# Patient Record
Sex: Female | Born: 1950 | Race: White | Hispanic: No | Marital: Married | Smoking: Never smoker
Health system: Southern US, Community
[De-identification: ages and names within clinical notes are randomized; demographics above are authoritative.]

## PROBLEM LIST (undated history)

## (undated) DIAGNOSIS — N950 Postmenopausal bleeding: Secondary | ICD-10-CM

## (undated) DIAGNOSIS — G40909 Epilepsy, unspecified, not intractable, without status epilepticus: Secondary | ICD-10-CM

## (undated) DIAGNOSIS — S7291XA Unspecified fracture of right femur, initial encounter for closed fracture: Secondary | ICD-10-CM

## (undated) DIAGNOSIS — F4323 Adjustment disorder with mixed anxiety and depressed mood: Secondary | ICD-10-CM

## (undated) DIAGNOSIS — Z9189 Other specified personal risk factors, not elsewhere classified: Secondary | ICD-10-CM

## (undated) DIAGNOSIS — H8101 Meniere's disease, right ear: Secondary | ICD-10-CM

## (undated) DIAGNOSIS — H269 Unspecified cataract: Secondary | ICD-10-CM

## (undated) DIAGNOSIS — R899 Unspecified abnormal finding in specimens from other organs, systems and tissues: Secondary | ICD-10-CM

## (undated) DIAGNOSIS — Z860101 Personal history of adenomatous and serrated colon polyps: Secondary | ICD-10-CM

## (undated) DIAGNOSIS — N9489 Other specified conditions associated with female genital organs and menstrual cycle: Secondary | ICD-10-CM

## (undated) DIAGNOSIS — H8109 Meniere's disease, unspecified ear: Secondary | ICD-10-CM

## (undated) DIAGNOSIS — M1990 Unspecified osteoarthritis, unspecified site: Secondary | ICD-10-CM

## (undated) DIAGNOSIS — Z86010 Personal history of colonic polyps: Secondary | ICD-10-CM

## (undated) DIAGNOSIS — Z9889 Other specified postprocedural states: Secondary | ICD-10-CM

## (undated) DIAGNOSIS — D219 Benign neoplasm of connective and other soft tissue, unspecified: Secondary | ICD-10-CM

## (undated) HISTORY — PX: BREAST CYST EXCISION: SHX579

## (undated) HISTORY — PX: COLONOSCOPY: SHX174

## (undated) HISTORY — PX: POLYPECTOMY: SHX149

## (undated) HISTORY — DX: Other specified personal risk factors, not elsewhere classified: Z91.89

## (undated) HISTORY — DX: Unspecified cataract: H26.9

## (undated) HISTORY — DX: Other hemochromatosis: E83.118

## (undated) HISTORY — DX: Personal history of colonic polyps: Z86.010

## (undated) HISTORY — PX: LIVER BIOPSY: SHX301

## (undated) HISTORY — PX: OTHER SURGICAL HISTORY: SHX169

## (undated) HISTORY — DX: Epilepsy, unspecified, not intractable, without status epilepticus: G40.909

## (undated) HISTORY — DX: Unspecified abnormal finding in specimens from other organs, systems and tissues: R89.9

## (undated) HISTORY — DX: Unspecified fracture of right femur, initial encounter for closed fracture: S72.91XA

## (undated) HISTORY — DX: Unspecified osteoarthritis, unspecified site: M19.90

## (undated) HISTORY — PX: OVARIAN CYST REMOVAL: SHX89

## (undated) HISTORY — DX: Adjustment disorder with mixed anxiety and depressed mood: F43.23

## (undated) HISTORY — DX: Gilbert syndrome: E80.4

## (undated) HISTORY — DX: Other specified postprocedural states: Z98.890

## (undated) HISTORY — DX: Benign neoplasm of connective and other soft tissue, unspecified: D21.9

## (undated) HISTORY — DX: Personal history of adenomatous and serrated colon polyps: Z86.0101

---
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
## 1990-06-09 ENCOUNTER — Encounter: Payer: Self-pay | Admitting: Internal Medicine

## 1990-06-18 ENCOUNTER — Encounter: Payer: Self-pay | Admitting: Internal Medicine

## 2000-07-17 ENCOUNTER — Encounter: Payer: Self-pay | Admitting: Internal Medicine

## 2001-01-21 DIAGNOSIS — S7291XA Unspecified fracture of right femur, initial encounter for closed fracture: Secondary | ICD-10-CM

## 2001-01-21 HISTORY — DX: Unspecified fracture of right femur, initial encounter for closed fracture: S72.91XA

## 2001-06-25 ENCOUNTER — Encounter: Payer: Self-pay | Admitting: Internal Medicine

## 2003-01-11 ENCOUNTER — Encounter: Payer: Self-pay | Admitting: Internal Medicine

## 2003-08-31 ENCOUNTER — Encounter: Payer: Self-pay | Admitting: Internal Medicine

## 2003-12-13 ENCOUNTER — Encounter: Payer: Self-pay | Admitting: Internal Medicine

## 2003-12-28 ENCOUNTER — Encounter: Payer: Self-pay | Admitting: Internal Medicine

## 2003-12-30 ENCOUNTER — Encounter: Payer: Self-pay | Admitting: Internal Medicine

## 2004-07-06 ENCOUNTER — Encounter: Payer: Self-pay | Admitting: Internal Medicine

## 2005-01-17 ENCOUNTER — Encounter: Payer: Self-pay | Admitting: Internal Medicine

## 2005-02-10 ENCOUNTER — Encounter: Payer: Self-pay | Admitting: Internal Medicine

## 2005-04-12 ENCOUNTER — Encounter: Payer: Self-pay | Admitting: Internal Medicine

## 2005-08-06 ENCOUNTER — Encounter: Payer: Self-pay | Admitting: Internal Medicine

## 2005-09-10 ENCOUNTER — Encounter: Payer: Self-pay | Admitting: Internal Medicine

## 2005-09-23 ENCOUNTER — Encounter: Payer: Self-pay | Admitting: Internal Medicine

## 2005-10-31 ENCOUNTER — Encounter: Payer: Self-pay | Admitting: Internal Medicine

## 2007-05-29 ENCOUNTER — Ambulatory Visit: Payer: Self-pay | Admitting: Internal Medicine

## 2007-05-29 DIAGNOSIS — K760 Fatty (change of) liver, not elsewhere classified: Secondary | ICD-10-CM | POA: Insufficient documentation

## 2007-05-29 DIAGNOSIS — N63 Unspecified lump in unspecified breast: Secondary | ICD-10-CM | POA: Insufficient documentation

## 2007-05-29 DIAGNOSIS — Z860100 Personal history of colon polyps, unspecified: Secondary | ICD-10-CM | POA: Insufficient documentation

## 2007-05-29 DIAGNOSIS — D1803 Hemangioma of intra-abdominal structures: Secondary | ICD-10-CM | POA: Insufficient documentation

## 2007-05-29 DIAGNOSIS — R945 Abnormal results of liver function studies: Secondary | ICD-10-CM | POA: Insufficient documentation

## 2007-05-29 DIAGNOSIS — D259 Leiomyoma of uterus, unspecified: Secondary | ICD-10-CM | POA: Insufficient documentation

## 2007-06-05 ENCOUNTER — Ambulatory Visit: Payer: Self-pay | Admitting: Family Medicine

## 2007-06-05 ENCOUNTER — Ambulatory Visit: Payer: Self-pay | Admitting: Internal Medicine

## 2007-06-05 LAB — CONVERTED CEMR LAB
Albumin: 4.3 g/dL (ref 3.5–5.2)
Alkaline Phosphatase: 85 units/L (ref 39–117)
BUN: 19 mg/dL (ref 6–23)
Basophils Relative: 0.6 % (ref 0.0–1.0)
Creatinine, Ser: 0.9 mg/dL (ref 0.4–1.2)
Direct LDL: 115.7 mg/dL
Eosinophils Relative: 2 % (ref 0.0–5.0)
GFR calc Af Amer: 83 mL/min
Glucose, Bld: 86 mg/dL (ref 70–99)
HCT: 37.8 % (ref 36.0–46.0)
Hemoglobin: 12.7 g/dL (ref 12.0–15.0)
INR: 0.9
MCV: 95.7 fL (ref 78.0–100.0)
Monocytes Absolute: 0.2 10*3/uL (ref 0.1–1.0)
Monocytes Relative: 6.2 % (ref 3.0–12.0)
Neutro Abs: 1.6 10*3/uL (ref 1.4–7.7)
Nitrite: NEGATIVE
Platelets: 187 10*3/uL (ref 150–400)
Potassium: 4.2 meq/L (ref 3.5–5.1)
Prothrombin Time: 11.6 s
RBC: 3.95 M/uL (ref 3.87–5.11)
Specific Gravity, Urine: 1.025
TSH: 0.88 microintl units/mL (ref 0.35–5.50)
Total CHOL/HDL Ratio: 2.8
Total Protein: 7.6 g/dL (ref 6.0–8.3)
Urobilinogen, UA: 0.2
WBC: 3.7 10*3/uL — ABNORMAL LOW (ref 4.5–10.5)

## 2007-06-09 ENCOUNTER — Telehealth: Payer: Self-pay | Admitting: *Deleted

## 2007-06-19 ENCOUNTER — Ambulatory Visit: Payer: Self-pay | Admitting: Internal Medicine

## 2007-06-19 ENCOUNTER — Other Ambulatory Visit: Admission: RE | Admit: 2007-06-19 | Discharge: 2007-06-19 | Payer: Self-pay | Admitting: Internal Medicine

## 2007-06-19 ENCOUNTER — Encounter: Admission: RE | Admit: 2007-06-19 | Discharge: 2007-06-19 | Payer: Self-pay | Admitting: Internal Medicine

## 2007-06-19 ENCOUNTER — Encounter: Payer: Self-pay | Admitting: Internal Medicine

## 2007-06-19 DIAGNOSIS — M810 Age-related osteoporosis without current pathological fracture: Secondary | ICD-10-CM | POA: Insufficient documentation

## 2007-06-26 ENCOUNTER — Encounter: Admission: RE | Admit: 2007-06-26 | Discharge: 2007-06-26 | Payer: Self-pay | Admitting: Internal Medicine

## 2007-07-13 ENCOUNTER — Telehealth: Payer: Self-pay | Admitting: Internal Medicine

## 2007-07-16 ENCOUNTER — Telehealth: Payer: Self-pay | Admitting: *Deleted

## 2007-08-03 ENCOUNTER — Ambulatory Visit: Payer: Self-pay | Admitting: Internal Medicine

## 2007-08-11 LAB — CONVERTED CEMR LAB
AFP-Tumor Marker: 15.3 ng/mL — ABNORMAL HIGH (ref 0.0–8.0)
Calcium, Total (PTH): 9.8 mg/dL (ref 8.4–10.5)

## 2007-08-17 ENCOUNTER — Encounter: Payer: Self-pay | Admitting: Internal Medicine

## 2007-10-26 ENCOUNTER — Ambulatory Visit: Payer: Self-pay | Admitting: Internal Medicine

## 2007-10-28 LAB — CONVERTED CEMR LAB
AFP-Tumor Marker: 17.3 ng/mL — ABNORMAL HIGH (ref 0.0–8.0)
Tissue Transglutaminase Ab, IgA: 0.1 U (ref <7)

## 2007-10-29 ENCOUNTER — Telehealth (INDEPENDENT_AMBULATORY_CARE_PROVIDER_SITE_OTHER): Payer: Self-pay

## 2007-11-06 ENCOUNTER — Ambulatory Visit (HOSPITAL_COMMUNITY): Admission: RE | Admit: 2007-11-06 | Discharge: 2007-11-06 | Payer: Self-pay | Admitting: Internal Medicine

## 2007-11-16 ENCOUNTER — Telehealth: Payer: Self-pay | Admitting: Internal Medicine

## 2008-01-22 HISTORY — PX: OTHER SURGICAL HISTORY: SHX169

## 2008-02-26 ENCOUNTER — Ambulatory Visit: Payer: Self-pay | Admitting: Internal Medicine

## 2008-02-26 DIAGNOSIS — R079 Chest pain, unspecified: Secondary | ICD-10-CM | POA: Insufficient documentation

## 2008-02-26 DIAGNOSIS — F4322 Adjustment disorder with anxiety: Secondary | ICD-10-CM | POA: Insufficient documentation

## 2008-02-26 DIAGNOSIS — D485 Neoplasm of uncertain behavior of skin: Secondary | ICD-10-CM | POA: Insufficient documentation

## 2008-04-25 ENCOUNTER — Telehealth: Payer: Self-pay | Admitting: Internal Medicine

## 2008-05-20 ENCOUNTER — Ambulatory Visit: Payer: Self-pay | Admitting: Internal Medicine

## 2008-05-20 LAB — CONVERTED CEMR LAB
Albumin: 4.3 g/dL (ref 3.5–5.2)
Alkaline Phosphatase: 77 units/L (ref 39–117)

## 2008-05-23 LAB — CONVERTED CEMR LAB: AFP-Tumor Marker: 14.6 ng/mL — ABNORMAL HIGH (ref 0.0–8.0)

## 2008-08-08 ENCOUNTER — Ambulatory Visit: Payer: Self-pay | Admitting: Internal Medicine

## 2008-08-19 ENCOUNTER — Encounter: Admission: RE | Admit: 2008-08-19 | Discharge: 2008-08-19 | Payer: Self-pay | Admitting: Internal Medicine

## 2008-08-26 ENCOUNTER — Encounter (INDEPENDENT_AMBULATORY_CARE_PROVIDER_SITE_OTHER): Payer: Self-pay

## 2008-09-02 ENCOUNTER — Telehealth: Payer: Self-pay | Admitting: Internal Medicine

## 2008-09-16 ENCOUNTER — Ambulatory Visit: Payer: Self-pay | Admitting: Internal Medicine

## 2008-09-16 ENCOUNTER — Observation Stay (HOSPITAL_COMMUNITY): Admission: AD | Admit: 2008-09-16 | Discharge: 2008-09-16 | Payer: Self-pay | Admitting: Internal Medicine

## 2008-09-16 ENCOUNTER — Encounter: Payer: Self-pay | Admitting: Emergency Medicine

## 2008-09-21 ENCOUNTER — Ambulatory Visit: Payer: Self-pay | Admitting: Internal Medicine

## 2008-09-21 DIAGNOSIS — M542 Cervicalgia: Secondary | ICD-10-CM | POA: Insufficient documentation

## 2008-09-28 LAB — CONVERTED CEMR LAB
Direct LDL: 137.8 mg/dL
Hgb A1c MFr Bld: 5.1 % (ref 4.6–6.5)
Total CHOL/HDL Ratio: 3

## 2008-10-07 ENCOUNTER — Ambulatory Visit: Payer: Self-pay | Admitting: Internal Medicine

## 2008-10-07 ENCOUNTER — Ambulatory Visit: Payer: Self-pay | Admitting: Cardiology

## 2008-10-07 DIAGNOSIS — E785 Hyperlipidemia, unspecified: Secondary | ICD-10-CM | POA: Insufficient documentation

## 2009-03-10 ENCOUNTER — Ambulatory Visit: Payer: Self-pay | Admitting: Internal Medicine

## 2009-04-14 ENCOUNTER — Telehealth: Payer: Self-pay | Admitting: Internal Medicine

## 2009-04-21 ENCOUNTER — Ambulatory Visit: Payer: Self-pay | Admitting: Internal Medicine

## 2009-04-21 LAB — CONVERTED CEMR LAB
Alkaline Phosphatase: 68 units/L (ref 39–117)
Bilirubin, Direct: 0.2 mg/dL (ref 0.0–0.3)
Total Bilirubin: 1.3 mg/dL — ABNORMAL HIGH (ref 0.3–1.2)
Total Protein: 7 g/dL (ref 6.0–8.3)

## 2009-05-03 ENCOUNTER — Ambulatory Visit: Payer: Self-pay | Admitting: Internal Medicine

## 2009-05-05 LAB — CONVERTED CEMR LAB
Chlamydia, Swab/Urine, PCR: NEGATIVE
GC Probe Amp, Urine: NEGATIVE

## 2009-06-09 ENCOUNTER — Encounter: Payer: Self-pay | Admitting: Internal Medicine

## 2009-07-05 ENCOUNTER — Ambulatory Visit: Payer: Self-pay | Admitting: Internal Medicine

## 2009-07-05 LAB — CONVERTED CEMR LAB
ALT: 27 units/L (ref 0–35)
Albumin: 4.9 g/dL (ref 3.5–5.2)
BUN: 21 mg/dL (ref 6–23)
Basophils Relative: 0.5 % (ref 0.0–3.0)
Bilirubin Urine: NEGATIVE
CO2: 31 meq/L (ref 19–32)
Chloride: 102 meq/L (ref 96–112)
Cholesterol: 232 mg/dL — ABNORMAL HIGH (ref 0–200)
Direct LDL: 121.8 mg/dL
Eosinophils Absolute: 0.1 10*3/uL (ref 0.0–0.7)
Eosinophils Relative: 2.1 % (ref 0.0–5.0)
Glucose, Urine, Semiquant: NEGATIVE
HCT: 37.4 % (ref 36.0–46.0)
Lymphs Abs: 1.6 10*3/uL (ref 0.7–4.0)
MCHC: 35.3 g/dL (ref 30.0–36.0)
MCV: 94.9 fL (ref 78.0–100.0)
Monocytes Absolute: 0.2 10*3/uL (ref 0.1–1.0)
Neutro Abs: 1.9 10*3/uL (ref 1.4–7.7)
Potassium: 4.8 meq/L (ref 3.5–5.1)
RBC: 3.94 M/uL (ref 3.87–5.11)
TSH: 0.97 microintl units/mL (ref 0.35–5.50)
Total CHOL/HDL Ratio: 3
Total Protein: 7.3 g/dL (ref 6.0–8.3)
VLDL: 13 mg/dL (ref 0.0–40.0)
WBC Urine, dipstick: NEGATIVE
WBC: 3.9 10*3/uL — ABNORMAL LOW (ref 4.5–10.5)
pH: 7

## 2009-07-11 ENCOUNTER — Other Ambulatory Visit: Admission: RE | Admit: 2009-07-11 | Discharge: 2009-07-11 | Payer: Self-pay | Admitting: Internal Medicine

## 2009-07-11 ENCOUNTER — Ambulatory Visit: Payer: Self-pay | Admitting: Internal Medicine

## 2009-07-11 DIAGNOSIS — R519 Headache, unspecified: Secondary | ICD-10-CM | POA: Insufficient documentation

## 2009-07-11 DIAGNOSIS — R51 Headache: Secondary | ICD-10-CM | POA: Insufficient documentation

## 2009-08-04 ENCOUNTER — Ambulatory Visit: Payer: Self-pay | Admitting: Internal Medicine

## 2009-08-04 ENCOUNTER — Encounter: Payer: Self-pay | Admitting: Internal Medicine

## 2009-09-08 ENCOUNTER — Encounter: Admission: RE | Admit: 2009-09-08 | Discharge: 2009-09-08 | Payer: Self-pay | Admitting: Internal Medicine

## 2009-11-08 ENCOUNTER — Ambulatory Visit: Payer: Self-pay | Admitting: Internal Medicine

## 2009-11-08 DIAGNOSIS — H9190 Unspecified hearing loss, unspecified ear: Secondary | ICD-10-CM | POA: Insufficient documentation

## 2009-11-09 ENCOUNTER — Encounter: Payer: Self-pay | Admitting: Internal Medicine

## 2009-11-20 ENCOUNTER — Encounter: Payer: Self-pay | Admitting: Internal Medicine

## 2010-02-10 ENCOUNTER — Encounter: Payer: Self-pay | Admitting: Otolaryngology

## 2010-02-20 NOTE — Progress Notes (Signed)
Summary: Does she need another lab?  Phone Note Call from Patient Call back at cell [phone]   Call For: Dr Leone Payor Summary of Call: Wonders if it's time for another lab for tumor marking. Her primary care asked her to call Dr Leone Payor to ask.  Wonders if we can call back today please? 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  Please advise Follow-up by: Darcey Nora RN, CGRN,  April 14, 2009 3:57 PM  Additional Follow-up for Phone Call Additional follow up Details #1::        she needs LFT's and alphafetoprotein dx 790.99 Iva Boop MD, [facility]  April 17, 2009 6:13 AM     Additional Follow-up for Phone Call Additional follow up Details #2::    Pt. will come for labs. Follow-up by: Teryl Lucy RN,  April 17, 2009 8:38 AM

## 2010-02-20 NOTE — Assessment & Plan Note (Signed)
Summary: EAR DISCOMFORT, H/A, TINNITUS // RS   Vital Signs:  Patient profile:   60 year old female Menstrual status:  postmenopausal Weight:      136 pounds Temp:     98.9 degrees F oral Pulse rate:   60 / minute BP sitting:   120 / 80  (left arm) Cuff size:   regular  Vitals Entered By: Romualdo Bolk, CMA (AAMA) (November 08, 2009 3:47 PM) CC: Pt is having rt ear ringing and hearing is distorted. This has been going on 10/12. Pt also have ha since 10/16. Pt has a knot on the bottom of her rt foot.  Hearing Screen  20db HL: Left  500 hz: 10db 1000 hz: 5db 2000 hz: 5db 4000 hz: 5db Right  500 hz: 55db 1000 hz: 45db 2000 hz: 10db 4000 hz: 10db   Hearing Testing Entered By: Romualdo Bolk, CMA Duncan Dull) (November 08, 2009 4:25 PM)   History of Present Illness: Nedra Mcinnis comes in today  for visit today  CO of  onset of significant decrease in hearing on right this week without fever  significant congestion or other obvious cause of popping in ears.   She states  she has a hs of sound sensisitivy and ocassinal pulse pain  in ear that is quite fleeting   x 2  and toff and on.   rx with tylenol and woudl resolve but in the past week has  ear felt funny and now has noise in ear tha is constant .  and distorted hearing .   like a white noise machine not piulatile  mostly constant. and hearing distorted  hard to use cell phone on the right ear . Marland Kitchen    Some HA for 3 days at times   .    better with coffee.  Noise  bothers her with quiet. .  Otherwise gets through her day. No balance problem or numbness   Preventive Screening-Counseling & Management  Alcohol-Tobacco     Alcohol drinks/day: <1     Smoking Status: never  Caffeine-Diet-Exercise     Caffeine use/day: 2     Does Patient Exercise: yes  Current Medications (verified): 1)  Fish Oil   Oil (Fish Oil) 2)  Oscal 500/200 D-3 500-200 Mg-Unit  Tabs (Calcium-Vitamin D) 3)  Alendronate Sodium 70 Mg Tabs (Alendronate  Sodium) .Marland Kitchen.. 1 By Mouth Q  Week   For Osteoporosis  Allergies (verified): 1)  ! Penicillin  Past History:  Past medical, surgical, family and social histories (including risk factors) reviewed, and no changes noted (except as noted below).  Past Medical History: Reviewed history from 07/11/2009 and no changes required. 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 C-Section  1982 Fracture R Femur 2003 running  out of shoes.    Fibroid Removed 1974 Spontaneously Abortion on 3rd Pg.  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 Family History of Colon CA 1st degree relative  father 28  died silent MI   Mom  with depresion anxiety  smoker   now dementia  high calcium   Brother ER  MD and has fatty liver and H63Dhemochromatosis  gene     Daughter also heterozygote for H63D  hemochromatosis, followed no symptom  for rest see data base  Mom deaf in one ear from Polio.  Father  had tinnitus   .      Social History: Reviewed history from 07/11/2009 and no changes required. Never Smoked Married   2 daughters separating   in counseling now.    husband a Rabbi moving  [place].    2 dogs  no tobacco  Alcohol use-yes ocasional 2-3 per month employed  [employer]   Daily Caffeine Use   Review of Systems  The patient denies anorexia, fever, weight loss, weight gain, vision loss, hoarseness, hemoptysis, abdominal pain, muscle weakness, transient blindness, difficulty  walking, abnormal bleeding, enlarged lymph nodes, and angioedema.    Physical Exam  General:  Well-developed,well-nourished,in no acute distress; alert,appropriate and cooperative throughout examination Head:  normocephalic and atraumatic.    no bruits hears Eyes:  vision grossly intact, pupils equal, and pupils round.   Ears:  L ear normal.  some wax in eac    right eac nl and tm appears normal  see hearing test    Nose:  no external deformity, no external erythema, and no nasal discharge.  non tender Mouth:  good dentition and pharynx pink and moist.   Neck:  No deformities, masses, or tenderness noted. no bruits  Lungs:  normal respiratory effort, no intercostal retractions, normal breath sounds, and no dullness.   Heart:  normal rate, regular rhythm, no murmur, no gallop, and no lifts.   Pulses:  pulses intact without delay   Neurologic:  non focal  except for hearing  facial symmetry good  Skin:  turgor normal, color normal, no suspicious lesions, and no ecchymoses.   Cervical Nodes:  No lymphadenopathy noted Psych:  Oriented X3, normally interactive, good eye contact, not anxious appearing, and not depressed appearing.     Impression & Recommendations:  Problem # 1:  DECREASED HEARING, RIGHT EAR (ICD-389.9) Assessment New recent onset  and sig on hearing screen compared to left .    needs ent evaluation  asap.    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  Orders: ENT Referral (ENT)  Complete Medication List: 1)  Fish Oil Oil (Fish oil) 2)  Oscal 500/200 D-3 500-200 Mg-unit Tabs (Calcium-vitamin d) 3)  Alendronate Sodium 70 Mg Tabs (Alendronate sodium) .Marland Kitchen.. 1 by mouth q  week   for osteoporosis  Patient Instructions: 1)  rec we get ent tocheck this as new onset    .  NO evidence of infection on todays exam or a vascular problem.  2)  Appt with Dr Jenne Pane tonmorrow 130   be there at 1:15  3)  copy of   hearing screen  to patient.   Orders Added: 1)  ENT Referral [ENT] 2)  Est. Patient Level IV [27782]

## 2010-02-20 NOTE — Therapy (Signed)
Summary: Audiology/Hearing Test  Audiology/Hearing Test   Imported By: Maryln Gottron 11/14/2009 09:40:19  _____________________________________________________________________  External Attachment:    Type:   Image     Comment:   External Document

## 2010-02-20 NOTE — Assessment & Plan Note (Signed)
Summary: med check//ccm   Vital Signs:  Patient profile:   60 year old female Menstrual status:  postmenopausal Weight:      135 pounds Temp:     99.0 degrees F oral Pulse rate:   72 / minute BP sitting:   120 / 80  (right arm) Cuff size:   regular  Vitals Entered By: Romualdo Bolk, CMA (AAMA) (March 10, 2009 1:47 PM) CC: Pt is here to discuss going on a antidepressant. Pt is going thru a divorce.   History of Present Illness: Donna Warren comes in today for above problem.   Separating  and family issues causing anxiety and depressive symptoms .  no panic attacks but impaired sleep and feeling stressed and depressed. Not hopeless or suicidal       . Tried  1/2 xanaxbut htis   gave her a HA   took x 1 .   in the  remote past  lexapro f had a side effect     an she felt " like cotton head.  "    Currently tends to be anxious and weepy at times  and thins she could benefit form medication    asks about paxil   .   to see a Veterinary surgeon .  Other hx.   Get has   in am     episodically this year.   Liver   ? follow up .  decided not to be [facility] but may still do this.   No Gi signs .     Preventive Screening-Counseling & Management  Alcohol-Tobacco     Alcohol drinks/day: <1     Smoking Status: never  Caffeine-Diet-Exercise     Caffeine use/day: 2     Does Patient Exercise: yes  Current Medications (verified): 1)  Fish Oil   Oil (Fish Oil) 2)  Oscal 500/200 D-3 500-200 Mg-Unit  Tabs (Calcium-Vitamin D) 3)  Alendronate Sodium 70 Mg Tabs (Alendronate Sodium) .Marland Kitchen.. 1 By Mouth Q  Week   For Osteoporosis  Allergies (verified): 1)  ! Penicillin  Past History:  Past medical, surgical, family and social histories (including risk factors) reviewed, and no changes noted (except as noted below).  Past Medical History: Reviewed history from 10/07/2008 and no changes required. Colonic polyps, adenoma per outside records (no path) Fatty liver  Heterozygosity for H63D  hemochromatosis gene Mild reactive depression  Uterine fibroids Osteoporosis dexa 2009 Gilbert's syndrome  Elelvated AFP  Hosp R/O  MI  CV  right chest and jaw pain  2010  Past Surgical History: Reviewed history from 10/07/2008 and no changes required. C-Section  1982 Fracture R Femur 2003 running  out of shoes.    Fibroid Removed 1974 Spontaneously Abortion on 3rd Pg.  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 Family History of Colon CA 1st degree relative  father 25  died silent MI   Mom  with depresion anxiety   smoker   now dementia  high calcium   Brother ER  MD and has fatty liver and H63Dhemochromatosis  gene     Daughter also heterozygote for H63D  hemochromatosis, followed no symptom  for rest see data base     Social History: Reviewed history from 02/26/2008 and no changes required. Never Smoked Married   2 daughters husband a Rabbi 2 dogs  no tobacco  Alcohol use-yes ocasional 2-3 per month employed  [employer]   Daily Caffeine Use   Review of Systems  The patient denies anorexia, fever, weight loss, weight gain, vision loss, abnormal bleeding, and enlarged lymph nodes.    Physical Exam  General:  alert, well-developed, well-nourished, and well-hydrated.   Head:  normocephalic.   Eyes:  vision grossly intact.   Skin:  turgor normal and color normal.   Psych:  Oriented X3, good eye contact, not anxious appearing, and not depressed appearing.  slighlty tearful at times.  Additional Exam:  reviewed record for last lfts afp    .    ? if April 2010   Impression & Recommendations:  Problem # 1:  ADJUSTMENT DISORDER WITH ANXIETY (ICD-309.24) counseled   and discuss   has  hx of se of lexapro in past and ? effexor .   thik paxil may have too many nurisance se.  will   use  sertraline  for now .  low dose increase as discussed.    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   she should contact Dr Teresita Madura office about getting repeat lft s and AFp and any other labs to follow .  Complete Medication List: 1)  Fish Oil Oil (Fish oil) 2)  Oscal 500/200 D-3 500-200 Mg-unit Tabs (Calcium-vitamin d) 3)  Alendronate Sodium 70 Mg Tabs (Alendronate sodium) .Marland Kitchen.. 1 by mouth q  week   for osteoporosis 4)  Sertraline Hcl 50 Mg Tabs (Sertraline hcl) .... 1/2 by mouth once daily for 1-2 weeks then increase to 1 by mouth once daily as tolerated  Patient Instructions: 1)  take med as directed   2)  rov in 3-4 weeks  or as needed.  3)  can arrange for any needed blood tests  per Dr Leone Payor here if calls.  Prescriptions: SERTRALINE HCL 50 MG TABS (SERTRALINE HCL) 1/2 by mouth once daily for 1-2 weeks then increase to 1 by mouth once daily as tolerated  #30 x 1   Entered and Authorized by:   Madelin Headings MD   Signed by:   Madelin Headings MD on 03/10/2009   Method used:   Electronically to        [id number] (retail)       [address]       [place], [place]  [address]       Ph: [phone] or [phone]       Fax: [phone]   RxID:   [id number]  greater than 50% of visit spent in counseling  25 minutes

## 2010-02-20 NOTE — Assessment & Plan Note (Signed)
Summary: CPX/PAP/NJR OK PER DOC/NJR   Vital Signs:  Patient profile:   60 year old female Menstrual status:  postmenopausal Height:      64 inches Weight:      136 pounds BMI:     23.43 Pulse rate:   72 / minute BP sitting:   100 / 60  (left arm) Cuff size:   regular  Vitals Entered By: Romualdo Bolk, CMA (AAMA) (July 11, 2009 10:40 AM) CC: CPX with pap   History of Present Illness: Donna Warren comes in today  for preventive visit . Since last visit  here  there have been no major changes in health status   She has seen specialist at [facility] opinion about her  isolated elevated AFP.     and because  stable  no monitoring needed. Weaned off sertraline.     and now off   for a month.  an dstill doing ok.   living separate in [place] counseling  stable. Bone  :   on fosomax . last dex 5 2009 . NO fractures .  Preventive Care Screening  Prior Values:    Mammogram:  ASSESSMENT: Negative - BI-RADS 1^MM DIGITAL SCREENING (08/19/2008)    Colonoscopy:  NORMAL  Indications: prior polyp (adenomatous? - no pathology), eldeerly father had colon cancer.  Dr. Levell July-  [facility]  [place] (08/[place])    Last Tetanus Booster:  Tdap (06/19/2007)   Preventive Screening-Counseling & Management  Alcohol-Tobacco     Alcohol drinks/day: <1     Smoking Status: never  Caffeine-Diet-Exercise     Caffeine use/day: 2     Does Patient Exercise: yes  Current Medications (verified): 1)  Fish Oil   Oil (Fish Oil) 2)  Oscal 500/200 D-3 500-200 Mg-Unit  Tabs (Calcium-Vitamin D) 3)  Alendronate Sodium 70 Mg Tabs (Alendronate Sodium) .Marland Kitchen.. 1 By Mouth Q  Week   For Osteoporosis  Allergies (verified): 1)  ! Penicillin  Past History:  Past medical, surgical, family and social histories (including risk factors) reviewed, and no changes noted (except as noted below).  Past Medical History: Colonic polyps, adenoma per outside records (no path) Fatty liver  Heterozygosity for H63D  hemochromatosis gene Mild reactive depression  Uterine fibroids Osteoporosis dexa 2009 -2.8  spine  -3.2 hi[ Gilbert's syndrome  Elelvated AFP  Hosp R/O  MI  CV  right chest and jaw pain  2010  Past Surgical History: Reviewed history from 10/07/2008 and no changes required. C-Section  1982 Fracture R Femur 2003 running  out of shoes.    Fibroid Removed 1974 Spontaneously Abortion on 3rd Pg.  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 Family History of Colon CA 1st degree relative  father 63  died silent MI   Mom  with depresion anxiety   smoker   now dementia  high calcium   Brother ER  MD and has fatty liver and H63Dhemochromatosis  gene     Daughter also heterozygote for H63D  hemochromatosis, followed no symptom  for rest see data base     Social History: Reviewed history from 05/03/2009 and no changes required. Never Smoked Married   2 daughters separating   in counseling now.    husband a Rabbi moving  [place].    2 dogs  no tobacco  Alcohol use-yes ocasional 2-3 per month  employed  [employer]   Daily Caffeine Use   Review of Systems  The patient denies anorexia, fever, weight loss, weight gain, vision loss, decreased hearing, hoarseness, chest pain, syncope, dyspnea on exertion, peripheral edema, prolonged cough, headaches, hemoptysis, abdominal pain, melena, hematochezia, severe indigestion/heartburn, hematuria, muscle weakness, suspicious skin lesions, transient blindness, difficulty walking, depression, unusual weight change, abnormal bleeding, enlarged lymph nodes, angioedema, and breast masses.         gets ocassional am had better after tylenol and her coffee ? upright . no assoicated .[place]  oncurring over 2 years .  no vision change or neuro assoicated signs    Physical Exam General Appearance: well developed, well nourished, no acute distress Eyes: conjunctiva and lids normal, PERRLA, EOMI, WNL Ears, Nose, Mouth, Throat: TM clear, nares clear, oral exam WNL Neck: supple, no lymphadenopathy, no thyromegaly, no JVD Respiratory: clear to auscultation and percussion, respiratory effort normal Cardiovascular: regular rate and rhythm, S1-S2, no murmur, rub or gallop, no bruits, peripheral pulses normal and symmetric, no cyanosis, clubbing, edema or varicosities Chest: no scars, masses, tenderness; no asymmetry, skin changes, nipple discharge   Gastrointestinal: soft, non-tender; no hepatosplenomegaly, masses; active bowel sounds all quadrants, guaiac negative stool; no masses, tenderness, hemorrhoids  Genitourinary: no vaginal discharge, lesions; no masses or tenderness Lymphatic: no cervical, axillary or inguinal adenopathy Musculoskeletal: gait normal, muscle tone and strength WNL, no joint swelling, effusions, discoloration, crepitus  Skin: clear, good turgor, color WNL, no rashes, lesions, or ulcerations Neurologic: normal mental status, normal reflexes, normal strength, sensation, and motion Psychiatric: alert; oriented to person, place and time Other Exam:  labs reviewed  wbc low normal rest nl     Impression & Recommendations:  Problem # 1:  PREVENTIVE HEALTH CARE (ICD-V70.0) Discussed nutrition,exercise,diet,healthy weight, vitamin D and calcium.    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 No further eval or monitoring needed unless change in symptom etc.  Problem # 4:  OSTEOPOROSIS (ICD-733.00) see dexa over 2 years ag rec follow up   disc  bisphosphates and uncertaintly of optimal rx length but she  has not been on  this 5 years yet.  and has fam hx and osteoporosis at a younger age.  Her updated medication list for this problem includes:    Oscal 500/200 D-3 500-200 Mg-unit Tabs (Calcium-vitamin d)    Alendronate Sodium 70 Mg Tabs (Alendronate sodium) .Marland Kitchen... 1 by mouth q  week   for osteoporosis  Problem # 5:  ADJUSTMENT DISORDER WITH ANXIETY (ICD-309.24) better  stable off med weaned for a month  ok to continue   Complete Medication List: 1)  Fish Oil Oil (Fish oil) 2)  Oscal 500/200 D-3 500-200 Mg-unit Tabs (Calcium-vitamin d) 3)  Alendronate Sodium 70 Mg Tabs (Alendronate sodium) .Marland Kitchen.. 1 by mouth q  week   for osteoporosis  Patient Instructions: 1)  DEXA scan. 2)  Stay on fosamax for now. 3)  yearly  check up or as needed.

## 2010-02-20 NOTE — Consult Note (Signed)
Summary: [facility]  [facility]   Imported By: Maryln Gottron 11/15/2009 10:09:31  _____________________________________________________________________  External Attachment:    Type:   Image     Comment:   External Document

## 2010-02-20 NOTE — Letter (Signed)
Summary: [facility]  [facility]   Imported By: Maryln Gottron 11/24/2009 12:30:40  _____________________________________________________________________  External Attachment:    Type:   Image     Comment:   External Document

## 2010-02-20 NOTE — Consult Note (Signed)
Summary: [facility]  [facility]   Imported By: Maryln Gottron 06/27/2009 14:00:24  _____________________________________________________________________  External Attachment:    Type:   Image     Comment:   External Document

## 2010-02-20 NOTE — Miscellaneous (Signed)
Summary: BONE DENSITY  Clinical Lists Changes  Orders: Added new Test order of T-Bone Densitometry (77080) - Signed Added new Test order of T-Lumbar Vertebral Assessment (77082) - Signed 

## 2010-02-20 NOTE — Assessment & Plan Note (Signed)
Summary: consults ZO:XWRU and sexual health/cjr   Vital Signs:  Patient profile:   60 year old female Menstrual status:  postmenopausal Weight:      131 pounds Pulse rate:   78 / minute BP sitting:   130 / 70  (right arm) Cuff size:   regular  Vitals Entered By: Romualdo Bolk, CMA (AAMA) (May 03, 2009 11:05 AM) CC: Follow-up visit on meds, discuss afp results and also discuss STD testing.   History of Present Illness: Donna Warren comesin today for   above.  AFP still about the same  and to get a [facility] consult  to get opinion. Mood zoloft dry mouth but seem to help.  seeing a counselor.      Husband  having affair.  She has no symptom but would like a screen to be safe .  Overall doing better  .   Preventive Screening-Counseling & Management  Alcohol-Tobacco     Alcohol drinks/day: <1     Smoking Status: never  Caffeine-Diet-Exercise     Caffeine use/day: 2     Does Patient Exercise: yes  Current Medications (verified): 1)  Fish Oil   Oil (Fish Oil) 2)  Oscal 500/200 D-3 500-200 Mg-Unit  Tabs (Calcium-Vitamin D) 3)  Alendronate Sodium 70 Mg Tabs (Alendronate Sodium) .Marland Kitchen.. 1 By Mouth Q  Week   For Osteoporosis 4)  Sertraline Hcl 50 Mg Tabs (Sertraline Hcl) .Marland Kitchen.. 1 By Mouth Once Daily As Tolerated  Allergies (verified): 1)  ! Penicillin  Past History:  Past medical, surgical, family and social histories (including risk factors) reviewed, and no changes noted (except as noted below).  Past Medical History: Reviewed history from 10/07/2008 and no changes required. 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 C-Section  1982 Fracture R Femur 2003 running  out of  shoes.    Fibroid Removed 1974 Spontaneously Abortion on 3rd Pg.  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 Family History of Colon CA 1st degree relative  father 64  died silent MI   Mom  with depresion anxiety   smoker   now dementia  high calcium   Brother ER  MD and has fatty liver and H63Dhemochromatosis  gene     Daughter also heterozygote for H63D  hemochromatosis, followed no symptom  for rest see data base     Social History: Reviewed history from 02/26/2008 and no changes required. 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  Orders: Prescription Created Electronically [phone])  Problem #  2:  SCREENING EXAMINATION FOR VENEREAL DISEASE (ICD-V74.5) counseled about  testing  Orders: T-HIV Antibody  (Reflex) [phone]) T-RPR (Syphilis) [phone]) T-Chlamydia & GC Probe, Urine ([phone])  Problem # 3:  ELEVATED ALPHA FETO PROTEIN LEVEL (ICD-790.99) Assessment: Unchanged  Complete Medication List: 1)  Fish Oil Oil (Fish oil) 2)  Oscal 500/200 D-3 500-200 Mg-unit Tabs (Calcium-vitamin d) 3)  Alendronate Sodium 70 Mg Tabs (Alendronate sodium) .Marland Kitchen.. 1 by mouth q  week   for osteoporosis 4)   Sertraline Hcl 50 Mg Tabs (Sertraline hcl) .Marland Kitchen.. 1 by mouth once daily as tolerated  Patient Instructions: 1)  schedule   cpx  in 1-2 months  with pap. 2)  ( can make a slot except thursday)  3)  You will be informed of lab results when available.  Prescriptions: SERTRALINE HCL 50 MG TABS (SERTRALINE HCL) 1 by mouth once daily as tolerated  #30 x 6   Entered and Authorized by:   Madelin Headings MD   Signed by:   Madelin Headings MD on 05/03/2009   Method used:   Electronically to        [id number] (retail)       [address]       [place], [place]  [address]       Ph: [phone] or [phone]       Fax: [phone]   RxID:   [id number]

## 2010-04-28 LAB — POCT CARDIAC MARKERS
CKMB, poc: <1 ng/mL — ABNORMAL LOW (ref 1.0–8.0)
Myoglobin, poc: 56.2 ng/mL (ref 12–200)
Troponin i, poc: <0.05 ng/mL (ref 0.00–0.09)
Troponin i, poc: <0.05 ng/mL (ref 0.00–0.09)

## 2010-04-28 LAB — AMYLASE: Amylase: 94 U/L (ref 27–131)

## 2010-04-28 LAB — D-DIMER, QUANTITATIVE: D-Dimer, Quant: <0.22 ug/mL-FEU (ref 0.00–0.48)

## 2010-04-28 LAB — LIPASE, BLOOD: Lipase: 32 U/L (ref 11–59)

## 2010-04-28 LAB — COMPREHENSIVE METABOLIC PANEL
ALT: 41 U/L — ABNORMAL HIGH (ref 0–35)
Alkaline Phosphatase: 73 U/L (ref 39–117)
BUN: 15 mg/dL (ref 6–23)
CO2: 26 mEq/L (ref 19–32)
Calcium: 9.4 mg/dL (ref 8.4–10.5)
GFR calc non Af Amer: >60 mL/min (ref >60)
Glucose, Bld: 124 mg/dL — ABNORMAL HIGH (ref 70–99)
Sodium: 140 mEq/L (ref 135–145)

## 2010-04-28 LAB — POCT I-STAT, CHEM 8
BUN: 24 mg/dL — ABNORMAL HIGH (ref 6–23)
Calcium, Ion: 1.17 mmol/L (ref 1.12–1.32)
Chloride: 104 mEq/L (ref 96–112)
Creatinine, Ser: 1.1 mg/dL (ref 0.4–1.2)
TCO2: 26 mmol/L (ref 0–100)

## 2010-04-28 LAB — DIFFERENTIAL
Basophils Absolute: 0 10*3/uL (ref 0.0–0.1)
Basophils Relative: 0 % (ref 0–1)
Eosinophils Absolute: 0.1 10*3/uL (ref 0.0–0.7)
Eosinophils Relative: 1 % (ref 0–5)
Lymphocytes Relative: 30 % (ref 12–46)
Monocytes Absolute: 0.3 10*3/uL (ref 0.1–1.0)

## 2010-04-28 LAB — CK TOTAL AND CKMB (NOT AT ARMC)
CK, MB: 1.1 ng/mL (ref 0.3–4.0)
Relative Index: INVALID (ref 0.0–2.5)
Total CK: 75 U/L (ref 7–177)

## 2010-04-28 LAB — CARDIAC PANEL(CRET KIN+CKTOT+MB+TROPI)
CK, MB: 0.8 ng/mL (ref 0.3–4.0)
Relative Index: INVALID (ref 0.0–2.5)
Total CK: 58 U/L (ref 7–177)

## 2010-04-28 LAB — CBC
MCHC: 34.6 g/dL (ref 30.0–36.0)
RBC: 3.89 MIL/uL (ref 3.87–5.11)
RDW: 12.4 % (ref 11.5–15.5)

## 2010-04-28 LAB — TSH: TSH: 2.425 u[IU]/mL (ref 0.350–4.500)

## 2010-05-01 ENCOUNTER — Telehealth: Payer: Self-pay | Admitting: *Deleted

## 2010-05-01 NOTE — Telephone Encounter (Signed)
Per Dr. Fabian Sharp- dramamine but can make her drowsy. And Imodium if needed. Pt aware of this and will call us if she doesn't get better before the weekend.

## 2010-05-01 NOTE — Telephone Encounter (Signed)
Pt is in [place] and had GI virus with V/D and fever.  Feeling some better, and has stopped vomiting, but still has nausea and some diarrhea. What is the best otc med she can take?  No fever now.

## 2010-06-05 NOTE — H&P (Signed)
Donna Warren, Donna Warren NO.:  [id number]   MEDICAL RECORD NO.:  [id number]          PATIENT TYPE:  EMS   LOCATION:  ED                           FACILITY:  [facility]   PHYSICIAN:  Ramiro Harvest, MD    DATE OF BIRTH:  Apr 27, 1950   DATE OF ADMISSION:  09/16/2008  DATE OF DISCHARGE:                              HISTORY & PHYSICAL   PRIMARY CARE PHYSICIAN:  Dr. Fabian Sharp of [facility].   HISTORY OF PRESENT ILLNESS:  Donna Warren is a 60 year old white female  with history of osteoporosis, elevated AFP, who presents to the ED with  a midsternal to right-sided chest pressure occurring at rest, lasting  approximately 2 hours and improving.  The patient stated that she ate  dinner and then after dinner she started experiencing reoccurrence of  the chest pain which radiated to her right neck/jaw and felt like a  toothache and also some right shoulder/right shoulder blade pain.  The  patient took two full-dose aspirin, called her brother who is an ED  doctor and also told him of her symptoms.  The patient stated that she  was just not feeling right and as such presented to the ED.  The patient  denies any fevers.  No chills, no diaphoresis, no shortness of breath,  no palpitations, no cough, no emesis, no abdominal pain, no diarrhea, no  orthopnea, no paroxysmal nocturnal dyspnea, no constipation.  No focal  neurological symptoms.  In the ED, the patient's pain has gradually  improved.  Labs done in the ED showed point of care cardiac markers were  negative x2.  I-Stat 8 showed a glucose of 151, BUN of 24, otherwise was  within normal limits.  D-dimer less than 0.22.  EKG with normal sinus  rhythm.  We were called to admit the patient for further evaluation and  management.   ALLERGIES:  PENICILLIN CAUSES A RASH.   PAST MEDICAL HISTORY:  1. Osteoporosis.  2. Elevated AFP being followed per Dr. Leone Payor.  3. Elevated liver function tests being followed per Dr.  Leone Payor.   HOME MEDICATIONS:  1. Fosamax 70 mg p.o. q. weekly.  2. Fish oil, dose unknown.  3. Calcium, dose unknown.   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  She is married.  No  tobacco use.  No alcohol use.  No IV drug use.   FAMILY HISTORY:  Father deceased at age 30 from colon cancer.  Mother  alive at age 55 with multiple medical problems, including coronary  artery disease, hypertension, thyroid disease, CVA, TIA, dementia,  diabetes and osteoarthritis.  The patient has one brother who also has  elevated liver function tests as well, otherwise he is healthy.   REVIEW OF SYSTEMS:  As per HPI, otherwise negative.   PHYSICAL EXAMINATION:  VITAL SIGNS:  Temperature 98.0, blood pressure  133/80, pulse of 85, respirations 22.  Saturating 100%.  GENERAL:  Patient in no apparent distress.  HEENT:  Normocephalic, atraumatic.  Pupils equal, round and reactive to  light and accommodation.  Extraocular movements intact.  Oropharynx  is  clear.  No lesions, no exudates.  NECK:  Supple.  No lymphadenopathy.  RESPIRATORY:  Lungs are clear to auscultation bilaterally.  No wheezes,  no crackles, no rhonchi.  CARDIOVASCULAR:  Regular rate and rhythm.  No  murmurs, rubs or gallops.  Chest wall is nontender to palpation.  ABDOMEN:  Soft, nontender, nondistended.  Positive bowel sounds.  EXTREMITIES:  No clubbing, cyanosis or edema.  NEUROLOGICAL:  The patient is alert and oriented x3.  Cranial nerves II-  XII are grossly intact.  No focal deficits.   ADMISSION LABORATORY DATA:  Point of care cardiac markers; CK-MB less  than 1.0, troponin-I less than 0.05, myoglobin of 47.5.  Next set showed  a CK-MB less than 1.0, troponin-I less than 0.05, myoglobin of 56.2.  I-  Stat 8 with a sodium of 140, potassium 3.8, chloride 104, glucose 151,  BUN 24, creatinine of 1.1.  CBC with a white count of 6.7, hemoglobin  12.8, hematocrit 37.0, platelet count of 208 and ANC of 4.3.  D-dimer  less  than 0.22.  Chest x-ray done showed no acute cardiopulmonary  process seen.  EKG shows a normal sinus rhythm.   ASSESSMENT AND PLAN:  [name] is a 60 year old female with a  history of osteoporosis, who presented to the emergency department with  chest pain.   1. Chest pain.  Differential includes acute coronary syndrome versus      gastrointestinal (cholecystitis).  D-dimer is negative and thus low      pretest probability for pulmonary edema.  The patient is low-risk.      Chest x-ray was negative for infiltrate.  We will admit the patient      to telemetry, cycle cardiac enzymes q.8 h., x3, check a TSH , check      a BNP, check a lipase, check amylase, check a 2-D echo, check a      CMET.  Blood pressure in both arms are within normal limits.  Blood      pressure in the left arm was 124/76, in the right and 120/75.      Dissection is also less likely in a patient with normal blood      pressures, will follow.  May need a cardiology consult for further      evaluation and recommendations and inpatient versus outpatient      stress test.  We will continue home dose oxygen, aspirin, morphine      sulfate and nitroglycerin.  2.  Osteoporosis, stable.  2. Prophylaxis.  Protonix for gastrointestinal prophylaxis.  Lovenox      for deep venous thrombosis prophylaxis.   It has been a pleasure taking care of Ms. Donna Warren.      Ramiro Harvest, MD  Electronically Signed     DT/MEDQ  D:  09/16/2008  T:  09/16/2008  Job:  [id number]   cc:   Neta Mends. Fabian Sharp, MD  [address] [place], [place] [address]

## 2010-06-05 NOTE — Discharge Summary (Signed)
Donna Warren, Donna Warren                ACCOUNT NO.:  [id number]   MEDICAL RECORD NO.:  [id number]          PATIENT TYPE:  OBV   LOCATION:  3709                         FACILITY:  [facility]   PHYSICIAN:  Rosalyn Gess. Norins, MD  DATE OF BIRTH:  [birth year]   DATE OF ADMISSION:  09/16/2008  DATE OF DISCHARGE:  09/16/2008                               DISCHARGE SUMMARY   ADMITTING DIAGNOSIS:  Chest pain, rule out myocardial infarction.   DISCHARGE DIAGNOSIS:  Chest pain, rule out myocardial infarction.   CONSULTANTS:  None.   PROCEDURES:  Two-view chest x-ray performed on day of admission which  showed no acute cardiopulmonary process.   HISTORY OF PRESENT ILLNESS:  The patient is a 60 year old woman with a  history of osteoporosis, elevated alpha-fetoprotein who presented to the  emergency department with midsternal to right-sided chest pressure  occurring at rest and worse with deep inspiration.  It had been going on  for possibly 2 hours.  She reports that this started after dinner.  She  reports there was some radiation to her neck and jaw, felt like a  toothache and also some right shoulder pain.  The patient took 2  aspirin, called her brother who is an emergency department physician.  He recommended she presented to the emergency department which she did.  The patient had no chills or diaphoresis.  No shortness of breath or  palpitations.  No cough.  No emesis.  No abdominal pain.  No diarrhea.  No orthopnea.  No PND.  No constipation.  No focal neurologic symptoms.  The patient does have a history of mild neck injury, so that when she  has hyperextension of her neck, she has had pain that radiates to her  scapula into her neck.   CARDIAC RISK PROFILE:  The patient has negative factors with no  diabetes, no hypertension, no hyperlipidemia.  She is not obese.  She  exercises on a regular basis.  Negative family history for heart  disease.   In the emergency department, the patient had  a glucose of 151, BUN of  24, otherwise her labs were normal.  Cardiac markers in the emergency  department were negative x2.  D-dimer was negative.  EKG was  unremarkable with a sinus rhythm.  The patient was subsequently admitted  for rule out protocol.   Please see the H and P for past medical history, family history, and  social history.  Examination at admission was well documented and is  basically unremarkable with lungs being clear.  Cardiovascular exam  being unremarkable.   HOSPITAL COURSE:  The patient was admitted to a telemetry floor where  her tracings were all normal.  In addition to the point of care, cardiac  enzymes in the emergency department, the patient did have cardiac panel  drawn at 12:45, which came back with a CK of 60, troponin of 0.01.  Second panel came back with a CK of 58, troponin of 0.02.  Beta-  natriuretic peptide was normal at 34.  Lipase was normal at 32.  With  the patient having very  low cardiac risk profile with a normal EKG,  normal telemetry, and negative cardiac enzymes, she is ruled out for any  cardiac event and is stable for discharge home.   DISCHARGE PHYSICAL EXAMINATION:  VITAL SIGNS:  Temperature was 98, blood  pressure 101/58, heart rate 70, respirations 20, O2 sats 99% on room  air.  GENERAL APPEARANCE:  A well-nourished, slender woman looks as her stated  age, in no acute distress.  HEENT:  Unremarkable.  CHEST:  The patient has tenderness to palpation in the intercostal  spaces along the right chest just lateral to the sternum.  LUNGS:  Clear with no rales, wheezes, or rhonchi.  CARDIOVASCULAR:  Radial pulse 2+.  She had no JVD or carotid bruits.  She had a regular rate and rhythm without murmurs, rubs, or gallops.  ABDOMEN:  Soft.  No guarding or rebound.  No tenderness in the right  upper quadrant.   DISPOSITION:  The patient is discharged home.  The patient will follow  up with Dr. Berniece Andreas in regards to appropriate  followup as an  outpatient in regards to cardiac risk stratification.  She also will  work with Dr. Fabian Sharp in regards to evaluation of her cervical and upper  thoracic spine in regards to any potential injury.   DISCHARGE MEDICATIONS:  The patient will continue her home medications  including Fosamax 70 mg weekly, calcium, and fish oil.   CONDITION:  The patient's condition at time of discharge dictation is  stable with no evidence of cardiac disease.      Rosalyn Gess Norins, MD  Electronically Signed     MEN/MEDQ  D:  09/16/2008  T:  09/17/2008  Job:  [id number]   cc:   Neta Mends. Fabian Sharp, MD

## 2010-06-18 ENCOUNTER — Other Ambulatory Visit: Payer: Self-pay | Admitting: Internal Medicine

## 2010-07-06 ENCOUNTER — Encounter: Payer: Self-pay | Admitting: Internal Medicine

## 2010-07-30 ENCOUNTER — Encounter: Payer: Self-pay | Admitting: Internal Medicine

## 2010-07-30 ENCOUNTER — Ambulatory Visit (AMBULATORY_SURGERY_CENTER): Payer: BC Managed Care – PPO | Admitting: *Deleted

## 2010-07-30 DIAGNOSIS — Z860100 Personal history of colon polyps, unspecified: Secondary | ICD-10-CM

## 2010-07-30 DIAGNOSIS — Z800 Family history of malignant neoplasm of digestive organs: Secondary | ICD-10-CM

## 2010-07-30 MED ORDER — PEG-KCL-NACL-NASULF-NA ASC-C 100 G PO SOLR: 1.0000 | Freq: Once | ORAL | Status: DC

## 2010-08-13 ENCOUNTER — Encounter: Payer: Self-pay | Admitting: Internal Medicine

## 2010-08-13 ENCOUNTER — Ambulatory Visit (AMBULATORY_SURGERY_CENTER): Payer: BC Managed Care – PPO | Admitting: Internal Medicine

## 2010-08-13 VITALS — BP 112/70 | HR 77 | Temp 99.1°F | Resp 20 | Ht 64.0 in | Wt 140.0 lb

## 2010-08-13 DIAGNOSIS — Z86010 Personal history of colonic polyps: Secondary | ICD-10-CM

## 2010-08-13 DIAGNOSIS — Z800 Family history of malignant neoplasm of digestive organs: Secondary | ICD-10-CM

## 2010-08-13 DIAGNOSIS — K648 Other hemorrhoids: Secondary | ICD-10-CM

## 2010-08-13 DIAGNOSIS — C228 Malignant neoplasm of liver, primary, unspecified as to type: Secondary | ICD-10-CM

## 2010-08-13 DIAGNOSIS — Z1211 Encounter for screening for malignant neoplasm of colon: Secondary | ICD-10-CM

## 2010-08-13 MED ORDER — SODIUM CHLORIDE 0.9 % IV SOLN: 500.0000 mL | INTRAVENOUS | Status: DC

## 2010-08-13 NOTE — Patient Instructions (Addendum)
No polyps or cancer seen today. You do have internal hemorrhoids. I recommend a repeat colonoscopy on a routine basis in 7 years (no polyps since 2002 and father was elderly when colon cancer diagnosed. Please read the information below and the other written materials provided.  Hemorrhoids Hemorrhoids are dilated (enlarged) veins around the rectum. Sometimes clots will form in the veins. This makes them swollen and painful. These are called thrombosed hemorrhoids. Causes of hemorrhoids include:  Pregnancy: this increases the pressure in the hemorrhoidal veins.   Constipation.   Straining to have a bowel movement.  HOME CARE INSTRUCTIONS  Eat a well balanced diet and drink 6 to 8 glasses of water/fluid every day to avoid constipation. You may also use a bulk laxative.   Avoid straining to have bowel movements.   Keep anal area dry and clean.   Only take over-the-counter or prescription medicines for pain, discomfort, or fever as directed by your caregiver.  If thrombosed:  Take hot sitz baths for 20 to 30 minutes, 3 to 4 times per day.   If the hemorrhoids are very tender and swollen, place ice packs on area as tolerated. Using ice packs between sitz baths may be helpful. Fill a plastic bag with ice and use a towel between the bag of ice and your skin.   Special creams and suppositories (Anusol, Nupercainal, Wyanoids) may be used or applied as directed.   Do not use a donut shaped pillow or sit on the toilet for long periods. This increases blood pooling and pain.   Move your bowels when your body has the urge; this will require less straining and will decrease pain and pressure.   Only take over-the-counter or prescription medicines for pain, discomfort, or fever as directed by your caregiver.  SEEK MEDICAL CARE IF:  You have increasing pain and swelling that is not controlled with your prescription.   You have uncontrolled bleeding.   You have an inability or difficulty  having a bowel movement.   You have pain or inflammation outside the area of the hemorrhoids.   You have chills and/or an oral temperature above 100.59F that lasts for 2 days or longer, or as your caregiver suggests.  MAKE SURE YOU:   Understand these instructions.   Will watch your condition.   Will get help right away if you are not doing well or get worse.  Document Released: 01/05/2000 Document Re-Released: 12/21/2007 [facility] Patient Information 2011 [place], [place].   Please review all discharge papers given to you by the recovery room nurse.  If you experience any problems after discharge please call [phone].  You will receive a phone call in the am from one of our nurses to see how you are doing and to answer any questions you may have.  Thank you

## 2010-08-14 ENCOUNTER — Telehealth: Payer: Self-pay | Admitting: *Deleted

## 2010-08-14 NOTE — Telephone Encounter (Signed)

## 2010-08-16 ENCOUNTER — Encounter: Payer: Self-pay | Admitting: Internal Medicine

## 2010-08-16 ENCOUNTER — Ambulatory Visit (INDEPENDENT_AMBULATORY_CARE_PROVIDER_SITE_OTHER): Payer: BC Managed Care – PPO | Admitting: Internal Medicine

## 2010-08-16 VITALS — BP 120/80 | Temp 98.3°F | Ht 64.0 in | Wt 143.0 lb

## 2010-08-16 DIAGNOSIS — I809 Phlebitis and thrombophlebitis of unspecified site: Secondary | ICD-10-CM | POA: Insufficient documentation

## 2010-08-16 DIAGNOSIS — L089 Local infection of the skin and subcutaneous tissue, unspecified: Secondary | ICD-10-CM | POA: Insufficient documentation

## 2010-08-16 MED ORDER — DOXYCYCLINE HYCLATE 100 MG PO TABS: 100.0000 mg | ORAL_TABLET | Freq: Two times a day (BID) | ORAL | Status: AC

## 2010-08-16 NOTE — Patient Instructions (Signed)
This  Could be  Superficial;l phlebitis that is getting infected. Warm compresses and antibiotic. Expect impovement in the next   3-5 days or so. Call if fever or getting worse.

## 2010-08-16 NOTE — Progress Notes (Signed)
  Subjective:    Patient ID: Donna Warren, female    DOB: 27-Dec-1950, 60 y.o.   MRN: [id number]  HPI  Patient comes in for acute visit. Had colonoscopy 3 days ago and had initial miss on IV site and some expected bruising around the top of hand . However this am had red swollen are on hand that is  Getting bigger over the past hours.    Comes in for check and advice .  No complications of  the colonoscopy . To return in 7 years  Well otherwise  Review of Systems Neg fever other rashes  hand pain    Past history family history social history reviewed in the electronic medical record.  Objective:   Physical Exam WDWN in nad  UE right hand with purplish fading bruising and entry  Iv site nl without streaking. Dark red ovoid swelling  Radial dorsal area   About 3-4 cm area No streaking  ROM of wrist is normal. NV ok      Assessment & Plan:  Prob superficial phlebitis with poss infection  Mild early  Empiric rx  With doxy and   Expectant management.

## 2010-10-02 ENCOUNTER — Other Ambulatory Visit: Payer: Self-pay | Admitting: Internal Medicine

## 2010-10-02 DIAGNOSIS — Z1231 Encounter for screening mammogram for malignant neoplasm of breast: Secondary | ICD-10-CM

## 2010-10-05 ENCOUNTER — Ambulatory Visit
Admission: RE | Admit: 2010-10-05 | Discharge: 2010-10-05 | Disposition: A | Discharge status: Home / Self Care | Payer: BC Managed Care – PPO | Source: Ambulatory Visit | Attending: Internal Medicine | Admitting: Internal Medicine

## 2010-10-05 DIAGNOSIS — Z1231 Encounter for screening mammogram for malignant neoplasm of breast: Secondary | ICD-10-CM

## 2010-10-11 ENCOUNTER — Other Ambulatory Visit: Payer: Self-pay | Admitting: Internal Medicine

## 2010-10-11 DIAGNOSIS — R928 Other abnormal and inconclusive findings on diagnostic imaging of breast: Secondary | ICD-10-CM

## 2010-10-12 ENCOUNTER — Telehealth: Payer: Self-pay | Admitting: Family Medicine

## 2010-10-12 NOTE — Telephone Encounter (Signed)
Called [facility] regarding Mammo and U/S order for this pt. They stated they would contact the pt to schedule.

## 2010-10-24 ENCOUNTER — Ambulatory Visit
Admission: RE | Admit: 2010-10-24 | Discharge: 2010-10-24 | Disposition: A | Discharge status: Home / Self Care | Payer: BC Managed Care – PPO | Source: Ambulatory Visit | Attending: Internal Medicine | Admitting: Internal Medicine

## 2010-10-24 DIAGNOSIS — R928 Other abnormal and inconclusive findings on diagnostic imaging of breast: Secondary | ICD-10-CM

## 2010-11-29 ENCOUNTER — Other Ambulatory Visit: Payer: Self-pay | Admitting: Internal Medicine

## 2011-02-26 ENCOUNTER — Other Ambulatory Visit: Payer: BC Managed Care – PPO

## 2011-02-28 ENCOUNTER — Other Ambulatory Visit (INDEPENDENT_AMBULATORY_CARE_PROVIDER_SITE_OTHER): Payer: BC Managed Care – PPO

## 2011-02-28 DIAGNOSIS — Z0000 Encounter for general adult medical examination without abnormal findings: Secondary | ICD-10-CM

## 2011-02-28 LAB — TSH: TSH: 1 u[IU]/mL (ref 0.35–5.50)

## 2011-02-28 LAB — CBC WITH DIFFERENTIAL/PLATELET
Basophils Absolute: 0 10*3/uL (ref 0.0–0.1)
Eosinophils Absolute: 0.1 10*3/uL (ref 0.0–0.7)
HCT: 37.8 % (ref 36.0–46.0)
Hemoglobin: 12.9 g/dL (ref 12.0–15.0)
Lymphs Abs: 1.8 10*3/uL (ref 0.7–4.0)
MCHC: 34.3 g/dL (ref 30.0–36.0)
MCV: 95.4 fl (ref 78.0–100.0)
Monocytes Absolute: 0.3 10*3/uL (ref 0.1–1.0)
Neutro Abs: 2.8 10*3/uL (ref 1.4–7.7)
RDW: 12.3 % (ref 11.5–14.6)

## 2011-02-28 LAB — LIPID PANEL
Cholesterol: 225 mg/dL — ABNORMAL HIGH (ref 0–200)
HDL: 83.9 mg/dL (ref >39.00)
VLDL: 9.8 mg/dL (ref 0.0–40.0)

## 2011-02-28 LAB — HEPATIC FUNCTION PANEL
AST: 25 U/L (ref 0–37)
Albumin: 4.4 g/dL (ref 3.5–5.2)

## 2011-02-28 LAB — BASIC METABOLIC PANEL
CO2: 30 mEq/L (ref 19–32)
Glucose, Bld: 94 mg/dL (ref 70–99)
Potassium: 4.5 mEq/L (ref 3.5–5.1)
Sodium: 140 mEq/L (ref 135–145)

## 2011-02-28 LAB — POCT URINALYSIS DIPSTICK
Bilirubin, UA: NEGATIVE
Glucose, UA: NEGATIVE
Ketones, UA: NEGATIVE
Nitrite, UA: NEGATIVE
Protein, UA: NEGATIVE
pH, UA: 5.5

## 2011-03-06 ENCOUNTER — Encounter: Payer: Self-pay | Admitting: Internal Medicine

## 2011-03-07 ENCOUNTER — Ambulatory Visit (INDEPENDENT_AMBULATORY_CARE_PROVIDER_SITE_OTHER): Payer: BC Managed Care – PPO | Admitting: Internal Medicine

## 2011-03-07 ENCOUNTER — Other Ambulatory Visit (HOSPITAL_COMMUNITY)
Admission: RE | Admit: 2011-03-07 | Discharge: 2011-03-07 | Disposition: A | Discharge status: Home / Self Care | Payer: BC Managed Care – PPO | Source: Ambulatory Visit | Attending: Internal Medicine | Admitting: Internal Medicine

## 2011-03-07 ENCOUNTER — Encounter: Payer: Self-pay | Admitting: Internal Medicine

## 2011-03-07 VITALS — BP 120/80 | HR 60 | Ht 64.0 in | Wt 143.0 lb

## 2011-03-07 DIAGNOSIS — Z01419 Encounter for gynecological examination (general) (routine) without abnormal findings: Secondary | ICD-10-CM | POA: Insufficient documentation

## 2011-03-07 DIAGNOSIS — R8290 Unspecified abnormal findings in urine: Secondary | ICD-10-CM | POA: Insufficient documentation

## 2011-03-07 DIAGNOSIS — Z9189 Other specified personal risk factors, not elsewhere classified: Secondary | ICD-10-CM | POA: Insufficient documentation

## 2011-03-07 DIAGNOSIS — M810 Age-related osteoporosis without current pathological fracture: Secondary | ICD-10-CM

## 2011-03-07 DIAGNOSIS — R51 Headache: Secondary | ICD-10-CM

## 2011-03-07 DIAGNOSIS — Z0000 Encounter for general adult medical examination without abnormal findings: Secondary | ICD-10-CM | POA: Insufficient documentation

## 2011-03-07 DIAGNOSIS — Z91B Personal risk factor of exposure to diethylstilbestrol: Secondary | ICD-10-CM

## 2011-03-07 DIAGNOSIS — R82998 Other abnormal findings in urine: Secondary | ICD-10-CM

## 2011-03-07 MED ORDER — ALENDRONATE SODIUM 70 MG PO TABS: 70.0000 mg | ORAL_TABLET | ORAL | Status: DC

## 2011-03-07 NOTE — Assessment & Plan Note (Signed)
More frequent and at night  Better with getting up and around and sometimes caffiene med or such . No known bp issue or associated sx  Episodic no other sx  And resolved.  Will get HA calandar  follow up of  persistent or progressive consider other eval.

## 2011-03-07 NOTE — Progress Notes (Signed)
Subjective:    Patient ID: Donna Warren, female    DOB: 1950-02-26, 61 y.o.   MRN: [id number]  HPI  Patient comes in today for Preventive Health Care visit  And fu of   Medical Issues  Since last check is doing ok. No major  Changes. Bpone health on fosamax.  To discuss  Headaches  At night  Off and on  And resolves with upright and excedrin or tylenol. No assoi sx   A couple of times a week?   Has uri no fever  Ear : Dec hearing.   Coping  ? If getting better.   See prev evalu with ent and MRI head end of 2011 Review of Systems ROS:  GEN/ HEENT: No fever, significant weight changes sweats vision  changes, CV/ PULM; No chest pain shortness of breath cough, syncope,edema  change in exercise tolerance. GI /GU: No adominal pain, vomiting, change in bowel habits. No blood in the stool. No significant GU symptoms. SKIN/HEME: ,no acute skin rashes suspicious lesions or bleeding. No lymphadenopathy, nodules, masses.  NEURO/ PSYCH:  No neurologic signs such as weakness numbness. No depression anxiety. IMM/ Allergy: No unusual infections.  Allergy .   REST of 12 system review negative except as per HPI Outpatient Encounter Prescriptions as of 03/07/2011  Medication Sig Dispense Refill  . alendronate (FOSAMAX) 70 MG tablet 1 tab once a week.      . Calcium Carb-Cholecalciferol (CALCIUM PLUS VITAMIN D3 PO) Take 2 tablets by mouth daily.        . fish oil-omega-3 fatty acids 1000 MG capsule Take 2 g by mouth daily.        . MULTIPLE VITAMIN PO Take by mouth.      . DISCONTD: alendronate (FOSAMAX) 70 MG tablet 1 tab once a week. Pt needs to schedule a follow up appt before next refill.  4 tablet  0   Facility-Administered Encounter Medications as of 03/07/2011  Medication Dose Route Frequency Provider Last Rate Last Dose  . DISCONTD: 0.9 %  sodium chloride infusion  500 mL Intravenous Continuous Iva Boop, MD        Past history family history social history reviewed in the electronic medical  record. And  Updated       Objective:   Physical Exam Physical Exam: Vital signs reviewed WUJ:[id number] is a well-developed well-nourished alert cooperative  white female who appears her stated age in no acute distress.  Runny nose  HEENT: normocephalic atraumatic , Eyes: PERRL EOM's full, conjunctiva clear, Nares: paten,t no deformityor tenderness., clear discharge  Ears: no deformity EAC's clear TMs with normal landmarks. Mouth: clear OP, no lesions, edema.  Moist mucous membranes. Dentition in adequate repair. NECK: supple without masses, thyromegaly or bruits. CHEST/PULM:  Clear to auscultation and percussion breath sounds equal no wheeze , rales or rhonchi. No chest wall deformities or tenderness. CV: PMI is nondisplaced, S1 S2 no gallops, murmurs, rubs. Peripheral pulses are full without delay.No JVD .  Breast: normal by inspection . No dimpling, discharge, masses, tenderness or discharge .  ABDOMEN: Bowel sounds normal nontender  No guard or rebound, no hepato splenomegal no CVA tenderness.  No hernia. Extremtities:  No clubbing cyanosis or edema, no acute joint swelling or redness no focal atrophy NEURO:  Oriented x3, cranial nerves 3-12 appear to be intact, no obvious focal weakness,gait within normal limits no abnormal reflexes or asymmetrical SKIN: No acute rashes normal turgor, color, no bruising or petechiae. PSYCH: Oriented, good eye  contact, no obvious depression anxiety, cognition and judgment appear normal. LN: no cervical axillary inguinal adenopathy Pelvic: NL ext GU, labia clear without lesions or rash . Vagina no lesions .Cervix: clear  UTERUS: Neg CMT Adnexa:  clear no masses . PAP done rectal no masses      Lab Results  Component Value Date   WBC 5.0 02/28/2011   HGB 12.9 02/28/2011   HCT 37.8 02/28/2011   PLT 225.0 02/28/2011   GLUCOSE 94 02/28/2011   CHOL 225* 02/28/2011   TRIG 49.0 02/28/2011   HDL 83.90 02/28/2011   LDLDIRECT 124.3 02/28/2011   ALT 35 02/28/2011   AST 25  02/28/2011   NA 140 02/28/2011   K 4.5 02/28/2011   CL 103 02/28/2011   CREATININE 1.0 02/28/2011   BUN 18 02/28/2011   CO2 30 02/28/2011   TSH 1.00 02/28/2011   INR .9 06/05/2007   HGBA1C 5.1 09/21/2008     Last dexa 2011   reviewed       Assessment & Plan:   Preventive Health Care Counseled regarding healthy nutrition, exercise, sleep, injury prevention, calcium vit d and healthy weight . PAP today .   Osteoporosis:  Continue meds and plan fu dexa in the summer ca  Vit d etc.  Headache: non focal exam  To fu if recurring  Counseled.  No other alarm features   URi: viral uncomplicated  Abd UA  Blood on ds   Repeat with micro.  After pt left  Reviewed record  At next blood test plan repeat vit d and pth and iron studies .

## 2011-03-07 NOTE — Patient Instructions (Addendum)
make sure  You are getting  1000 iu vit d  Per day.  And eqiv 1200 mg calcium  Would stay on the fosamax  As your last dexa was in 2011 and showed a t score of - 3 on hip.  Get urine test repeat at the [facility] office when well hydrated perhaps next week.  Unsure why your headaches are triggered  But  Consider  Triggers   And do HA diary for now.  If  persistent or progressive bring in calendar over 2-3 months to review  If this is progressive and severe we can get further evaluation.   Need DEXA bone density in July  Call for order .   If doing ok then  rov in 1 year or so.

## 2011-03-07 NOTE — Assessment & Plan Note (Signed)
Continue meds and parameters ca vit  Weight bearing plan dexa repeat in July or so. Call for order.  May consider vit d level pth if appropriate.

## 2011-03-12 ENCOUNTER — Other Ambulatory Visit (INDEPENDENT_AMBULATORY_CARE_PROVIDER_SITE_OTHER): Payer: BC Managed Care – PPO

## 2011-03-12 DIAGNOSIS — R82998 Other abnormal findings in urine: Secondary | ICD-10-CM

## 2011-03-12 DIAGNOSIS — M810 Age-related osteoporosis without current pathological fracture: Secondary | ICD-10-CM

## 2011-03-12 DIAGNOSIS — R8290 Unspecified abnormal findings in urine: Secondary | ICD-10-CM

## 2011-03-12 LAB — URINALYSIS, ROUTINE W REFLEX MICROSCOPIC
Bilirubin Urine: NEGATIVE
Ketones, ur: NEGATIVE
Leukocytes, UA: NEGATIVE
Specific Gravity, Urine: <=1.005 (ref 1.000–1.030)
Urobilinogen, UA: 0.2 (ref 0.0–1.0)

## 2011-03-13 LAB — IBC PANEL: Saturation Ratios: 20 % (ref 20.0–50.0)

## 2011-03-13 LAB — FERRITIN: Ferritin: 47.4 ng/mL (ref 10.0–291.0)

## 2011-03-20 ENCOUNTER — Encounter: Payer: Self-pay | Admitting: *Deleted

## 2011-03-20 NOTE — Progress Notes (Signed)
Quick Note:    Letter sent to pt.  ______

## 2011-03-20 NOTE — Progress Notes (Signed)
Quick Note:  Tell patient PAP is normal. Unsure if She got this result Or not. ______

## 2011-03-25 ENCOUNTER — Encounter: Payer: Self-pay | Admitting: Internal Medicine

## 2011-03-28 ENCOUNTER — Telehealth: Payer: Self-pay | Admitting: Internal Medicine

## 2011-03-28 NOTE — Telephone Encounter (Signed)
Pt would like shannon to return her call concerning labs results. Pt is aware shannon is out of office this afternoon.

## 2011-03-29 NOTE — Telephone Encounter (Signed)
Pt is wanting to know about the calcium level being too high and her having osteoporosis. She is concerned that the calcium is not getting to her bones. She is wanting to make she is taking the right amount of calcium1200mg  or is she taking too much and is this getting to her bones to help with the osteo?

## 2011-04-02 NOTE — Telephone Encounter (Signed)
Left a message for pt to return call 

## 2011-04-02 NOTE — Telephone Encounter (Signed)
Pt states she does not want to do the ionized calicum level at this time.

## 2011-04-02 NOTE — Telephone Encounter (Signed)
The calcium level elevation is borderline and may be related to how the blood was drawn. Note that it was normal the month before. Sometimes if the tourniquet is too tight and is on too long the calcium level will be factitiously elevated in the specimen.  Thus this may not be significant.  However to be sure we can define the situation and  order an ionized calcium level  which is a more specific test. This will tell us more information.   Schedule Non fasting labs and stay hydrated and will let her know results.

## 2011-04-02 NOTE — Telephone Encounter (Signed)
Pt aware.

## 2011-04-30 ENCOUNTER — Telehealth: Payer: Self-pay | Admitting: *Deleted

## 2011-04-30 MED ORDER — ALPRAZOLAM 0.25 MG PO TABS: 0.2500 mg | ORAL_TABLET | Freq: Three times a day (TID) | ORAL | Status: AC | PRN

## 2011-04-30 NOTE — Telephone Encounter (Signed)
Pls advise.  

## 2011-04-30 NOTE — Telephone Encounter (Signed)
Rx called in to pharmacy. 

## 2011-04-30 NOTE — Telephone Encounter (Signed)
Ok to rx: xanax .25 mg  disp 20 1 po tid prn anxiety  No refills .

## 2011-04-30 NOTE — Telephone Encounter (Signed)
(  triage voicemail)  Pt states Dr. Fabian Sharp gave her a rx for alprazalam .25 mg quite some time ago around Feb 2010.  Pt states she barely used and wants to know if she can get a new prescription called in to [facility] on  [address] since she is have some anxiety currently.

## 2011-06-12 ENCOUNTER — Telehealth: Payer: Self-pay | Admitting: Internal Medicine

## 2011-06-12 NOTE — Telephone Encounter (Signed)
Pt called and has sched and ov on Friday 06/21/11 re: depression. Pt req sooner appt. Probably will need a ov. Pls advise where to schd. Thanks.

## 2011-06-12 NOTE — Telephone Encounter (Signed)
Pt states she was on a medication at one point and is currently having a rough spell. Pt states she has not been on medication for a few years.  Pt states she is capable of waiting until Dr. Fabian Sharp returns to see if she could possibly be worked in sooner.  Pt states she would like to be worked in 06/19/11 if possible at noon.  Pls advise.

## 2011-06-13 NOTE — Telephone Encounter (Signed)
5 29 at noon is ok.  Please change her appt. time

## 2011-06-14 NOTE — Telephone Encounter (Signed)
Called pt to make aware that appt time and date had been changed to 06/19/11 at noon.  Left a detailed message for pt at personalized voicemail stating date and time of new appt.

## 2011-06-19 ENCOUNTER — Ambulatory Visit (INDEPENDENT_AMBULATORY_CARE_PROVIDER_SITE_OTHER): Payer: BC Managed Care – PPO | Admitting: Internal Medicine

## 2011-06-19 ENCOUNTER — Encounter: Payer: Self-pay | Admitting: Internal Medicine

## 2011-06-19 VITALS — BP 100/70 | HR 83 | Temp 98.5°F | Wt 135.0 lb

## 2011-06-19 DIAGNOSIS — F4323 Adjustment disorder with mixed anxiety and depressed mood: Secondary | ICD-10-CM

## 2011-06-19 DIAGNOSIS — H9190 Unspecified hearing loss, unspecified ear: Secondary | ICD-10-CM

## 2011-06-19 MED ORDER — DULOXETINE HCL 60 MG PO CPEP: 60.0000 mg | ORAL_CAPSULE | Freq: Every day | ORAL | Status: DC

## 2011-06-19 NOTE — Progress Notes (Signed)
  Subjective:    Patient ID: Donna Warren, female    DOB: Dec 19, 1950, 61 y.o.   MRN: [id number]  HPI Comes in for new problem evaluation.  6 months of  depressive sx  ,ocass use low dose xanax for anxiety but noting  Decrease enthusiasm and unsatisfied Husband in [place]. They are still separated but commuting on weekends. In past was on zoloft    ? If helps  But no remitted sx  Family hx of depression. Recent therapy plans Wants to avoid meds that could aggravate her tinnitus and ear issue  Such as wellbutrin.  Avoid tinnitus .  Concern about side effects. Thinks single ssri not that affective  Also weight issues. osteoporosis Walks 2 miles  4 x per week.  Review of Systems No cp sob fever syncope . suicidality hopelessness. Past history family history social history reviewed in the electronic medical record.     Objective:   Physical Exam BP 100/70  Pulse 83  Temp(Src) 98.5 F (36.9 C) (Oral)  Wt 135 lb (61.236 kg)  SpO2 99% Wt Readings from Last 3 Encounters:  06/19/11 135 lb (61.236 kg)  03/07/11 143 lb (64.864 kg)  08/16/10 143 lb (64.864 kg)  wdwn in nad Oriented x 3. Normal cognition, attention, speech. Not anxious    Good eye contact .  Mild mute of affect  Looks well.  Record review Last lfts are normal    Assessment & Plan:   Depressive sx  Adjustment vs dysthymia with fam hx Incomplete response to ssri in past. Disc options   cymbalta samples and  rx coupon  Unsure if insurance pays for each   Pos and cons with each in group of snri.  Will flag dr Leone Payor because of liver issues evaluated int he past but dont really see an contraindication for trial.   Plan fu in 3-4 weeks or   Prn Total visit 22 mins > 50% spent counseling and coordinating care

## 2011-06-19 NOTE — Patient Instructions (Signed)
Begin 30 mg per day  cymbalta and increase to 60 mg per day.  ROV in 3-4 weeks or as needed. Will let Dt gessner  Know.  You may need to check insurance  About coverage.

## 2011-06-21 ENCOUNTER — Ambulatory Visit: Payer: BC Managed Care – PPO | Admitting: Internal Medicine

## 2011-06-23 ENCOUNTER — Encounter: Payer: Self-pay | Admitting: Internal Medicine

## 2011-06-23 DIAGNOSIS — F4323 Adjustment disorder with mixed anxiety and depressed mood: Secondary | ICD-10-CM | POA: Insufficient documentation

## 2011-06-23 HISTORY — DX: Adjustment disorder with mixed anxiety and depressed mood: F43.23

## 2011-07-29 ENCOUNTER — Telehealth: Payer: Self-pay | Admitting: Internal Medicine

## 2011-07-29 ENCOUNTER — Other Ambulatory Visit: Payer: Self-pay | Admitting: Family Medicine

## 2011-07-29 DIAGNOSIS — M810 Age-related osteoporosis without current pathological fracture: Secondary | ICD-10-CM

## 2011-07-29 NOTE — Telephone Encounter (Signed)
Pt states she was supposed to call back in July and scheduled a DEXA scan. No order in system please advise

## 2011-07-29 NOTE — Telephone Encounter (Signed)
PT SCHEDULED

## 2011-07-29 NOTE — Telephone Encounter (Signed)
Order for dexa put into system.  Please have pt schedule.  Thanks!!!

## 2011-08-02 ENCOUNTER — Ambulatory Visit
Admission: RE | Admit: 2011-08-02 | Discharge: 2011-08-02 | Disposition: A | Discharge status: Home / Self Care | Payer: BC Managed Care – PPO | Source: Ambulatory Visit

## 2011-08-02 DIAGNOSIS — M810 Age-related osteoporosis without current pathological fracture: Secondary | ICD-10-CM

## 2011-08-09 ENCOUNTER — Ambulatory Visit (INDEPENDENT_AMBULATORY_CARE_PROVIDER_SITE_OTHER)
Admission: RE | Admit: 2011-08-09 | Discharge: 2011-08-09 | Disposition: A | Discharge status: Home / Self Care | Payer: BC Managed Care – PPO | Source: Ambulatory Visit | Attending: Internal Medicine | Admitting: Internal Medicine

## 2011-08-09 DIAGNOSIS — M810 Age-related osteoporosis without current pathological fracture: Secondary | ICD-10-CM

## 2011-08-13 ENCOUNTER — Ambulatory Visit (INDEPENDENT_AMBULATORY_CARE_PROVIDER_SITE_OTHER): Payer: BC Managed Care – PPO | Admitting: Internal Medicine

## 2011-08-13 ENCOUNTER — Encounter: Payer: Self-pay | Admitting: Internal Medicine

## 2011-08-13 VITALS — BP 104/70 | HR 90 | Temp 97.8°F | Wt 126.0 lb

## 2011-08-13 DIAGNOSIS — IMO0002 Reserved for concepts with insufficient information to code with codable children: Secondary | ICD-10-CM

## 2011-08-13 DIAGNOSIS — L989 Disorder of the skin and subcutaneous tissue, unspecified: Secondary | ICD-10-CM

## 2011-08-13 DIAGNOSIS — F4323 Adjustment disorder with mixed anxiety and depressed mood: Secondary | ICD-10-CM

## 2011-08-13 DIAGNOSIS — M810 Age-related osteoporosis without current pathological fracture: Secondary | ICD-10-CM

## 2011-08-13 DIAGNOSIS — T887XXA Unspecified adverse effect of drug or medicament, initial encounter: Secondary | ICD-10-CM

## 2011-08-13 DIAGNOSIS — S46911A Strain of unspecified muscle, fascia and tendon at shoulder and upper arm level, right arm, initial encounter: Secondary | ICD-10-CM | POA: Insufficient documentation

## 2011-08-13 MED ORDER — DULOXETINE HCL 30 MG PO CPEP: 30.0000 mg | ORAL_CAPSULE | Freq: Every day | ORAL | Status: DC

## 2011-08-13 NOTE — Patient Instructions (Addendum)
Shoulder  Exercises.  Ice after activity  advil aleve as needed. Expect improvement from strain after 6- 8 weeks.  If  persistent or progressive consider further evaluation sports medicine  Can try     Decrease dose of the cymbalta 30 mg alternating .  With 60  Can monitor with the PHQ9  You will  be notified of the final dexa report and continuing fosamax.   I suspect the skin are to be a dematofibroma or such but get derm to check when you go.   Fu in 4-6 months depending  on how you are doing or  When due for a check up.   Shoulder Range of Motion Exercises The shoulder is the most flexible joint in the human body. Because of this it is also the most unstable joint in the body. All ages can develop shoulder problems. Early treatment of problems is necessary for a good outcome. People react to shoulder pain by decreasing the movement of the joint. After a brief period of time, the shoulder can become "frozen". This is an almost complete loss of the ability to move the damaged shoulder. Following injuries your caregivers can give you instructions on exercises to keep your range of motion (ability to move your shoulder freely), or regain it if it has been lost.  EXERCISES EXERCISES TO MAINTAIN THE MOBILITY OF YOUR SHOULDER: Codman's Exercise or Pendulum Exercise  This exercise may be performed in a prone (face-down) lying position or standing while leaning on a chair with the opposite arm. Its purpose is to relax the muscles in your shoulder and slowly but surely increase the range of motion and to relieve pain.   Lie on your stomach close to the side edge of the bed. Let your weak arm hang over the edge of the bed. Relax your shoulder, arm and hand. Let your shoulder blade relax and drop down.   Slowly and gently swing your arm forward and back. Do not use your neck muscles; relax them. It might be easier to have someone else gently start swinging your arm.   As pain decreases, increase your  swing. To start, arm swing should begin at 15 degree angles. In time and as pain lessens, move to 30-45 degree angles. Start with swinging for about 15 seconds, and work towards swinging for 3 to 5 minutes.   This exercise may also be performed in a standing/bent over position.   Stand and hold onto a sturdy chair with your good arm. Bend forward at the waist and bend your knees slightly to help protect your back. Relax your weak arm, let it hang limp. Relax your shoulder blade and let it drop.   Keep your shoulder relaxed and use body motion to swing your arm in small circles.   Stand up tall and relax.   Repeat motion and change direction of circles.   Start with swinging for about 30 seconds, and work towards swinging for 3 to 5 minutes.  STRETCHING EXERCISES:  Lift your arm out in front of you with the elbow bent at 90 degrees. Using your other arm gently pull the elbow forward and across your body.   Bend one arm behind you with the palm facing outward. Using the other arm, hold a towel or rope and reach this arm up above your head, then bend it at the elbow to move your wrist to behind your neck. Grab the free end of the towel with the hand behind your back.  Gently pull the towel up with the hand behind your neck, gradually increasing the pull on the hand behind the small of your back. Then, gradually pull down with the hand behind the small of your back. This will pull the hand and arm behind your neck further. Both shoulders will have an increased range of motion with repetition of this exercise.  STRENGTHENING EXERCISES:  Standing with your arm at your side and straight out from your shoulder with the elbow bent at 90 degrees, hold onto a small weight and slowly raise your hand so it points straight up in the air. Repeat this five times to begin with, and gradually increase to ten times. Do this four times per day. As you grow stronger you can gradually increase the weight.   Repeat  the above exercise, only this time using an elastic band. Start with your hand up in the air and pull down until your hand is by your side. As you grow stronger, gradually increase the amount you pull by increasing the number or size of the elastic bands. Use the same amount of repetitions.   Standing with your hand at your side and holding onto a weight, gradually lift the hand in front of you until it is over your head. Do the same also with the hand remaining at your side and lift the hand away from your body until it is again over your head. Repeat this five times to begin with, and gradually increase to ten times. Do this four times per day. As you grow stronger you can gradually increase the weight.  Document Released: 10/06/2002 Document Revised: 12/27/2010 Document Reviewed: 01/07/2005 [facility] Patient Information 2012 [place], [place].  Shoulder Exercises EXERCISES  RANGE OF MOTION (ROM) AND STRETCHING EXERCISES These exercises may help you when beginning to rehabilitate your injury. Your symptoms may resolve with or without further involvement from your physician, physical therapist or athletic trainer. While completing these exercises, remember:   Restoring tissue flexibility helps normal motion to return to the joints. This allows healthier, less painful movement and activity.   An effective stretch should be held for at least 30 seconds.   A stretch should never be painful. You should only feel a gentle lengthening or release in the stretched tissue.  ROM - Pendulum  Bend at the waist so that your right / left arm falls away from your body. Support yourself with your opposite hand on a solid surface, such as a table or a countertop.   Your right / left arm should be perpendicular to the ground. If it is not perpendicular, you need to lean over farther. Relax the muscles in your right / left arm and shoulder as much as possible.   Gently sway your hips and trunk so they move your  right / left arm without any use of your right / left shoulder muscles.   Progress your movements so that your right / left arm moves side to side, then forward and backward, and finally, both clockwise and counterclockwise.   Complete __________ repetitions in each direction. Many people use this exercise to relieve discomfort in their shoulder as well as to gain range of motion.  Repeat __________ times. Complete this exercise __________ times per day. STRETCH - Flexion, Standing  Stand with good posture. With an underhand grip on your right / left hand and an overhand grip on the opposite hand, grasp a broomstick or cane so that your hands are a little more than shoulder-width  apart.   Keeping your right / left elbow straight and shoulder muscles relaxed, push the stick with your opposite hand to raise your right / left arm in front of your body and then overhead. Raise your arm until you feel a stretch in your right / left shoulder, but before you have increased shoulder pain.   Try to avoid shrugging your right / left shoulder as your arm rises by keeping your shoulder blade tucked down and toward your mid-back spine. Hold __________ seconds.   Slowly return to the starting position.  Repeat __________ times. Complete this exercise __________ times per day. STRETCH - Internal Rotation  Place your right / left hand behind your back, palm-up.   Throw a towel or belt over your opposite shoulder. Grasp the towel/belt with your right / left hand.   While keeping an upright posture, gently pull up on the towel/belt until you feel a stretch in the front of your right / left shoulder.   Avoid shrugging your right / left shoulder as your arm rises by keeping your shoulder blade tucked down and toward your mid-back spine.   Hold __________. Release the stretch by lowering your opposite hand.  Repeat __________ times. Complete this exercise __________ times per day. STRETCH - External Rotation  and Abduction  Stagger your stance through a doorframe. It does not matter which foot is forward.   As instructed by your physician, physical therapist or athletic trainer, place your hands:   And forearms above your head and on the door frame.   And forearms at head-height and on the door frame.   At elbow-height and on the door frame.   Keeping your head and chest upright and your stomach muscles tight to prevent over-extending your low-back, slowly shift your weight onto your front foot until you feel a stretch across your chest and/or in the front of your shoulders.   Hold __________ seconds. Shift your weight to your back foot to release the stretch.  Repeat __________ times. Complete this stretch __________ times per day.  STRENGTHENING EXERCISES  These exercises may help you when beginning to rehabilitate your injury. They may resolve your symptoms with or without further involvement from your physician, physical therapist or athletic trainer. While completing these exercises, remember:   Muscles can gain both the endurance and the strength needed for everyday activities through controlled exercises.   Complete these exercises as instructed by your physician, physical therapist or athletic trainer. Progress the resistance and repetitions only as guided.   You may experience muscle soreness or fatigue, but the pain or discomfort you are trying to eliminate should never worsen during these exercises. If this pain does worsen, stop and make certain you are following the directions exactly. If the pain is still present after adjustments, discontinue the exercise until you can discuss the trouble with your clinician.   If advised by your physician, during your recovery, avoid activity or exercises which involve actions that place your right / left hand or elbow above your head or behind your back or head. These positions stress the tissues which are trying to heal.  STRENGTH - Scapular  Depression and Adduction  With good posture, sit on a firm chair. Supported your arms in front of you with pillows, arm rests or a table top. Have your elbows in line with the sides of your body.   Gently draw your shoulder blades down and toward your mid-back spine. Gradually increase the tension without tensing the  muscles along the top of your shoulders and the back of your neck.   Hold for __________ seconds. Slowly release the tension and relax your muscles completely before completing the next repetition.   After you have practiced this exercise, remove the arm support and complete it in standing as well as sitting.  Repeat __________ times. Complete this exercise __________ times per day.  STRENGTH - External Rotators  Secure a rubber exercise band/tubing to a fixed object so that it is at the same height as your right / left elbow when you are standing or sitting on a firm surface.   Stand or sit so that the secured exercise band/tubing is at your side that is not injured.   Bend your elbow 90 degrees. Place a folded towel or small pillow under your right / left arm so that your elbow is a few inches away from your side.   Keeping the tension on the exercise band/tubing, pull it away from your body, as if pivoting on your elbow. Be sure to keep your body steady so that the movement is only coming from your shoulder rotating.   Hold __________ seconds. Release the tension in a controlled manner as you return to the starting position.  Repeat __________ times. Complete this exercise __________ times per day.  STRENGTH - Supraspinatus  Stand or sit with good posture. Grasp a __________ weight or an exercise band/tubing so that your hand is "thumbs-up," like when you shake hands.   Slowly lift your right / left hand from your thigh into the air, traveling about 30 degrees from straight out at your side. Lift your hand to shoulder height or as far as you can without increasing any  shoulder pain. Initially, many people do not lift their hands above shoulder height.   Avoid shrugging your right / left shoulder as your arm rises by keeping your shoulder blade tucked down and toward your mid-back spine.   Hold for __________ seconds. Control the descent of your hand as you slowly return to your starting position.  Repeat __________ times. Complete this exercise __________ times per day.  STRENGTH - Shoulder Extensors  Secure a rubber exercise band/tubing so that it is at the height of your shoulders when you are either standing or sitting on a firm arm-less chair.   With a thumbs-up grip, grasp an end of the band/tubing in each hand. Straighten your elbows and lift your hands straight in front of you at shoulder height. Step back away from the secured end of band/tubing until it becomes tense.   Squeezing your shoulder blades together, pull your hands down to the sides of your thighs. Do not allow your hands to go behind you.   Hold for __________ seconds. Slowly ease the tension on the band/tubing as you reverse the directions and return to the starting position.  Repeat __________ times. Complete this exercise __________ times per day.  STRENGTH - Scapular Retractors  Secure a rubber exercise band/tubing so that it is at the height of your shoulders when you are either standing or sitting on a firm arm-less chair.   With a palm-down grip, grasp an end of the band/tubing in each hand. Straighten your elbows and lift your hands straight in front of you at shoulder height. Step back away from the secured end of band/tubing until it becomes tense.   Squeezing your shoulder blades together, draw your elbows back as you bend them. Keep your upper arm lifted away from your  body throughout the exercise.   Hold __________ seconds. Slowly ease the tension on the band/tubing as you reverse the directions and return to the starting position.  Repeat __________ times. Complete this  exercise __________ times per day. STRENGTH - Scapular Depressors  Find a sturdy chair without wheels, such as a from a dining room table.   Keeping your feet on the floor, lift your bottom from the seat and lock your elbows.   Keeping your elbows straight, allow gravity to pull your body weight down. Your shoulders will rise toward your ears.   Raise your body against gravity by drawing your shoulder blades down your back, shortening the distance between your shoulders and ears. Although your feet should always maintain contact with the floor, your feet should progressively support less body weight as you get stronger.   Hold __________ seconds. In a controlled and slow manner, lower your body weight to begin the next repetition.  Repeat __________ times. Complete this exercise __________ times per day.  Document Released: 11/21/2004 Document Revised: 12/27/2010 Document Reviewed: 04/21/2008 [facility] Patient Information 2012 [place], [place].

## 2011-08-13 NOTE — Progress Notes (Signed)
Subjective:    Patient ID: Donna Warren, female    DOB: 03/31/1950, 61 y.o.   MRN: [id number]  HPI  Patient comes in today for follow up of  multiple medical problems.   Taking the cymbalta at night and seems to be fine.   Poss slight gas and no se .   Except sexual se as with the other serotonin meds.  NO weight gain.   Unsure helping.  Still counseling .  Mostly find not sure if helping.   Seems to be  Functional and "ok"     Left arm jerked from dog walking   Night pain and right shoulder  achiness  ,   Deltoid area   And    Not right and hursts or sore when moving certain ways ( crss chest)   Injury 4-5 weeks ago.      40 pound dog. NO exercises.  Upper body.   No rx  Unsure what to take  No weakness no pop. No previous problem in that area.   Check area on left thigh somewhat pink ? If growing   Had dexa last week  On fosomax for about 6 years  ? If should continue . No se of med noted at this time doses weight bearing exercises  Review of Systems No cp sob exercise tolerance change bleeding  abd sx new rest as per hpi  Past history family history social history reviewed in the electronic medical record.   Outpatient Encounter Prescriptions as of 08/13/2011  Medication Sig Dispense Refill  . alendronate (FOSAMAX) 70 MG tablet Take 1 tablet (70 mg total) by mouth every 7 (seven) days. 1 tab once a week.  4 tablet  11  . ALPRAZolam (XANAX) 0.25 MG tablet Take 0.25 mg by mouth 3 (three) times daily as needed.       . Calcium Carb-Cholecalciferol (CALCIUM PLUS VITAMIN D3 PO) Take 2 tablets by mouth daily.        . DULoxetine (CYMBALTA) 60 MG capsule Take 1 capsule (60 mg total) by mouth daily.  30 capsule  2  . MULTIPLE VITAMIN PO Take by mouth.      . DULoxetine (CYMBALTA) 30 MG capsule Take 1 capsule (30 mg total) by mouth daily. Or alternating as directed  30 capsule  3  . DISCONTD: fish oil-omega-3 fatty acids 1000 MG capsule Take 2 g by mouth daily.             Objective:   Physical Exam BP 104/70  Pulse 90  Temp 97.8 F (36.6 C) (Oral)  Wt 126 lb (57.153 kg)  SpO2 99% WDWN in nad looks well  SKIN left thigh with 2 mm faint brown palpable fibromatous lesion no redness uniform color  Right shoulder  Full rom and strength  Area of pain anterior and posterior  Discomfort with cross chest motion . Oriented x 3. Normal cognition, attention, speech. Not anxious or depressed appearing   Good eye contact . dexa  Prelim review  Still in osteoporosis  range but no comparison data in EHR  In process for review    Assessment & Plan: Shoulder strain injury    Mood poss help   ph9 q given to track and fu  SE med sexual disc dose related can try alt lower dose to 30 mg or so  Skin  prob benign lesion can get derm to see if changing and going at next check.  Shoulder strain injury  Poss rc tendinitis  No fall   Good rom disc rehab   Exercises  Expectant management. And fu if  persistent or progressive  Osteoporosis :   dexa in process   On fos for about 6 years   No active fracturing but did have a od fem fracture with a running fall in 2002 pre  Meds.  ROV 4-6 months  or when due for wellness in Feb 2014  Total visit > 50% spent counseling and coordinating care

## 2011-08-14 DIAGNOSIS — L989 Disorder of the skin and subcutaneous tissue, unspecified: Secondary | ICD-10-CM | POA: Insufficient documentation

## 2011-08-20 ENCOUNTER — Telehealth: Payer: Self-pay | Admitting: Internal Medicine

## 2011-08-20 NOTE — Telephone Encounter (Signed)
Caller: Donna Warren; PCP: Madelin Headings.; CB#: [phone].Call regarding Medication Issue; Caller reports she was seen in the office appx 6 weeks ago and MD started her on Cymbalta 60mg . Seen last week for follow up and was given Rx for 30mg  tabs as well. She was out of town this weekend and left her pills (60mg ) where she was staying. She will be back there on Friday and needs 3 pills to get her through this week. Caller unsure if they will fill new Rx for 30mg  tablets or if she can get samples. RN suggested she check with Pharmacy re getting 2nd RX filled. She is agreeable, this RN will call her back at 9:30 to see if she was able to fill Rx. 09:31 RN callback to [facility] who reports Rx was filled. She will use 2 of 30 mg tabs until she can get her 60mg  tabs.

## 2011-09-17 ENCOUNTER — Telehealth: Payer: Self-pay | Admitting: Internal Medicine

## 2011-09-17 NOTE — Telephone Encounter (Signed)
Left message on voicemail for the pt to return my call. 

## 2011-09-17 NOTE — Telephone Encounter (Signed)
Caller: [name]; Patient Name: Donna Warren; PCP: Madelin Headings.; Best Callback Phone Number: [phone]; Reason for call: Intermittent dizziness "inside my head" with nausea. Gets hot and cold. Lays down and then feels better. Upon callback states that she feels completly fine at present. States that at 12:30pm today she got really dizzy, room was spinning, vomited X 2 and had one loose stool. "I couldn't even stand on my own."  Afebrile. Has happened a couple of times a day sometimes. Has an appointment for Friday, 09/20/11. Would like to be seen tomorrow. Is taking Cymbalta and was without medication from 09/07/11-09/17/11.    Last took 60MG  09/05/11 and 30MG  09/06/11 and 30MG  09/10/11. Onset dizziness 09/02/11 (when she started trying to decrease her dosage of Cympbalta from 60MG  to 30MG . Ringing and muffled sensation to right ear. Has been evaluated for this before. All emergent symptoms per Dizziness or Vertigo protocol ruled out. Home care advice given. Advised to be seen within 24 hours per guidelines. Appointment scheduled for 09/18/11 at 3:00PM with Dr. Fabian Sharp.

## 2011-09-17 NOTE — Telephone Encounter (Signed)
Was scheduled for Friday but called back and rescheduled for Thursday 09/18/11.

## 2011-09-18 ENCOUNTER — Ambulatory Visit (INDEPENDENT_AMBULATORY_CARE_PROVIDER_SITE_OTHER): Payer: BC Managed Care – PPO | Admitting: Internal Medicine

## 2011-09-18 ENCOUNTER — Encounter: Payer: Self-pay | Admitting: Internal Medicine

## 2011-09-18 VITALS — BP 102/66 | HR 82 | Temp 98.0°F | Wt 122.0 lb

## 2011-09-18 DIAGNOSIS — H9190 Unspecified hearing loss, unspecified ear: Secondary | ICD-10-CM

## 2011-09-18 DIAGNOSIS — R1110 Vomiting, unspecified: Secondary | ICD-10-CM | POA: Insufficient documentation

## 2011-09-18 DIAGNOSIS — H9319 Tinnitus, unspecified ear: Secondary | ICD-10-CM

## 2011-09-18 DIAGNOSIS — R42 Dizziness and giddiness: Secondary | ICD-10-CM

## 2011-09-18 DIAGNOSIS — H9311 Tinnitus, right ear: Secondary | ICD-10-CM | POA: Insufficient documentation

## 2011-09-18 MED ORDER — MECLIZINE HCL 25 MG PO TABS: 25.0000 mg | ORAL_TABLET | Freq: Three times a day (TID) | ORAL | Status: AC | PRN

## 2011-09-18 NOTE — Patient Instructions (Signed)
Your exam is good today.  Will help arrange another appointment with ear nose and throat Dr. Jenne Pane because you had your symptoms with your acute vertigo.  You can take as needed meclizine or Antivert for symptoms but we'll not sure the dizziness if it recurs. It is also okay to take an occasional Xanax for this.  We'll have ear nose and throat evaluation to make sure you don't have  Mnire's disease. Some of her symptoms could have been withdrawal from the Cymbalta however I would've expected to be continuous symptoms and not episodic. Although stopping the medication suddenly could have made her symptoms worse.

## 2011-09-18 NOTE — Progress Notes (Signed)
  Subjective:    Patient ID: Donna Warren, female    DOB: 02-Jun-1950, 61 y.o.   MRN: [id number]  HPI Patient comes in for an acute problem today. See: CAN message. Patient has been weaning Cymbalta that she's been on since at least June. She took 60 mg on August 15 and then 30 mg on the 16th went out of town for getting her medication. She did find it or he milligram to take on the 20th and otherwise has been off of the Cymbalta since that time. Over the last couple weeks she's experienced intermittent TZ in her head feeling spells sometimes related to a right ear symptom or roaring. She had a number of these spells and would have whole days in between where she felt fine. Yesterday while at a function she had acute onset of true vertigo with nausea when she began TE her sleep at a luncheon. She had to go lay down and had some vomiting. She was better when she laid still there was no associated visual changes numbness felt extremely bad unsure if she was having increased right ear symptoms at that time. She had no falling but felt like she was going to and had to hold on.  Then after an hour or so she suddenly got better and was fine. Today she has no symptoms although his not sure her right ear is back to baseline. She calls it a no waist that's coming back.  A couple years ago she had some in her ear or noises in her right ear was evaluated by Dr. Jenne Pane with an MRI eventually the symptoms went away and she hasn't had them for about a year.   Review of Systems No vision changes fever or severe headaches weakness falling  Numbness unusual bleeding. She did have some sweating with the above issues. Unsure she should travel with is unsure she should take medication.  Past history family history social history reviewed in the electronic medical record.     Objective:   Physical Exam BP 102/66  Pulse 82  Temp 98 F (36.7 C) (Oral)  Wt 122 lb (55.339 kg)  SpO2 97% Well-developed well-nourished in  no acute distress HEENT is atraumatic eyes PERRLA EOMs are full without pathologic nystagmus   OP tongue is midline  ears small amount of wax no abnormal TM changes. Face appears symmetrical Neck no bruits are heard. Chest clear to auscultation cardiac S1-S2 no gallops murmurs Abdomen soft without organomegaly NEURO: oriented x 3 CN 3-12 appear intact. Hearing not tested  No focal muscle weakness or atrophy. DTRs symmetrical. Gait WNL.  Grossly non focal. No tremor or abnormal movement. Romberg is negative good heel to toe walking     Assessment & Plan:   Acute vertigo episodes yesterday severe episode possibly associated with the right hearing or abnormal sounds. Not pulsatile or  vascular in nature.  Timing is consistent with Cymbalta withdrawal however the pattern and severity heart atypical being episodic and she is very well in between . Marland Kitchen She apparently has had a normal MRI in her past. Unsure if she could have Mnire's disease. In the short run into verge given for symptoms if needed and she could also try one of her Xanax however would have ENT to reevaluate and give another opinion.

## 2011-09-20 ENCOUNTER — Ambulatory Visit: Payer: BC Managed Care – PPO | Admitting: Internal Medicine

## 2011-10-14 ENCOUNTER — Encounter: Payer: Self-pay | Admitting: Internal Medicine

## 2011-10-14 ENCOUNTER — Ambulatory Visit (INDEPENDENT_AMBULATORY_CARE_PROVIDER_SITE_OTHER): Payer: BC Managed Care – PPO | Admitting: Internal Medicine

## 2011-10-14 VITALS — BP 120/76 | HR 100 | Temp 98.4°F | Wt 120.0 lb

## 2011-10-14 DIAGNOSIS — T887XXA Unspecified adverse effect of drug or medicament, initial encounter: Secondary | ICD-10-CM

## 2011-10-14 DIAGNOSIS — M810 Age-related osteoporosis without current pathological fracture: Secondary | ICD-10-CM

## 2011-10-14 DIAGNOSIS — H8109 Meniere's disease, unspecified ear: Secondary | ICD-10-CM

## 2011-10-14 LAB — CBC WITH DIFFERENTIAL/PLATELET
Eosinophils Absolute: 0 10*3/uL (ref 0.0–0.7)
MCHC: 32.9 g/dL (ref 30.0–36.0)
MCV: 96.4 fl (ref 78.0–100.0)
Monocytes Absolute: 0.3 10*3/uL (ref 0.1–1.0)
Neutrophils Relative %: 65.6 % (ref 43.0–77.0)
Platelets: 243 10*3/uL (ref 150.0–400.0)

## 2011-10-14 NOTE — Progress Notes (Signed)
Subjective:    Patient ID: Donna Warren, female    DOB: [birth year], 61 y.o.   MRN: [id number]  HPI Patient comes in today for SDA for problem evaluation. Since her last visit she has seen Dr. Jenne Pane ENT and diagnosed with Mnire's disease she's been placed on Maxide full dose 37.5/25 mg. She began to have increasing dizziness feeling weak. Dizzy off and on and episodic sick for hours .  Nauseous. She states that her vertigo spells are very unpredictable and difficult. Whole pill  maxide and couldn't  Get up stairs  And felt weak and now bp is better .  Worried about BP.  Tried 1/2 pill and off for 2 days  And 1/4 tab   Called ent office and stopped. Medication and offered an appointment this morning but she couldn't get there because of how she felt she comes in this afternoon to our office. She is trying a low-salt diet but is difficult at this time has questions about how much water she can take this weekend she was able to walk a 7 mile walk without difficulty but then at other times she feels badly.     Review of Systems Negative for chest pain shortness of breath new hearing problems or new pain. No bleeding  Past history family history social history reviewed in the electronic medical record. Outpatient Encounter Prescriptions as of 10/14/2011  Medication Sig Dispense Refill  . alendronate (FOSAMAX) 70 MG tablet Take 1 tablet (70 mg total) by mouth every 7 (seven) days. 1 tab once a week.  4 tablet  11  . ALPRAZolam (XANAX) 0.25 MG tablet Take 0.25 mg by mouth 3 (three) times daily as needed.       . Calcium Carb-Cholecalciferol (CALCIUM PLUS VITAMIN D3 PO) Take 2 tablets by mouth daily.        . MULTIPLE VITAMIN PO Take by mouth.      . triamterene-hydrochlorothiazide (MAXZIDE-25) 37.5-25 MG per tablet Take 1 tablet by mouth daily. Stopped taking temporarily            Objective:   Physical Exam  BP 120/76  Pulse 100  Temp 98.4 F (36.9 C) (Oral)  Wt 120 lb (54.432 kg)  SpO2  99% Well-developed well-nourished in no acute distress mildly anxious but appropriate cognitively intact. Reviewed her information. As well as her bottle.    Assessment & Plan:  menieres  disease confirmed diagnosis by Dr. Jenne Pane.  Se of med;  she got significantly weak and dizzy feeling when she started the whole pill of the Maxide. Her blood pressures low to begin with I suspect she got mildly hypotensive with some hydration issues. She's very worried about this and unsure what she should do.  She should get back with Dr. Jenne Pane schedule an appointment to discuss this but I would suggest she go low and slow work her way up many nuisance side effects become tolerated with time. She should continue to sodium restriction and we discussed getting a nutrition referral because is difficult to maintain a 1 g sodium diet. However she shouldn't restrict water fluids. She will contact us if she wants [place] to go ahead with a nutrition referral to any specific provider. Reviewed recommendations and up-to-date. He requests it is reasonable to repeat her BMP blood count and iron because of the way she is feeling. To be good baseline and she should restart the diuretic and follow up with Dr. Jenne Pane to consider other options treatment.    We  discussed her bone density scan although I cannot printed out today it still shows osteoporosis she's been on medication for about 7 years would not go past 10 years. Repeat her vitamin D level today was regular printed out for her she should be able to get this through her patient portal that was just begun.  Prolonged visit today counseling 30 minutes

## 2011-10-14 NOTE — Patient Instructions (Addendum)
Ok to try  1/2 dose diuretic even every other day and work up as tolerated. Sodium restriction.  May be help ful  dont have to restrict water.  Get back with Dr Jenne Pane about meds and side effects.  Will notify you  of labs when available. Will eventually get dxa report to you . As discussed    Meniere's Disease You have Meniere's disease. Meniere's disease is a term for the recurrent symptoms (problems) of vertigo (the room or you seem to spin around), tinnitus (ringing in the ears), and hearing loss. The cause is unknown. These episodes often come on suddenly and without warning. They are sometimes associated with nausea (feeling sick to your stomach) and vomiting. There is no cure. A number of strategies may help the symptoms. Surgical help is available if conservative measures fail. HOME CARE INSTRUCTIONS   If you smoke, STOP.   Restrict your salt intake.   Stop caffeine consumption (caffeinated coffee, sodas, and chocolate).   Do not drive during or near the time of attacks.   Over the counter Antivert (meclizine) at a 25 mg dosage 3 times per day may be helpful.  SEEK IMMEDIATE MEDICAL CARE IF:   Nausea and vomiting are continuous.   You have no relief from vertigo.  MAKE SURE YOU:   Understand these instructions.   Will watch your condition.   Will get help right away if you are not doing well or get worse.  Document Released: 01/05/2000 Document Revised: 12/27/2010 Document Reviewed: 01/07/2005 [facility] Patient Information 2012 [place], [place].

## 2011-10-15 LAB — BASIC METABOLIC PANEL
BUN: 21 mg/dL (ref 6–23)
CO2: 27 mEq/L (ref 19–32)
Chloride: 101 mEq/L (ref 96–112)
Creatinine, Ser: 0.9 mg/dL (ref 0.4–1.2)
Glucose, Bld: 92 mg/dL (ref 70–99)

## 2011-10-15 LAB — TSH: TSH: 0.97 u[IU]/mL (ref 0.35–5.50)

## 2011-10-15 LAB — IBC PANEL: Saturation Ratios: 29.2 % (ref 20.0–50.0)

## 2011-10-16 LAB — PTH, INTACT AND CALCIUM: PTH: 42.2 pg/mL (ref 14.0–72.0)

## 2011-10-27 ENCOUNTER — Encounter: Payer: Self-pay | Admitting: Internal Medicine

## 2011-11-19 ENCOUNTER — Other Ambulatory Visit: Payer: Self-pay | Admitting: Internal Medicine

## 2011-11-19 DIAGNOSIS — Z1231 Encounter for screening mammogram for malignant neoplasm of breast: Secondary | ICD-10-CM

## 2011-12-04 ENCOUNTER — Ambulatory Visit
Admission: RE | Admit: 2011-12-04 | Discharge: 2011-12-04 | Disposition: A | Discharge status: Home / Self Care | Payer: BC Managed Care – PPO | Source: Ambulatory Visit | Attending: Internal Medicine | Admitting: Internal Medicine

## 2011-12-04 DIAGNOSIS — Z1231 Encounter for screening mammogram for malignant neoplasm of breast: Secondary | ICD-10-CM

## 2012-02-06 ENCOUNTER — Other Ambulatory Visit: Payer: Self-pay | Admitting: Internal Medicine

## 2012-03-04 ENCOUNTER — Other Ambulatory Visit: Payer: Self-pay | Admitting: Internal Medicine

## 2012-04-29 ENCOUNTER — Other Ambulatory Visit: Payer: Self-pay | Admitting: Internal Medicine

## 2012-04-30 NOTE — Telephone Encounter (Signed)
This patient was last seen in September 2013.  Do you want to see her back in the office?  How many refills?

## 2012-05-01 NOTE — Telephone Encounter (Signed)
Ok to refill x 6 months   Can come in fall for next visit and labs

## 2012-05-12 ENCOUNTER — Other Ambulatory Visit: Payer: Self-pay | Admitting: Family Medicine

## 2012-10-23 ENCOUNTER — Other Ambulatory Visit: Payer: Self-pay | Admitting: Internal Medicine

## 2012-10-26 NOTE — Telephone Encounter (Signed)
Last seen 10/14/11.  Please advise.  Thanks!

## 2012-10-28 ENCOUNTER — Encounter: Payer: Self-pay | Admitting: Internal Medicine

## 2012-10-28 NOTE — Telephone Encounter (Signed)
Can refill x 1 months  Schedule either ROV  Yearly med evaluation  or cpx visit

## 2012-11-13 ENCOUNTER — Other Ambulatory Visit: Payer: Self-pay | Admitting: Family Medicine

## 2012-11-16 ENCOUNTER — Other Ambulatory Visit: Payer: Self-pay

## 2012-11-16 DIAGNOSIS — Z1231 Encounter for screening mammogram for malignant neoplasm of breast: Secondary | ICD-10-CM

## 2012-11-23 ENCOUNTER — Other Ambulatory Visit: Payer: Self-pay | Admitting: Family Medicine

## 2012-12-04 ENCOUNTER — Encounter: Payer: Self-pay | Admitting: Internal Medicine

## 2012-12-04 ENCOUNTER — Ambulatory Visit (INDEPENDENT_AMBULATORY_CARE_PROVIDER_SITE_OTHER): Payer: BC Managed Care – PPO | Admitting: Internal Medicine

## 2012-12-04 DIAGNOSIS — M79609 Pain in unspecified limb: Secondary | ICD-10-CM

## 2012-12-04 NOTE — Progress Notes (Signed)
Chief Complaint  Patient presents with  . Foot Pain    Pt has a "bump" on the bottom of her left foot.  Discovered this morning.  Painful to walk on.    HPI: Patient comes in today for acute visit. She is in her usual state of good health until this morning when she put her foot down to walk and had a tender nodule area on the left sole of her foot close to her heel but not on her heel. Denies any specific injury does regular walking but didn't do it in the last few days has never had this before doesn't remember a foreign body. No plantar fasciitis no new shoes.  She does have Mnire's disease and has done well on the chlorthalidone had dizziness with HCTZ. She sees Dr. Dorma Russell. Is on a very low salt diet avoiding processed foods and her attacks have resolved or are controlled. She has lost weight and feels good eating healthier usually exercises walking. Energy level is better. ROS: See pertinent positives and negatives per HPI. No unusual bruising or bleeding no trauma.  Past Medical History  Diagnosis Date  . Osteoporosis   . 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 Fibroids   . Fatty liver   . Gilbert's syndrome   . Femur fracture, right 2003  . Spontaneous abortion   . Osteoporosis   . Hx of adenomatous colonic polyps   . Other hemochromatosis     heterozygosity for h63d gene    Family History  Problem Relation Age of Onset  . Colon cancer Father   . Stroke      silent  . Depression Mother   . Anxiety disorder Mother   . Dementia Mother   . Other Mother     high calcium  . Other Brother     ER MD- fatty liver  . Other Brother     H63D hemochromatosis gene  . Other Daughter     H63d hemochromatosis   . Hearing loss Mother     death  . Other Father     tinnitus    History   Social History  . Marital Status: Married    Spouse Name: N/A    Number of Children: N/A  . Years of Education: N/A   Social History Main Topics  . Smoking  status: Never Smoker   . Smokeless tobacco: Never Used  . Alcohol Use: 0.6 oz/week    1 Glasses of wine per week  . Drug Use: No  . Sexual Activity: None   Other Topics Concern  . 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    Daily caffeine use   Sleep  Often disrupted.       Hof 1 pet dogs    Outpatient Encounter Prescriptions as of 12/04/2012  Medication Sig  . alendronate (FOSAMAX) 70 MG tablet TAKE ONE TABLET BY MOUTH EVERY WEEK  . Calcium Carb-Cholecalciferol (CALCIUM PLUS VITAMIN D3 PO) Take 2 tablets by mouth daily.    . chlorthalidone (HYGROTON) 25 MG tablet Take 25 mg by mouth daily.  . MULTIPLE VITAMIN PO Take by mouth.  . [DISCONTINUED] alendronate (FOSAMAX) 70 MG tablet TAKE ONE TABLET BY MOUTH EVERY WEEK  . [DISCONTINUED] ALPRAZolam (XANAX) 0.25 MG tablet Take 0.25 mg by mouth 3 (three) times  daily as needed.   . [DISCONTINUED] triamterene-hydrochlorothiazide (MAXZIDE-25) 37.5-25 MG per tablet Take 1 tablet by mouth daily.    EXAM:  BP 106/66  Pulse 94  Temp(Src) 97.8 F (36.6 C) (Oral)  Wt 117 lb (53.071 kg)  SpO2 98%  Body mass index is 20.07 kg/(m^2).  GENERAL: vitals reviewed and listed above, alert, oriented, appears well hydrated and in no acute distress MS: moves all extremities without noticeable focal  abnormality left foot normal except on the sole there is a slightly irregular nodule seemingly attached to the sole of the foot that looks purplish but is not a vein nonfluctuant firm minimally tender and not mobile. No foreign body felt rest of exam normal neurovascular appears normal PSYCH: pleasant and cooperative, no obvious depression or anxiety  ASSESSMENT AND PLAN:  Discussed the following assessment and plan:  Pain, foot, left - Plan: DG Foot Complete Left Uncertain cause possibly trim traumatized cyst  doesn't seem like a vein no alarm findings order for x-ray to rule out opaque foreign body but she can wait on this until next week avoid trauma to the weekend followup persistent or progressive Reviewed record she is due for wellness visit and hasn't had her chemistries checked in a year or even on the diuretic. Have her schedule for wellness visit in January with labs. -Patient advised to return or notify health care team  if symptoms worsen or persist or new concerns arise.  Patient Instructions  Can get x ray when convenient to r/o .  Foreign body.  When convenient.  Could  by a traumatized cyst .        Neta Mends. Panosh M.D.

## 2012-12-04 NOTE — Patient Instructions (Addendum)
Can get x ray when convenient to r/o .  Foreign body.  When convenient.  Could  by a traumatized cyst .  Wellness visit in January with laboratory studies we can add on ferritin and IBC panel in addition to CPX labs because of heterozygosity of hemachromatosis gene.

## 2012-12-07 ENCOUNTER — Ambulatory Visit (INDEPENDENT_AMBULATORY_CARE_PROVIDER_SITE_OTHER)
Admission: RE | Admit: 2012-12-07 | Discharge: 2012-12-07 | Disposition: A | Discharge status: Home / Self Care | Payer: BC Managed Care – PPO | Source: Ambulatory Visit | Attending: Internal Medicine | Admitting: Internal Medicine

## 2012-12-07 DIAGNOSIS — M79609 Pain in unspecified limb: Secondary | ICD-10-CM

## 2012-12-09 NOTE — Progress Notes (Signed)
Quick Note:  Tell patient that x ray shows no acute abnormality. Only some arthritis ______

## 2012-12-10 ENCOUNTER — Other Ambulatory Visit: Payer: Self-pay | Admitting: Family Medicine

## 2012-12-10 ENCOUNTER — Telehealth: Payer: Self-pay | Admitting: Family Medicine

## 2012-12-10 DIAGNOSIS — M25572 Pain in left ankle and joints of left foot: Secondary | ICD-10-CM

## 2012-12-10 NOTE — Telephone Encounter (Signed)
Order placed in the system. 

## 2012-12-10 NOTE — Telephone Encounter (Signed)
Please do referral to podiatry or sports medicine .

## 2012-12-10 NOTE — Telephone Encounter (Signed)
I spoke to the pt and gave the results of foot x-ray.  She continue to have pain in her foot.  Still has the raised area on the bottom of her foot.  She has tried heat and cold on her foot.  The patient would like to know what she should do next.  Asked about a referral to podiatry.  Please advise.  Thanks!

## 2012-12-29 ENCOUNTER — Ambulatory Visit
Admission: RE | Admit: 2012-12-29 | Discharge: 2012-12-29 | Disposition: A | Discharge status: Home / Self Care | Payer: BC Managed Care – PPO | Source: Ambulatory Visit

## 2012-12-29 DIAGNOSIS — Z1231 Encounter for screening mammogram for malignant neoplasm of breast: Secondary | ICD-10-CM

## 2013-01-25 ENCOUNTER — Ambulatory Visit (INDEPENDENT_AMBULATORY_CARE_PROVIDER_SITE_OTHER): Payer: BC Managed Care – PPO | Admitting: Podiatry

## 2013-01-25 ENCOUNTER — Ambulatory Visit: Payer: Self-pay | Admitting: Podiatry

## 2013-01-25 ENCOUNTER — Encounter: Payer: Self-pay | Admitting: Podiatry

## 2013-01-25 VITALS — BP 103/59 | HR 64 | Resp 16 | Ht 64.0 in | Wt 117.0 lb

## 2013-01-25 DIAGNOSIS — M722 Plantar fascial fibromatosis: Secondary | ICD-10-CM

## 2013-01-25 DIAGNOSIS — D492 Neoplasm of unspecified behavior of bone, soft tissue, and skin: Secondary | ICD-10-CM

## 2013-01-25 MED ORDER — TRIAMCINOLONE ACETONIDE 10 MG/ML IJ SUSP: 10.0000 mg | Freq: Once | INTRAMUSCULAR | Status: DC

## 2013-01-25 NOTE — Progress Notes (Signed)
Subjective:     Patient ID: Donna Warren, female   DOB: March 31, 1950, 63 y.o.   MRN: [id number]  Foot Pain   patient presents with a small nodule plantar aspect of the left arch of approximate 7 weeks duration. States it is mildly tender and she also noted one in the arch itself that she is not sure whether or not it may or may not have been there   Review of Systems  All other systems reviewed and are negative.       Objective:   Physical Exam  Nursing note and vitals reviewed. Constitutional: She is oriented to person, place, and time.  Cardiovascular: Intact distal pulses.   Musculoskeletal: Normal range of motion.  Neurological: She is oriented to person, place, and time.  Skin: Skin is warm.   neurovascular status intact with muscle strength adequate and no equinus condition noted of both feet. I noted plantar aspect left there is a small nodule within the proximal arch measuring approximately 3 x 3 mm and more distal I noted a small nodule that appears fibrous within the fascia itself measuring 5 x 5 mm. 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    Plan:     H&P reviewed with patient and condition discussed. I offered an MRI but she wants to hold off and see if it will get smaller over the next one month. I did a careful injection not directly into the nodule but plantar to it to see if I could shrink the proximal nodule and instructed on heat therapy and that if it does not shrink if it were to grow or become painful we will get an MRI of this to rule out any types of issues patient to call us if there's any issues in the next 4 weeks

## 2013-01-25 NOTE — Progress Notes (Signed)
   Subjective:    Patient ID: Donna Warren, female    DOB: 12-29-1950, 63 y.o.   MRN: [id number]  HPI Comments: "I have this little knot on the bottom of my foot"  Pt states that she noticed for the past 6 weeks of a soft tissue knot in the arch left that aches some when walking. Dr Regis Bill eval and xrayed for foreign body, which was negative. Pt uses padding around the area to offload.     Review of Systems  All other systems reviewed and are negative.       Objective:   Physical Exam        Assessment & Plan:

## 2013-02-16 ENCOUNTER — Other Ambulatory Visit (INDEPENDENT_AMBULATORY_CARE_PROVIDER_SITE_OTHER): Payer: BC Managed Care – PPO

## 2013-02-16 DIAGNOSIS — Z0000 Encounter for general adult medical examination without abnormal findings: Secondary | ICD-10-CM

## 2013-02-16 LAB — HEPATIC FUNCTION PANEL
ALK PHOS: 172 U/L — AB (ref 39–117)
ALT: 115 U/L — ABNORMAL HIGH (ref 0–35)
AST: 48 U/L — ABNORMAL HIGH (ref 0–37)
Albumin: 4.3 g/dL (ref 3.5–5.2)
BILIRUBIN DIRECT: 0.1 mg/dL (ref 0.0–0.3)
Total Bilirubin: 1.5 mg/dL — ABNORMAL HIGH (ref 0.3–1.2)
Total Protein: 7.4 g/dL (ref 6.0–8.3)

## 2013-02-16 LAB — CBC WITH DIFFERENTIAL/PLATELET
BASOS ABS: 0 10*3/uL (ref 0.0–0.1)
BASOS PCT: 0.6 % (ref 0.0–3.0)
EOS ABS: 0.2 10*3/uL (ref 0.0–0.7)
Eosinophils Relative: 4.1 % (ref 0.0–5.0)
HCT: 39.7 % (ref 36.0–46.0)
Hemoglobin: 13.8 g/dL (ref 12.0–15.0)
LYMPHS PCT: 39.8 % (ref 12.0–46.0)
Lymphs Abs: 1.9 10*3/uL (ref 0.7–4.0)
MCHC: 34.8 g/dL (ref 30.0–36.0)
MCV: 93.4 fl (ref 78.0–100.0)
MONOS PCT: 5 % (ref 3.0–12.0)
Monocytes Absolute: 0.2 10*3/uL (ref 0.1–1.0)
NEUTROS PCT: 50.5 % (ref 43.0–77.0)
Neutro Abs: 2.4 10*3/uL (ref 1.4–7.7)
Platelets: 232 10*3/uL (ref 150.0–400.0)
RBC: 4.24 Mil/uL (ref 3.87–5.11)
RDW: 12.4 % (ref 11.5–14.6)
WBC: 4.7 10*3/uL (ref 4.5–10.5)

## 2013-02-16 LAB — BASIC METABOLIC PANEL
BUN: 17 mg/dL (ref 6–23)
CALCIUM: 9.6 mg/dL (ref 8.4–10.5)
CO2: 33 meq/L — AB (ref 19–32)
CREATININE: 1 mg/dL (ref 0.4–1.2)
Chloride: 98 mEq/L (ref 96–112)
GFR: 58.99 mL/min — AB (ref >60.00)
Glucose, Bld: 85 mg/dL (ref 70–99)
Potassium: 3.7 mEq/L (ref 3.5–5.1)
Sodium: 138 mEq/L (ref 135–145)

## 2013-02-16 LAB — LDL CHOLESTEROL, DIRECT: LDL DIRECT: 129.1 mg/dL

## 2013-02-16 LAB — LIPID PANEL
CHOL/HDL RATIO: 3
Cholesterol: 237 mg/dL — ABNORMAL HIGH (ref 0–200)
HDL: 86.1 mg/dL (ref >39.00)
TRIGLYCERIDES: 56 mg/dL (ref 0.0–149.0)
VLDL: 11.2 mg/dL (ref 0.0–40.0)

## 2013-02-16 LAB — TSH: TSH: 1.3 u[IU]/mL (ref 0.35–5.50)

## 2013-02-23 ENCOUNTER — Other Ambulatory Visit: Payer: Self-pay | Admitting: Internal Medicine

## 2013-02-23 ENCOUNTER — Ambulatory Visit (INDEPENDENT_AMBULATORY_CARE_PROVIDER_SITE_OTHER): Payer: BC Managed Care – PPO | Admitting: Internal Medicine

## 2013-02-23 ENCOUNTER — Other Ambulatory Visit (HOSPITAL_COMMUNITY)
Admission: RE | Admit: 2013-02-23 | Discharge: 2013-02-23 | Disposition: A | Discharge status: Home / Self Care | Payer: BC Managed Care – PPO | Source: Ambulatory Visit | Attending: Internal Medicine | Admitting: Internal Medicine

## 2013-02-23 ENCOUNTER — Encounter: Payer: Self-pay | Admitting: Internal Medicine

## 2013-02-23 VITALS — BP 96/60 | HR 83 | Temp 97.9°F | Ht 64.0 in | Wt 119.0 lb

## 2013-02-23 DIAGNOSIS — Z01419 Encounter for gynecological examination (general) (routine) without abnormal findings: Secondary | ICD-10-CM | POA: Insufficient documentation

## 2013-02-23 DIAGNOSIS — Z0000 Encounter for general adult medical examination without abnormal findings: Secondary | ICD-10-CM

## 2013-02-23 DIAGNOSIS — Z9189 Other specified personal risk factors, not elsewhere classified: Secondary | ICD-10-CM

## 2013-02-23 DIAGNOSIS — Z91B Personal risk factor of exposure to diethylstilbestrol: Secondary | ICD-10-CM

## 2013-02-23 DIAGNOSIS — R945 Abnormal results of liver function studies: Secondary | ICD-10-CM

## 2013-02-23 DIAGNOSIS — M810 Age-related osteoporosis without current pathological fracture: Secondary | ICD-10-CM

## 2013-02-23 DIAGNOSIS — Z148 Genetic carrier of other disease: Secondary | ICD-10-CM

## 2013-02-23 DIAGNOSIS — R7989 Other specified abnormal findings of blood chemistry: Secondary | ICD-10-CM

## 2013-02-23 DIAGNOSIS — H8109 Meniere's disease, unspecified ear: Secondary | ICD-10-CM

## 2013-02-23 LAB — HEPATIC FUNCTION PANEL
ALBUMIN: 4.8 g/dL (ref 3.5–5.2)
ALT: 60 U/L — ABNORMAL HIGH (ref 0–35)
AST: 33 U/L (ref 0–37)
Alkaline Phosphatase: 124 U/L — ABNORMAL HIGH (ref 39–117)
Bilirubin, Direct: 0.1 mg/dL (ref 0.0–0.3)
Total Bilirubin: 1.9 mg/dL — ABNORMAL HIGH (ref 0.3–1.2)
Total Protein: 8.2 g/dL (ref 6.0–8.3)

## 2013-02-23 LAB — FERRITIN: FERRITIN: 87.6 ng/mL (ref 10.0–291.0)

## 2013-02-23 LAB — IBC PANEL
IRON: 116 ug/dL (ref 42–145)
SATURATION RATIOS: 26.6 % (ref 20.0–50.0)
TRANSFERRIN: 311.2 mg/dL (ref 212.0–360.0)

## 2013-02-23 LAB — GAMMA GT: GGT: 103 U/L — ABNORMAL HIGH (ref 7–51)

## 2013-02-23 NOTE — Assessment & Plan Note (Signed)
Pap today no abn

## 2013-02-23 NOTE — Patient Instructions (Addendum)
Consider shingles vaccine.  Check with insurance .  Will need to  Fu with abnormal liver tests.  And  Iron studies  And ;poss ultrasound. Or other imaging.  And perhaps Dr  Carlean Purl involved .  Will inform you of labs pap when back

## 2013-02-23 NOTE — Progress Notes (Signed)
Chief Complaint  Patient presents with  . Annual Exam    HPI: Patient comes in today for Preventive Health Care visit   Saw  Dr Paulla Dolly   Did derotate injection and follow  Heat.  No difference  No pain at this point.,   Mnire's is stable no episodes taking medication.  Generally well no new complaints  Did have an episode of sharp lower abdominal pain near her C-section scar that lasted a few minutes. She gets this about once a year with no associated symptoms.  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  No recent illness otherwise anti-inflammatories Tylenol Advil Aleve. No recent acute illnesses.  Health Maintenance  Topic Date Due  . Zostavax  11/24/2010  . Influenza Vaccine  08/21/2013  . Pap Smear  03/06/2014  . Mammogram  12/30/2014  . Tetanus/tdap  06/18/2017  . Colonoscopy  08/12/2017   Health Maintenance Review  ROS:  GEN/ HEENT: No fever, significant weight changes sweats headaches vision problems hearing changes, CV/ PULM; No chest pain shortness of breath cough, syncope,edema  change in exercise tolerance. GI /GU: No adominal pain, vomiting, change in bowel habits. No blood in the stool. No significant GU symptoms. SKIN/HEME: ,no acute skin rashes suspicious lesions or bleeding. No lymphadenopathy, nodules, masses.  NEURO/ PSYCH:  No neurologic signs such as weakness numbness. No depression anxiety. IMM/ Allergy: No unusual infections.  Allergy .   REST of 12 system review negative except as per HPI   Past Medical History  Diagnosis Date  . Osteoporosis   . 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 Fibroids   . Fatty liver   . Gilbert's syndrome   . Femur fracture, right 2003  . Spontaneous abortion   . Osteoporosis   . Hx of adenomatous colonic polyps   . Other hemochromatosis     heterozygosity for h63d gene    Family History  Problem Relation Age  of Onset  . Colon cancer Father   . Stroke      silent  . Depression Mother   . Anxiety disorder Mother   . Dementia Mother   . Other Mother     high calcium  . Other Brother     ER MD- fatty liver  . Other Brother     H63D hemochromatosis gene  . Other Daughter     H63d hemochromatosis   . Hearing loss Mother     death  . Other Father     tinnitus    History   Social History  . Marital Status: Married    Spouse Name: N/A    Number of Children: N/A  . Years of Education: N/A   Social History Main Topics  . Smoking status: Never Smoker   . Smokeless tobacco: Never Used  . Alcohol Use: 0.6 oz/week    1 Glasses of wine per week  . Drug Use: No  . Sexual Activity: None   Other Topics Concern  . None   Social History Narrative    separated location     2 daughters   Husband a [employer] [place] director  Working 55 - 75.    Daily caffeine use   Sleep  Often disrupted.    Alcohol about once a week.   Hof 1 pet dogs    Outpatient Encounter Prescriptions as of 02/23/2013  Medication Sig  . alendronate (FOSAMAX) 70 MG tablet TAKE ONE TABLET BY MOUTH EVERY WEEK  . Calcium Carb-Cholecalciferol (CALCIUM PLUS VITAMIN D3 PO) Take 2 tablets by mouth daily.    . chlorthalidone (HYGROTON) 25 MG tablet Take 25 mg by mouth daily.  . MULTIPLE VITAMIN PO Take by mouth.  . [DISCONTINUED] triamcinolone acetonide (KENALOG) 10 MG/ML injection 10 mg     EXAM:  BP 96/60  Pulse 83  Temp(Src) 97.9 F (36.6 C) (Oral)  Ht $R'5\' 4"'rh$  (1.626 m)  Wt 119 lb (53.978 kg)  BMI 20.42 kg/m2  SpO2 98%  Body mass index is 20.42 kg/(m^2).  Physical Exam: Vital signs reviewed DXI:PJAS is a well-developed well-nourished alert cooperative   female who appears her stated age in no acute distress.  HEENT: normocephalic atraumatic , Eyes: PERRL EOM's full, conjunctiva clear, Nares: paten,t no deformity discharge or tenderness., Ears: no deformity EAC's clear  TMs with normal landmarks. Mouth: clear OP, no lesions, edema.  Moist mucous membranes. Dentition in adequate repair. NECK: supple without masses, thyromegaly or bruits. CHEST/PULM:  Clear to auscultation and percussion breath sounds equal no wheeze , rales or rhonchi. No chest wall deformities or tenderness. Breast: normal by inspection . No dimpling, discharge, masses, tenderness or discharge . CV: PMI is nondisplaced, S1 S2 no gallops, murmurs, rubs. Peripheral pulses are full without delay.No JVD .  ABDOMEN: Bowel sounds normal nontender  No guard or rebound, no hepato splenomegal no CVA tenderness.  Extremtities:  No clubbing cyanosis or edema, no acute joint swelling or redness no focal atrophy NEURO:  Oriented x3, cranial nerves 3-12 appear to be intact, no obvious focal weakness,gait within normal limits no abnormal reflexes or asymmetrical SKIN: No acute rashes normal turgor, color, no bruising or petechiae. PSYCH: Oriented, good eye contact, no obvious depression anxiety, cognition and judgment appear normal. LN: no cervical axillary inguinal adenopathy Pelvic: NL ext GU, labia clear without lesions or rash . Vagina no lesions .Cervix: clear  UTERUS: Neg CMT Adnexa:  clear no masses obvious  . PAP done rectal no mass stool heme negative    Lab Results  Component Value Date   WBC 4.7 02/16/2013   HGB 13.8 02/16/2013   HCT 39.7 02/16/2013   PLT 232.0 02/16/2013   GLUCOSE 85 02/16/2013   CHOL 237* 02/16/2013   TRIG 56.0 02/16/2013   HDL 86.10 02/16/2013   LDLDIRECT 129.1 02/16/2013   ALT 60* 02/23/2013   AST 33 02/23/2013   NA 138 02/16/2013   K 3.7 02/16/2013   CL 98 02/16/2013   CREATININE 1.0 02/16/2013   BUN 17 02/16/2013   CO2 33* 02/16/2013   TSH 1.30 02/16/2013   INR .9 06/05/2007   HGBA1C 5.1 09/21/2008    ASSESSMENT AND PLAN:  Discussed the following assessment and plan:  Visit for preventive health examination - Plan: Hepatic function panel, Ferritin, IBC panel, Gamma GT, Vit D   25 hydroxy (rtn osteoporosis monitoring), Mitochondrial antibodies, ANA, Hepatitis C antibody, Hep A Antibody, IgM, Hep B Surface Antibody, Hep B Surface Antigen, PAP [[place]]  Meniere's disease - Plan: Hepatic function panel, Ferritin, IBC panel, Gamma GT, Vit D  25 hydroxy (rtn osteoporosis monitoring), Mitochondrial antibodies, ANA, Hepatitis C antibody, Hep A Antibody, IgM, Hep B Surface Antibody, Hep B Surface Antigen  OSTEOPOROSIS - o fos check vit d  - Plan: Hepatic function panel, Ferritin, IBC panel, Gamma GT, Vit D  25 hydroxy (rtn osteoporosis monitoring), Mitochondrial antibodies, ANA, Hepatitis C antibody,  Hep A Antibody, IgM, Hep B Surface Antibody, Hep B Surface Antigen  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 no bx - Plan: Hepatic function panel, Ferritin, IBC panel, Gamma GT, Vit D  25 hydroxy (rtn osteoporosis monitoring), Mitochondrial antibodies, ANA, Hepatitis C antibody, Hep A Antibody, IgM, Hep B Surface Antibody, Hep B Surface Antigen  Hemochromatosis carrier Expectant management continue healthy lifestyle check labs and plan followup. She's actually doing quite well. Patient Care Team: Burnis Medin, MD as PCP - General Gatha Mayer, MD (Gastroenterology) Melida Quitter, MD (Otolaryngology) Patient Instructions  Consider shingles vaccine.  Check with insurance .  Will need to  Fu with abnormal liver tests.  And  Iron studies  And ;poss ultrasound. Or other imaging.  And perhaps Dr  Carlean Purl involved .  Will inform you of labs pap when back     [place] K. Panosh M.D.   Pre visit review using our clinic review  tool, if applicable. No additional management support is needed unless otherwise documented below in the visit note.

## 2013-02-24 LAB — ANA: ANA: POSITIVE — AB

## 2013-02-24 LAB — MITOCHONDRIAL ANTIBODIES: Mitochondrial M2 Ab, IgG: 0.24 (ref <0.91)

## 2013-02-24 LAB — HEPATITIS A ANTIBODY, IGM: Hep A IgM: NONREACTIVE

## 2013-02-24 LAB — ANTI-NUCLEAR AB-TITER (ANA TITER): ANA Titer 1: 1:40 {titer} — ABNORMAL HIGH

## 2013-02-24 LAB — HEPATITIS C ANTIBODY: HCV Ab: NEGATIVE

## 2013-02-24 LAB — HEPATITIS B SURFACE ANTIBODY,QUALITATIVE: HEP B S AB: POSITIVE — AB

## 2013-02-24 LAB — VITAMIN D 25 HYDROXY (VIT D DEFICIENCY, FRACTURES): Vit D, 25-Hydroxy: 52 ng/mL (ref 30–89)

## 2013-02-24 LAB — HEPATITIS B SURFACE ANTIGEN: HEP B S AG: NEGATIVE

## 2013-02-25 ENCOUNTER — Telehealth: Payer: Self-pay | Admitting: Internal Medicine

## 2013-02-25 NOTE — Telephone Encounter (Signed)
Pt has some additional information she would like to forward to dr.panosh by email if possible, regarding her diagnosis. States if she can not forward the email to her today she will drop off a hard copy in the morning.

## 2013-02-26 NOTE — Telephone Encounter (Signed)
Pt dropped off the information at the front desk on 02/26/13.

## 2013-03-01 NOTE — Telephone Encounter (Signed)
Received this to review

## 2013-03-11 ENCOUNTER — Telehealth: Payer: Self-pay | Admitting: Internal Medicine

## 2013-03-11 ENCOUNTER — Other Ambulatory Visit: Payer: Self-pay | Admitting: Internal Medicine

## 2013-03-11 DIAGNOSIS — R7989 Other specified abnormal findings of blood chemistry: Secondary | ICD-10-CM

## 2013-03-11 DIAGNOSIS — Z148 Genetic carrier of other disease: Secondary | ICD-10-CM

## 2013-03-11 DIAGNOSIS — R945 Abnormal results of liver function studies: Secondary | ICD-10-CM

## 2013-03-11 NOTE — Telephone Encounter (Signed)
Pt returning your call. If you do not get her on work number, pls call pt's cell : [phone]

## 2013-03-11 NOTE — Telephone Encounter (Signed)
Patient notified of test results and she also spoke to [facility] by telephone.

## 2013-03-29 ENCOUNTER — Other Ambulatory Visit (INDEPENDENT_AMBULATORY_CARE_PROVIDER_SITE_OTHER): Payer: BC Managed Care – PPO

## 2013-03-29 DIAGNOSIS — Z148 Genetic carrier of other disease: Secondary | ICD-10-CM

## 2013-03-29 DIAGNOSIS — R7989 Other specified abnormal findings of blood chemistry: Secondary | ICD-10-CM

## 2013-03-29 DIAGNOSIS — R945 Abnormal results of liver function studies: Secondary | ICD-10-CM

## 2013-03-29 LAB — HEPATIC FUNCTION PANEL
ALT: 46 U/L — AB (ref 0–35)
AST: 37 U/L (ref 0–37)
Albumin: 4.3 g/dL (ref 3.5–5.2)
Alkaline Phosphatase: 58 U/L (ref 39–117)
BILIRUBIN TOTAL: 1.5 mg/dL — AB (ref 0.3–1.2)
Bilirubin, Direct: 0.2 mg/dL (ref 0.0–0.3)
Total Protein: 7.1 g/dL (ref 6.0–8.3)

## 2013-03-29 LAB — GAMMA GT: GGT: 63 U/L — AB (ref 7–51)

## 2013-03-31 LAB — CERULOPLASMIN: Ceruloplasmin: 25 mg/dL (ref 18–53)

## 2013-04-08 ENCOUNTER — Other Ambulatory Visit: Payer: Self-pay | Admitting: Family Medicine

## 2013-04-08 DIAGNOSIS — R7989 Other specified abnormal findings of blood chemistry: Secondary | ICD-10-CM

## 2013-05-03 ENCOUNTER — Telehealth: Payer: Self-pay | Admitting: Internal Medicine

## 2013-05-03 NOTE — Telephone Encounter (Signed)
Pt thought she had referral to dr Celesta Aver office. Can you put in referral and then pt can sch labs 2 wks prior? Pt needs referral in oreder to get in quickly

## 2013-05-05 NOTE — Telephone Encounter (Signed)
There are future orders already in the computer for this patient.  Why does she need a referral to Dr. Carlean Purl.  WP wrote in her note that it is a possibility to get GI involved.

## 2013-05-05 NOTE — Telephone Encounter (Signed)
lmom for pt to cb

## 2013-05-18 ENCOUNTER — Other Ambulatory Visit (INDEPENDENT_AMBULATORY_CARE_PROVIDER_SITE_OTHER): Payer: BC Managed Care – PPO

## 2013-05-18 ENCOUNTER — Telehealth: Payer: Self-pay | Admitting: Internal Medicine

## 2013-05-18 ENCOUNTER — Other Ambulatory Visit: Payer: Self-pay | Admitting: Family Medicine

## 2013-05-18 DIAGNOSIS — R945 Abnormal results of liver function studies: Secondary | ICD-10-CM

## 2013-05-18 DIAGNOSIS — R7989 Other specified abnormal findings of blood chemistry: Secondary | ICD-10-CM

## 2013-05-18 LAB — HEPATIC FUNCTION PANEL
ALK PHOS: 90 U/L (ref 39–117)
ALT: 117 U/L — ABNORMAL HIGH (ref 0–35)
AST: 56 U/L — ABNORMAL HIGH (ref 0–37)
Albumin: 4.6 g/dL (ref 3.5–5.2)
BILIRUBIN DIRECT: 0.2 mg/dL (ref 0.0–0.3)
BILIRUBIN TOTAL: 1.8 mg/dL — AB (ref 0.3–1.2)
Total Protein: 7.5 g/dL (ref 6.0–8.3)

## 2013-05-18 LAB — GAMMA GT: GGT: 90 U/L — ABNORMAL HIGH (ref 7–51)

## 2013-05-18 NOTE — Telephone Encounter (Addendum)
Pt need to speak with Dr Regis Bill regarding her lab results , she came in today to have them done today If they are normal does she need to continue with the referral to [facility] which is not in the system yet . Pt wants to know what is the next step in care please advise

## 2013-05-19 ENCOUNTER — Telehealth: Payer: Self-pay | Admitting: Internal Medicine

## 2013-05-19 NOTE — Telephone Encounter (Signed)
appt set up with Janett Billow on Friday, ok'd with pt

## 2013-05-19 NOTE — Telephone Encounter (Signed)
Patient given results by telephone  Informed her the referral to GI has been placed.  Dr. Celesta Aver office has already tried to reach her.  Unfortunately, she missed their call but will call them back today.

## 2013-05-19 NOTE — Telephone Encounter (Signed)
They are abnormal again    please give her results  Since they are abnormal again  I would proceed follow through with Gi opinion.

## 2013-05-21 ENCOUNTER — Ambulatory Visit (INDEPENDENT_AMBULATORY_CARE_PROVIDER_SITE_OTHER): Payer: BC Managed Care – PPO | Admitting: Gastroenterology

## 2013-05-21 ENCOUNTER — Encounter: Payer: Self-pay | Admitting: Gastroenterology

## 2013-05-21 ENCOUNTER — Ambulatory Visit: Payer: BC Managed Care – PPO

## 2013-05-21 VITALS — BP 92/58 | HR 72 | Ht 64.0 in | Wt 120.2 lb

## 2013-05-21 DIAGNOSIS — R945 Abnormal results of liver function studies: Principal | ICD-10-CM

## 2013-05-21 DIAGNOSIS — R7989 Other specified abnormal findings of blood chemistry: Secondary | ICD-10-CM | POA: Insufficient documentation

## 2013-05-21 DIAGNOSIS — K760 Fatty (change of) liver, not elsewhere classified: Secondary | ICD-10-CM

## 2013-05-21 NOTE — Progress Notes (Signed)
05/21/2013 Donna Warren [id number] January 01, 1951   HISTORY OF PRESENT ILLNESS:  This is a pleasant 63 year old female who is known to Dr. Carlean Purl.  She has history of elevated LFT's and underwent extensive evaluation in 2005/2006 by Dr. Debara Pickett in [place] and more recently at [facility].  She was diagnosed with fatty liver and Gilbert's disease.  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  Her LFT's had been normal (except for a slight elevation in her total bili) until three months ago when they were noted to be elevated again.  On two subsequent checks they had decreased some, but most recently on 4/29 they were up somewhat again with an ALT 117, AST 56 and normal ALP.  Total bili is 1.8, which is likely reflective of her Gilbert's.  Albumin is normal.  Her PCP, Dr. Regis Bill, referred her back to our office for further evaluation, but she performed several other liver serologies recently as well.  Her iron studies are normal, viral hepatitis is negative, AMA is negative, and ceruloplasmin is negative.  The only difference when compared with previous evaluation is a now positive ANA with ratio of 1:40; it was negative in 2005/2006.  Her last ultrasound was in 2009 and showed fatty liver.  She had an MRI of the abdomen in 2009 as well, which revealed only a benign simple cyst in the liver.  She has never undergone liver biopsy.  She does not have any complaints.   ASMA and Immunoglobulins were negative in 2005/2006 during her evaluation in [place].   Past Medical History  Diagnosis Date  . Osteoporosis   . 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 Fibroids   . Fatty liver   . Gilbert's syndrome   . Femur fracture, right 2003  . Spontaneous abortion   . Osteoporosis   . Hx of adenomatous colonic polyps   . Other hemochromatosis     heterozygosity for h63d gene   Past Surgical History  Procedure Laterality Date  . Colonoscopy  220, 2004, 2007,  08/13/2010    2002: 8 mm polyp 2004: no polyps 2007: no polyps 2012: hemorrhoids  . Breast cyst excision Right   . Cesarean section  1982  . Spontaneously abortion on 3rd preg      triploid dna on path  . Hosp r/o mi cv right chest and jaw pain  2010  . Ovarian cyst removal      reports that she has never smoked. She has never used smokeless tobacco. She reports that she drinks about .6 - 1.2 ounces of alcohol per week. She reports that she does not use illicit drugs. family history includes Anxiety disorder in her mother; Colon cancer in her father; Dementia in her mother; Depression in her mother; Hearing loss in her mother; Liver disease in her brother; Other in her brother, daughter, father, and mother; Stroke in an other family member. Allergies  Allergen Reactions  . Penicillins       Outpatient Encounter Prescriptions as of 05/21/2013  Medication Sig  . alendronate (FOSAMAX) 70 MG tablet TAKE ONE TABLET BY MOUTH EVERY WEEK  . Calcium Carb-Cholecalciferol (CALCIUM PLUS VITAMIN D3 PO) Take 2 tablets by mouth daily.    . chlorthalidone (HYGROTON) 25 MG tablet Take 25 mg by mouth daily.  . [DISCONTINUED] MULTIPLE VITAMIN PO Take by mouth.     REVIEW OF SYSTEMS  : All other systems reviewed and negative except where noted in the History  of Present Illness.   PHYSICAL EXAM: BP 92/58  Pulse 72  Ht 5\' 4"  (1.626 m)  Wt 120 lb 3.2 oz (54.522 kg)  BMI 20.62 kg/m2 General: Well developed white female in no acute distress Head: Normocephalic and atraumatic Eyes:  Sclerae anicteric, conjunctiva pink. Ears: Normal auditory acuity Lungs: Clear throughout to auscultation Heart: Regular rate and rhythm Abdomen: Soft, non-distended.  Normal bowel sounds.  Non-tender. Musculoskeletal: Symmetrical with no gross deformities  Skin: No lesions on visible extremities Extremities: No edema  Neurological: Alert oriented x 4, grossly non-focal Psychological:  Alert and cooperative. Normal mood  and affect  ASSESSMENT AND PLAN: -Elevated LFT's previously thought to be secondary to fatty liver and Gilbert's disease after extensive evaluation by hepatology in [place] in 2005/2006 and more recently at [facility].  Now with increase in LFT's again with new positive ANA study, which may be non-specific.  I discussed with Dr. Carlean Purl and we will proceed as follows:  We will recheck ANA to see if the titer has increased at all since the last was checked in 02/2013.  We will also re-check ASMA and IgG (were both negative on evaluation in 2005/2006).  Will recheck ultrasound as well.  We discussed liver biopsy briefly, but Dr. Carlean Purl will contact her with the above results to discuss further plan and evaluation pending those results.

## 2013-05-21 NOTE — Patient Instructions (Signed)
Your physician has requested that you go to the basement for the following lab work before leaving today: AMA, ANA, IgG  You have been scheduled for an abdominal ultrasound at [facility] (1st floor of hospital) on 05/28/13 at 8:00 am. Please arrive 15 minutes prior to your appointment for registration. Make certain not to have anything to eat or drink 6 hours prior to your appointment. Should you need to reschedule your appointment, please contact radiology at [phone]. This test typically takes about 30 minutes to perform.

## 2013-05-21 NOTE — Progress Notes (Signed)
Agree as noted - have discussed this w/ Ms. Donna Warren.

## 2013-05-22 LAB — IGG: IgG (Immunoglobin G), Serum: 1180 mg/dL (ref 690–1700)

## 2013-05-24 LAB — ANA: Anti Nuclear Antibody(ANA): NEGATIVE

## 2013-05-25 LAB — ANTI-SMOOTH MUSCLE ANTIBODY, IGG: Smooth Muscle Ab: 34 U — ABNORMAL HIGH (ref <20)

## 2013-05-25 LAB — MITOCHONDRIAL ANTIBODIES: Mitochondrial M2 Ab, IgG: 0.31 (ref <0.91)

## 2013-05-28 ENCOUNTER — Ambulatory Visit (HOSPITAL_COMMUNITY)
Admission: RE | Admit: 2013-05-28 | Discharge: 2013-05-28 | Disposition: A | Discharge status: Home / Self Care | Payer: BC Managed Care – PPO | Source: Ambulatory Visit | Attending: Gastroenterology | Admitting: Gastroenterology

## 2013-05-28 ENCOUNTER — Other Ambulatory Visit: Payer: Self-pay

## 2013-05-28 DIAGNOSIS — R7402 Elevation of levels of lactic acid dehydrogenase (LDH): Secondary | ICD-10-CM

## 2013-05-28 DIAGNOSIS — N2889 Other specified disorders of kidney and ureter: Secondary | ICD-10-CM | POA: Insufficient documentation

## 2013-05-28 DIAGNOSIS — R945 Abnormal results of liver function studies: Secondary | ICD-10-CM

## 2013-05-28 DIAGNOSIS — R7989 Other specified abnormal findings of blood chemistry: Secondary | ICD-10-CM

## 2013-05-28 DIAGNOSIS — R740 Nonspecific elevation of levels of transaminase and lactic acid dehydrogenase [LDH]: Principal | ICD-10-CM

## 2013-05-28 DIAGNOSIS — R7401 Elevation of levels of liver transaminase levels: Secondary | ICD-10-CM

## 2013-05-28 NOTE — Progress Notes (Signed)
Quick Note:  I called results of this and her serologies ANA now neg but anti-smooth mm Ab +? autoimmune hepatitis  i have explained and recommended a liver biopsy and she agrees   Told her we would call her next week with info/appt, etc  She will need REV me about 1 week after the bx ______

## 2013-06-18 ENCOUNTER — Other Ambulatory Visit: Payer: Self-pay | Admitting: Radiology

## 2013-06-18 ENCOUNTER — Encounter (HOSPITAL_COMMUNITY): Payer: Self-pay | Admitting: Pharmacy Technician

## 2013-06-21 ENCOUNTER — Other Ambulatory Visit: Payer: Self-pay | Admitting: Radiology

## 2013-06-22 ENCOUNTER — Other Ambulatory Visit: Payer: Self-pay | Admitting: Radiology

## 2013-06-22 ENCOUNTER — Ambulatory Visit (HOSPITAL_COMMUNITY)
Admission: RE | Admit: 2013-06-22 | Discharge: 2013-06-22 | Disposition: A | Discharge status: Home / Self Care | Payer: BC Managed Care – PPO | Source: Ambulatory Visit | Attending: Internal Medicine | Admitting: Internal Medicine

## 2013-06-22 DIAGNOSIS — R740 Nonspecific elevation of levels of transaminase and lactic acid dehydrogenase [LDH]: Principal | ICD-10-CM

## 2013-06-22 DIAGNOSIS — R7402 Elevation of levels of lactic acid dehydrogenase (LDH): Secondary | ICD-10-CM

## 2013-06-22 DIAGNOSIS — R7401 Elevation of levels of liver transaminase levels: Secondary | ICD-10-CM

## 2013-06-23 ENCOUNTER — Encounter (HOSPITAL_COMMUNITY): Payer: Self-pay

## 2013-06-23 ENCOUNTER — Ambulatory Visit (HOSPITAL_COMMUNITY)
Admission: RE | Admit: 2013-06-23 | Discharge: 2013-06-23 | Disposition: A | Discharge status: Home / Self Care | Payer: BC Managed Care – PPO | Source: Ambulatory Visit | Attending: Internal Medicine | Admitting: Internal Medicine

## 2013-06-23 VITALS — BP 89/55 | HR 59 | Temp 98.2°F | Resp 18 | Ht 64.0 in | Wt 119.0 lb

## 2013-06-23 DIAGNOSIS — R7402 Elevation of levels of lactic acid dehydrogenase (LDH): Secondary | ICD-10-CM

## 2013-06-23 DIAGNOSIS — Z860100 Personal history of colon polyps, unspecified: Secondary | ICD-10-CM | POA: Insufficient documentation

## 2013-06-23 DIAGNOSIS — R945 Abnormal results of liver function studies: Secondary | ICD-10-CM | POA: Insufficient documentation

## 2013-06-23 DIAGNOSIS — R740 Nonspecific elevation of levels of transaminase and lactic acid dehydrogenase [LDH]: Secondary | ICD-10-CM

## 2013-06-23 DIAGNOSIS — Z79899 Other long term (current) drug therapy: Secondary | ICD-10-CM | POA: Insufficient documentation

## 2013-06-23 DIAGNOSIS — R7401 Elevation of levels of liver transaminase levels: Secondary | ICD-10-CM

## 2013-06-23 DIAGNOSIS — M810 Age-related osteoporosis without current pathological fracture: Secondary | ICD-10-CM | POA: Insufficient documentation

## 2013-06-23 DIAGNOSIS — K7689 Other specified diseases of liver: Secondary | ICD-10-CM | POA: Insufficient documentation

## 2013-06-23 HISTORY — DX: Meniere's disease, unspecified ear: H81.09

## 2013-06-23 LAB — APTT: aPTT: 32 seconds (ref 24–37)

## 2013-06-23 LAB — CBC
HCT: 36.5 % (ref 36.0–46.0)
HEMOGLOBIN: 13 g/dL (ref 12.0–15.0)
MCH: 32.1 pg (ref 26.0–34.0)
MCHC: 35.6 g/dL (ref 30.0–36.0)
MCV: 90.1 fL (ref 78.0–100.0)
Platelets: 170 10*3/uL (ref 150–400)
RBC: 4.05 MIL/uL (ref 3.87–5.11)
RDW: 11.8 % (ref 11.5–15.5)
WBC: 3.6 10*3/uL — AB (ref 4.0–10.5)

## 2013-06-23 LAB — PROTIME-INR
INR: 0.92 (ref 0.00–1.49)
Prothrombin Time: 12.2 seconds (ref 11.6–15.2)

## 2013-06-23 MED ORDER — OXYCODONE HCL 5 MG PO TABS
5.0000 mg | ORAL_TABLET | ORAL | Status: DC | PRN
  Filled 2013-06-23: qty 1

## 2013-06-23 MED ORDER — FENTANYL CITRATE 0.05 MG/ML IJ SOLN
INTRAMUSCULAR | Status: AC
  Filled 2013-06-23: qty 4

## 2013-06-23 MED ORDER — FENTANYL CITRATE 0.05 MG/ML IJ SOLN
INTRAMUSCULAR | Status: AC | PRN
  Administered 2013-06-23 (×2): 50 ug via INTRAVENOUS

## 2013-06-23 MED ORDER — MIDAZOLAM HCL 2 MG/2ML IJ SOLN
INTRAMUSCULAR | Status: AC
  Filled 2013-06-23: qty 4

## 2013-06-23 MED ORDER — MIDAZOLAM HCL 2 MG/2ML IJ SOLN
INTRAMUSCULAR | Status: AC | PRN
  Administered 2013-06-23: 2 mg via INTRAVENOUS

## 2013-06-23 MED ORDER — SODIUM CHLORIDE 0.9 % IV SOLN
INTRAVENOUS | Status: DC
  Administered 2013-06-23 (×2): via INTRAVENOUS

## 2013-06-23 NOTE — Sedation Documentation (Signed)
MD made aware of low BP. Pt. A+Ox3. Administering remaining NS in IV bag, = 500cc.

## 2013-06-23 NOTE — Discharge Instructions (Signed)
Liver Biopsy  A liver biopsy is done to confirm or prove a suspected problem. The liver is a large organ in the upper right hand of your abdomen. To do the test, the doctor puts a small needle into the right side of your abdomen. A tiny piece of liver tissue is taken and sent for testing. This should not be painful as the skin is injected with a local anesthetic that numbs the area.   HOW A BIOPSY IS PERFORMED  This is often performed as a same day surgery. This can be done in a hospital or clinic. Biopsies are often done under local anesthesia which makes the area of biopsy numb. Sometimes sedation is given to help patients relax.  If you are taking blood thinning medications or medications containing aspirin, this must be discussed with your caregiver before the test. This medication may need to be stopped for up to 7 days before the procedure, or the dose may need to be changed. You should review all of your other medications with your caregiver before the test.  You must remain in bed for 1 to 2 hours after the test. Having something to read may help pass the time.   LET YOUR CAREGIVERS KNOW ABOUT THE FOLLOWING:  · Allergies.  · Medications taken including herbs, eye drops, over -the- counter medications, and creams.  · Use of steroids (by mouth or creams).  · Previous problems with anesthetics or novocaine.  · Possibility of pregnancy, if this applies.  · History of blood clots (thrombophlebitis).  · History of bleeding or blood problems.  · Previous surgery.  · Other health problems.  BEFORE THE PROCEDURE  You should be present 60 minutes prior to your procedure or as directed. Check in at the admissions desk for filling out necessary forms if not pre-registered. There will be consent forms to sign prior to the procedure. There is a waiting area for your family while you are having your biopsy.  AFTER THE PROCEDURE  · After your biopsy, you will be taken to the recovery area where a nurse will watch and check  your progress.  · You may have to lie on your right side for 1 to 2 hours.  · Your blood pressure and pulse will be checked often.  · If you are having pain or feel sick, tell your nurse.  · After 1 to 2 hours, if you are going home, you may sit in a chair and get dressed. The nurse will let you know when you can get up.  · Once you are doing well, barring other problems, you will be allowed to go home. Once at home, putting an ice pack on your operative site may help with discomfort and keep swelling down.  · You may resume a normal diet and activities as directed.  · Change dressings as directed.  · Only take over-the-counter or prescription medicines for pain, discomfort, or fever as directed by your caregiver.  · Call for your results as instructed by your caregiver. Remember it is your job to be sure you get the results of your biopsy and any additional tests performed on the sample taken. Do not assume everything is fine if you do not hear from your caregiver.  HOME CARE INSTRUCTIONS   · You should rest for one to two days or as instructed.  · You will need to have a responsible adult take you home and stay with you overnight.  · Do not lift over   5 lbs. or play contact sports for two weeks.  · Do not drive for 24 hours.  · Do not take medication containing aspirin or drink alcohol for one week after this test.  SEEK MEDICAL CARE IF:   · There is increased bleeding (more than a small spot) from the biopsy site.  · You have redness, swelling, or increasing pain in the biopsy site.  · You develop swelling or pain in the abdomen.  · You have an unexplained oral temperature over 102° F (38.9° C).  · You notice a foul smell coming from the wound or dressing.  SEEK IMMEDIATE MEDICAL CARE IF:   · You develop a rash.  · You have difficulty breathing.  · You have allergic problems such as itching or swelling or shortness of breath.  Document Released: 03/30/2003 Document Revised: 04/01/2011 Document Reviewed:  08/18/2007  Exit[employer]® Patient Information ©2014 [employer].

## 2013-06-23 NOTE — Progress Notes (Signed)
BP still low, last reading 78 systolic.  Pt totally alert and oriented, denies any dizziness/lightheadedness, confusion, etc.  States "I feel normal".  All other vitals WNL.  Pt requested to alert staff if any other symptoms arise.  Pt verbalized understanding.  Garry Heater, RN 06/23/2013

## 2013-06-23 NOTE — H&P (Signed)
Chief Complaint: "I'm here for a liver biopsy" Referring Physician:Gessner HPI: Donna Warren is an 63 y.o. female with abnormal liver enzymes. She is referred for random liver core biopsy to assess for possible auto-immune hepatitis. PMHx, meds, prior imaging reviewed. Pt has been NPO this am and is otherwise feeling well.  Past Medical History:  Past Medical History  Diagnosis Date  . Osteoporosis   . 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 Fibroids   . Fatty liver   . Gilbert's syndrome   . Femur fracture, right 2003  . Spontaneous abortion   . Osteoporosis   . Hx of adenomatous colonic polyps   . Other hemochromatosis     heterozygosity for h63d gene  . Meniere disease     Right ear    Past Surgical History:  Past Surgical History  Procedure Laterality Date  . Colonoscopy  220, 2004, 2007, 08/13/2010    2002: 8 mm polyp 2004: no polyps 2007: no polyps 2012: hemorrhoids  . Breast cyst excision Right   . Cesarean section  1982  . Spontaneously abortion on 3rd preg      triploid dna on path  . Hosp r/o mi cv right chest and jaw pain  2010  . Ovarian cyst removal      Family History:  Family History  Problem Relation Age of Onset  . Colon cancer Father   . Stroke      silent  . Depression Mother   . Anxiety disorder Mother   . Dementia Mother   . Other Mother     high calcium  . Liver disease Brother     elevated lft. Unknown if he actually has liver disease  . Other Brother     H63D hemochromatosis gene  . Other Daughter     H63d hemochromatosis   . Hearing loss Mother     death  . Other Father     tinnitus    Social History:  reports that she has never smoked. She has never used smokeless tobacco. She reports that she drinks about .6 - 1.2 ounces of alcohol per week. She reports that she does not use illicit drugs.  Allergies:  Allergies  Allergen Reactions  . Penicillins     rash    Medications:   Medication List    ASK  your doctor about these medications       chlorthalidone 25 MG tablet  Commonly known as:  HYGROTON  Take 25 mg by mouth daily.     doxycycline 100 MG capsule  Commonly known as:  VIBRAMYCIN  Take 100 mg by mouth 2 (two) times daily. For 2 weeks starting 06/15/13        Please HPI for pertinent positives, otherwise complete 10 system ROS negative.  Physical Exam: Ht 5\' 4"  (1.626 m)  Wt 119 lb (53.978 kg)  BMI 20.42 kg/m2 Body mass index is 20.42 kg/(m^2).   General Appearance:  Alert, cooperative, no distress, appears stated age  Head:  Normocephalic, without obvious abnormality, atraumatic  ENT: Unremarkable  Neck: Supple, symmetrical, trachea midline  Lungs:   Clear to auscultation bilaterally, no w/r/r, respirations unlabored without use of accessory muscles.  Heart:  Regular rate and rhythm, S1, S2 normal, no murmur, rub or gallop.  Abdomen:   Soft, non-tender, non distended.  Neurologic: Normal affect, no gross deficits.   Results for orders placed during the hospital encounter of 06/23/13 (from the past 48 hour(s))  APTT  Status: None   Collection Time    06/23/13  8:10 AM      Result Value Ref Range   aPTT 32  24 - 37 seconds  CBC     Status: Abnormal   Collection Time    06/23/13  8:10 AM      Result Value Ref Range   WBC 3.6 (*) 4.0 - 10.5 K/uL   RBC 4.05  3.87 - 5.11 MIL/uL   Hemoglobin 13.0  12.0 - 15.0 g/dL   HCT 36.5  36.0 - 46.0 %   MCV 90.1  78.0 - 100.0 fL   MCH 32.1  26.0 - 34.0 pg   MCHC 35.6  30.0 - 36.0 g/dL   RDW 11.8  11.5 - 15.5 %   Platelets 170  150 - 400 K/uL  PROTIME-INR     Status: None   Collection Time    06/23/13  8:10 AM      Result Value Ref Range   Prothrombin Time 12.2  11.6 - 15.2 seconds   INR 0.92  0.00 - 1.49   No results found.  Assessment/Plan Abnormal liver enzymes For US guided liver core biopsy Discussed procedure, risks, complications, use of sedation Labs reviewed, ok Consent signed in chart  Ascencion Dike PA-C 06/23/2013, 9:02 AM

## 2013-06-23 NOTE — Procedures (Signed)
US guided liver biopsy.  2 cores.  No immediate complication.

## 2013-06-25 ENCOUNTER — Other Ambulatory Visit: Payer: Self-pay

## 2013-06-25 DIAGNOSIS — R945 Abnormal results of liver function studies: Principal | ICD-10-CM

## 2013-06-25 DIAGNOSIS — R7989 Other specified abnormal findings of blood chemistry: Secondary | ICD-10-CM

## 2013-06-25 NOTE — Progress Notes (Signed)
Quick Note:  Liver bx NORMAL Ask her to do IgA and TTG Ab and also Total CK level - look for celiac and release of trasaminases from muscle ______

## 2013-07-02 ENCOUNTER — Other Ambulatory Visit (INDEPENDENT_AMBULATORY_CARE_PROVIDER_SITE_OTHER): Payer: BC Managed Care – PPO

## 2013-07-02 DIAGNOSIS — R7989 Other specified abnormal findings of blood chemistry: Secondary | ICD-10-CM

## 2013-07-02 DIAGNOSIS — R945 Abnormal results of liver function studies: Principal | ICD-10-CM

## 2013-07-02 LAB — IGA: IGA: 142 mg/dL (ref 68–378)

## 2013-07-03 LAB — CK TOTAL AND CKMB (NOT AT ARMC): CK TOTAL: 75 U/L (ref 7–177)

## 2013-07-05 ENCOUNTER — Encounter: Payer: Self-pay | Admitting: Internal Medicine

## 2013-07-05 LAB — TISSUE TRANSGLUTAMINASE, IGA: TISSUE TRANSGLUTAMINASE AB, IGA: 5.2 U/mL (ref <20)

## 2013-07-06 ENCOUNTER — Other Ambulatory Visit: Payer: Self-pay

## 2013-07-06 DIAGNOSIS — R7989 Other specified abnormal findings of blood chemistry: Secondary | ICD-10-CM

## 2013-07-06 DIAGNOSIS — R945 Abnormal results of liver function studies: Principal | ICD-10-CM

## 2013-07-06 NOTE — Progress Notes (Signed)
Quick Note:  Celiac tests Ck ok Cause of abnl LFT's not clear (T bili increase is from Gilbert's) Will notify by My Chart and have her repeat LFt's - needs LFT order please - will tell her to come in tomorrow or later ______

## 2013-07-09 ENCOUNTER — Other Ambulatory Visit (INDEPENDENT_AMBULATORY_CARE_PROVIDER_SITE_OTHER): Payer: BC Managed Care – PPO

## 2013-07-09 DIAGNOSIS — R7989 Other specified abnormal findings of blood chemistry: Secondary | ICD-10-CM

## 2013-07-09 DIAGNOSIS — R945 Abnormal results of liver function studies: Principal | ICD-10-CM

## 2013-07-09 LAB — HEPATIC FUNCTION PANEL
ALBUMIN: 4.4 g/dL (ref 3.5–5.2)
ALT: 67 U/L — ABNORMAL HIGH (ref 0–35)
AST: 49 U/L — AB (ref 0–37)
Alkaline Phosphatase: 102 U/L (ref 39–117)
BILIRUBIN TOTAL: 1.8 mg/dL — AB (ref 0.2–1.2)
Bilirubin, Direct: 0.2 mg/dL (ref 0.0–0.3)
Total Protein: 7.1 g/dL (ref 6.0–8.3)

## 2013-07-13 NOTE — Progress Notes (Signed)
Quick Note:  Liver tests mildly abnormal Better than they were Cause remains somewhat mysterious outside of Gilbert's However, nothing bad identifies - some people tend to run higher AST and ALT than others  Recheck them in 3 months please If she has any ?'s I can call her at some point ______

## 2013-07-14 ENCOUNTER — Other Ambulatory Visit: Payer: Self-pay

## 2013-07-14 DIAGNOSIS — R7989 Other specified abnormal findings of blood chemistry: Secondary | ICD-10-CM

## 2013-07-14 DIAGNOSIS — R945 Abnormal results of liver function studies: Principal | ICD-10-CM

## 2013-11-05 ENCOUNTER — Other Ambulatory Visit (INDEPENDENT_AMBULATORY_CARE_PROVIDER_SITE_OTHER): Payer: 59

## 2013-11-05 DIAGNOSIS — R7989 Other specified abnormal findings of blood chemistry: Secondary | ICD-10-CM

## 2013-11-05 DIAGNOSIS — R945 Abnormal results of liver function studies: Principal | ICD-10-CM

## 2013-11-05 LAB — HEPATIC FUNCTION PANEL
ALK PHOS: 73 U/L (ref 39–117)
ALT: 58 U/L — ABNORMAL HIGH (ref 0–35)
AST: 40 U/L — ABNORMAL HIGH (ref 0–37)
Albumin: 3.5 g/dL (ref 3.5–5.2)
BILIRUBIN DIRECT: 0.2 mg/dL (ref 0.0–0.3)
BILIRUBIN TOTAL: 1.5 mg/dL — AB (ref 0.2–1.2)
Total Protein: 7.1 g/dL (ref 6.0–8.3)

## 2013-11-10 NOTE — Progress Notes (Signed)
Quick Note:  Call patient LFT's somewhat better W/u so far has not shown problems T bili up from Gilbert's Cannot explain abnormal transaminases - extensive eval no problems. Her "normal: may include higher transaminases As long as she feels ok nothing different Can do LFT's 2-4 x a year through PCP  Am ccing Dr. Regis Bill ______

## 2013-11-22 ENCOUNTER — Encounter (HOSPITAL_COMMUNITY): Payer: Self-pay

## 2014-02-14 ENCOUNTER — Other Ambulatory Visit: Payer: Self-pay

## 2014-02-14 DIAGNOSIS — Z1231 Encounter for screening mammogram for malignant neoplasm of breast: Secondary | ICD-10-CM

## 2014-02-18 ENCOUNTER — Ambulatory Visit
Admission: RE | Admit: 2014-02-18 | Discharge: 2014-02-18 | Disposition: A | Discharge status: Home / Self Care | Payer: 59 | Source: Ambulatory Visit

## 2014-02-18 DIAGNOSIS — Z1231 Encounter for screening mammogram for malignant neoplasm of breast: Secondary | ICD-10-CM

## 2014-07-08 ENCOUNTER — Telehealth: Payer: Self-pay | Admitting: Internal Medicine

## 2014-07-08 NOTE — Telephone Encounter (Signed)
Patient notified that Dr. Carlean Purl wanted her to have these monitored through her PCP according to lab results from 10/2013.  I did advise her that it looks like Dr. Regis Bill is ordering them and her office will be in contact in the next few days.  She will call back for any additional GI concerns

## 2014-07-08 NOTE — Telephone Encounter (Signed)
Please get pt scheduled for CPX or a follow up.  Let me know what she is scheduled for (this will determine the dx code).  I will then place lab orders.

## 2014-07-08 NOTE — Telephone Encounter (Signed)
Pt just want to have labs drawn and then follow up. Please put order in and I will call pt back schedule

## 2014-07-08 NOTE — Telephone Encounter (Signed)
Left message for patient to call back  

## 2014-07-08 NOTE — Telephone Encounter (Signed)
All the codes cpx elevated lfts  And elevaetd lipids  ok

## 2014-07-08 NOTE — Telephone Encounter (Signed)
Pt call to say that she would like to have labs done. She said she had a liver bx last year and she should be getting labs drawn. Can a lab order be put in.

## 2014-07-08 NOTE — Telephone Encounter (Signed)
Pt said Dr Regis Bill knows what lab she needs because of the bx

## 2014-07-08 NOTE — Telephone Encounter (Signed)
Can order lfts dx "abnormal lfts "but due for  Yearly wellness  We can do a cpx panel and then get her in for wellness or ROV with results .

## 2014-07-08 NOTE — Telephone Encounter (Signed)
Pt wishes to get lab work and then a follow up visit.  Celine Ahr use cpe code.  What codes to use? Elevated lft?  Dyspipidemia?  Please advise.  Thanks!

## 2014-07-08 NOTE — Telephone Encounter (Signed)
What labs and what dx code(s)?

## 2014-07-11 ENCOUNTER — Other Ambulatory Visit: Payer: Self-pay | Admitting: Family Medicine

## 2014-07-11 DIAGNOSIS — R7989 Other specified abnormal findings of blood chemistry: Secondary | ICD-10-CM

## 2014-07-11 DIAGNOSIS — R945 Abnormal results of liver function studies: Secondary | ICD-10-CM

## 2014-07-11 DIAGNOSIS — E785 Hyperlipidemia, unspecified: Secondary | ICD-10-CM

## 2014-07-11 DIAGNOSIS — Z0000 Encounter for general adult medical examination without abnormal findings: Secondary | ICD-10-CM

## 2014-07-11 NOTE — Telephone Encounter (Signed)
Pt has been sch

## 2014-07-11 NOTE — Telephone Encounter (Signed)
I have placed the orders for lab work.  Please schedule for lab and follow up.

## 2014-07-12 ENCOUNTER — Other Ambulatory Visit (INDEPENDENT_AMBULATORY_CARE_PROVIDER_SITE_OTHER): Payer: 59

## 2014-07-12 DIAGNOSIS — R7989 Other specified abnormal findings of blood chemistry: Secondary | ICD-10-CM | POA: Diagnosis not present

## 2014-07-12 DIAGNOSIS — Z0000 Encounter for general adult medical examination without abnormal findings: Secondary | ICD-10-CM

## 2014-07-12 DIAGNOSIS — R945 Abnormal results of liver function studies: Secondary | ICD-10-CM

## 2014-07-12 DIAGNOSIS — E785 Hyperlipidemia, unspecified: Secondary | ICD-10-CM | POA: Diagnosis not present

## 2014-07-12 LAB — CBC WITH DIFFERENTIAL/PLATELET
BASOS ABS: 0 10*3/uL (ref 0.0–0.1)
Basophils Relative: 0.5 % (ref 0.0–3.0)
EOS PCT: 1.3 % (ref 0.0–5.0)
Eosinophils Absolute: 0.1 10*3/uL (ref 0.0–0.7)
HEMATOCRIT: 40.3 % (ref 36.0–46.0)
Hemoglobin: 13.8 g/dL (ref 12.0–15.0)
LYMPHS ABS: 1.7 10*3/uL (ref 0.7–4.0)
Lymphocytes Relative: 39.3 % (ref 12.0–46.0)
MCHC: 34.3 g/dL (ref 30.0–36.0)
MCV: 94.6 fl (ref 78.0–100.0)
MONO ABS: 0.3 10*3/uL (ref 0.1–1.0)
Monocytes Relative: 6.4 % (ref 3.0–12.0)
NEUTROS PCT: 52.5 % (ref 43.0–77.0)
Neutro Abs: 2.3 10*3/uL (ref 1.4–7.7)
Platelets: 225 10*3/uL (ref 150.0–400.0)
RBC: 4.26 Mil/uL (ref 3.87–5.11)
RDW: 12.6 % (ref 11.5–15.5)
WBC: 4.4 10*3/uL (ref 4.0–10.5)

## 2014-07-12 LAB — HEPATIC FUNCTION PANEL
ALT: 43 U/L — ABNORMAL HIGH (ref 0–35)
AST: 29 U/L (ref 0–37)
Albumin: 4.5 g/dL (ref 3.5–5.2)
Alkaline Phosphatase: 74 U/L (ref 39–117)
BILIRUBIN TOTAL: 1.6 mg/dL — AB (ref 0.2–1.2)
Bilirubin, Direct: 0.3 mg/dL (ref 0.0–0.3)
Total Protein: 7.7 g/dL (ref 6.0–8.3)

## 2014-07-12 LAB — LIPID PANEL
CHOLESTEROL: 253 mg/dL — AB (ref 0–200)
HDL: 76.5 mg/dL (ref >39.00)
LDL CALC: 161 mg/dL — AB (ref 0–99)
NonHDL: 176.5
TRIGLYCERIDES: 76 mg/dL (ref 0.0–149.0)
Total CHOL/HDL Ratio: 3
VLDL: 15.2 mg/dL (ref 0.0–40.0)

## 2014-07-12 LAB — TSH: TSH: 1.48 u[IU]/mL (ref 0.35–4.50)

## 2014-07-12 LAB — BASIC METABOLIC PANEL
BUN: 24 mg/dL — AB (ref 6–23)
CO2: 34 meq/L — AB (ref 19–32)
CREATININE: 0.98 mg/dL (ref 0.40–1.20)
Calcium: 10.1 mg/dL (ref 8.4–10.5)
Chloride: 96 mEq/L (ref 96–112)
GFR: 60.8 mL/min (ref >60.00)
Glucose, Bld: 95 mg/dL (ref 70–99)
Potassium: 4 mEq/L (ref 3.5–5.1)
Sodium: 136 mEq/L (ref 135–145)

## 2014-07-20 ENCOUNTER — Encounter: Payer: Self-pay | Admitting: Internal Medicine

## 2014-07-20 ENCOUNTER — Ambulatory Visit (INDEPENDENT_AMBULATORY_CARE_PROVIDER_SITE_OTHER): Payer: 59 | Admitting: Internal Medicine

## 2014-07-20 VITALS — BP 120/78 | Temp 97.5°F | Wt 121.9 lb

## 2014-07-20 DIAGNOSIS — R945 Abnormal results of liver function studies: Secondary | ICD-10-CM

## 2014-07-20 DIAGNOSIS — E785 Hyperlipidemia, unspecified: Secondary | ICD-10-CM

## 2014-07-20 DIAGNOSIS — M810 Age-related osteoporosis without current pathological fracture: Secondary | ICD-10-CM | POA: Diagnosis not present

## 2014-07-20 DIAGNOSIS — Z148 Genetic carrier of other disease: Secondary | ICD-10-CM

## 2014-07-20 DIAGNOSIS — R7989 Other specified abnormal findings of blood chemistry: Secondary | ICD-10-CM | POA: Diagnosis not present

## 2014-07-20 NOTE — Progress Notes (Signed)
Pre visit review using our clinic review tool, if applicable. No additional management support is needed unless otherwise documented below in the visit note.  Chief Complaint  Patient presents with  . Follow-up    HPI:  Donna Warren 64 y.o.  comes in for chronic disease/ medication management she has had abnormal liver function tests and alpha-fetoprotein with major workup including evaluation at [facility] and a liver biopsy with no diagnosis. Felt to just follow and not be significant. Dr. Carlean Purl advised to do liver tests 2-4 times a year to follow. She is doing well eating very heart healthy and exercising  Menieres she is taking chlorthalidone per Dr. Thornell Mule and sees him once a year. Eats very healthy to avoid episodes  Skin has a few bumps on the left side of her nose question should she see a dermatologist and has been most recent no scaling or bleeding  Bpone health question due for another bone density scan. She tries to exercise take calcium vitamin D. She has a remote history of Fosamax off medicine a few years last bone density 2013 in the osteopenic range. ROS: See pertinent positives and negatives per HPI. No current chest pain shortness of breath syncope.  Past Medical History  Diagnosis Date  . Osteoporosis   . 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 Fibroids   . Fatty liver   . Gilbert's syndrome   . Femur fracture, right 2003  . Spontaneous abortion   . Osteoporosis   . Hx of adenomatous colonic polyps   . Other hemochromatosis     heterozygosity for h63d gene  . Meniere disease     Right ear    Family History  Problem Relation Age of Onset  . Colon cancer Father   . Stroke      silent  . Depression Mother   . Anxiety disorder Mother   . Dementia Mother   . Other Mother     high calcium  . Liver disease Brother     elevated lft. Unknown if he actually has liver disease  . Other Brother     H63D hemochromatosis gene  . Other Daughter      H63d hemochromatosis   . Hearing loss Mother     death  . Other Father     tinnitus    History   Social History  . Marital Status: Married    Spouse Name: N/A  . Number of Children: N/A  . Years of Education: N/A   Social History Main Topics  . Smoking status: Never Smoker   . Smokeless tobacco: Never Used  . Alcohol Use: 0.6 - 1.2 oz/week    1-2 Glasses of wine per week  . Drug Use: No  . Sexual Activity: Not on file   Other Topics Concern  . None   Social History Narrative    separated location     2 daughters   Husband a [employer] [place] director  Working 53 - 39.    Daily caffeine use   Sleep  Often disrupted.    Alcohol about once a week.   [place]of 1 pet dogs    Outpatient Prescriptions Prior to Visit  Medication Sig Dispense Refill  . chlorthalidone (HYGROTON) 25 MG tablet Take 25 mg by mouth daily.    Marland Kitchen doxycycline (VIBRAMYCIN) 100 MG capsule Take 100 mg by mouth 2 (two) times daily. For 2 weeks starting 06/15/13  No facility-administered medications prior to visit.     EXAM:  BP 120/78 mmHg  Temp(Src) 97.5 F (36.4 C) (Oral)  Wt 121 lb 14.4 oz (55.293 kg)  Body mass index is 20.91 kg/(m^2).  GENERAL: vitals reviewed and listed above, alert, oriented, appears well hydrated and in no acute distress HEENT: atraumatic, conjunctiva  clear, no obvious abnormalities on inspection of external nose and ears OP : no lesion edema or exudate  NECK: no obvious masses on inspection palpation  LUNGS: clear to auscultation bilaterally, no wheezes, rales or rhonchi, good air movement CV: HRRR, no clubbing cyanosis or  peripheral edema nl cap refill  Abdomen:  Sof,t normal bowel sounds without hepatosplenomegaly, no guarding rebound or masses no CVA tenderness Skin few bumps left nasal bridge area  One flesh colored on nose 2-3 mm  MS: moves all extremities without noticeable focal  abnormality PSYCH: pleasant and  cooperative, no obvious depression or anxiety Lab Results  Component Value Date   WBC 4.4 07/12/2014   HGB 13.8 07/12/2014   HCT 40.3 07/12/2014   PLT 225.0 07/12/2014   GLUCOSE 95 07/12/2014   CHOL 253* 07/12/2014   TRIG 76.0 07/12/2014   HDL 76.50 07/12/2014   LDLDIRECT 129.1 02/16/2013   LDLCALC 161* 07/12/2014   ALT 43* 07/12/2014   AST 29 07/12/2014   NA 136 07/12/2014   K 4.0 07/12/2014   CL 96 07/12/2014   CREATININE 0.98 07/12/2014   BUN 24* 07/12/2014   CO2 34* 07/12/2014   TSH 1.48 07/12/2014   INR 0.92 06/23/2013   HGBA1C 5.1 09/21/2008   Health Maintenance Due  Topic Date Due  . ZOSTAVAX  11/24/2010   Lab reviewed with patient today ASSESSMENT AND PLAN:  Discussed the following assessment and plan:  Abnormal LFTs - unknown cause fam hx same neg liver bx monitoring lfts improved recheck in about 4-6 months. With ferritin - Plan: Hepatic function panel, Ferritin  Hemochromatosis carrier - check ferritin next check  - Plan: Hepatic function panel, Ferritin  Osteoporosis - hx of fosomax use ? 10 years repeat dexa last 2013 vit d 2015 50 range  - Plan: DG Bone Density  Hyperlipidemia - favorable ratio less than 5% 10 year risk based on 2013 guidelines follow ls very healthy Recheck lsfts 4-5 months with ferritin  Disc zostavax Check into shingles vaccine ( Zostavax) reimbursement or cost to you  and can return at any time if call ahead for injection. Can get wellness cvisit next year as she is utd and reviewed parameters  already -Patient advised to return or notify health care team  if symptoms worsen ,persist or new concerns arise.  Patient Instructions  Ok to get updated dexa scan .   Recheck lfts  In 4 months with ferritin  No OV needed.  Then plan as needed.    Standley Brooking. Panosh M.D.

## 2014-07-20 NOTE — Patient Instructions (Signed)
Ok to get updated dexa scan .   Recheck lfts  In 4 months with ferritin  No OV needed.  Then plan as needed.

## 2014-07-29 ENCOUNTER — Ambulatory Visit (INDEPENDENT_AMBULATORY_CARE_PROVIDER_SITE_OTHER)
Admission: RE | Admit: 2014-07-29 | Discharge: 2014-07-29 | Disposition: A | Discharge status: Home / Self Care | Payer: 59 | Source: Ambulatory Visit | Attending: Internal Medicine | Admitting: Internal Medicine

## 2014-07-29 DIAGNOSIS — M810 Age-related osteoporosis without current pathological fracture: Secondary | ICD-10-CM

## 2014-08-12 ENCOUNTER — Ambulatory Visit (INDEPENDENT_AMBULATORY_CARE_PROVIDER_SITE_OTHER): Payer: 59 | Admitting: Internal Medicine

## 2014-08-12 ENCOUNTER — Encounter: Payer: Self-pay | Admitting: Internal Medicine

## 2014-08-12 VITALS — BP 100/62 | Temp 98.2°F | Wt 123.8 lb

## 2014-08-12 DIAGNOSIS — M810 Age-related osteoporosis without current pathological fracture: Secondary | ICD-10-CM

## 2014-08-12 DIAGNOSIS — M542 Cervicalgia: Secondary | ICD-10-CM

## 2014-08-12 DIAGNOSIS — N949 Unspecified condition associated with female genital organs and menstrual cycle: Secondary | ICD-10-CM | POA: Diagnosis not present

## 2014-08-12 NOTE — Patient Instructions (Signed)
Consider prolia for the osteoporosis  let us know what you decide.  Neck pain is probably postural and some arthritis ligature position neck hygiene if progressing let us know. Low-dose Tylenol safe minimize Advil Aleve products although they can be very helpful for the pain. Currently your kidney function is okay.  The bump in the vaginal area looks benign like a plugged gland but is progressing increasing in size redness difficulties we can get a gynecologist to see you.

## 2014-08-12 NOTE — Progress Notes (Signed)
Pre visit review using our clinic review tool, if applicable. No additional management support is needed unless otherwise documented below in the visit note.   Chief Complaint  Patient presents with  . Follow-up    dexa scan neck pain vaginal bump please check    HPI: Patient Donna Warren  comes in today for SDA for problem evaluation.  Wants to discuss bone density results was on Fosamax for long time but has been off for a long time. Asks about this finding of scoliosis and is a causing her problem.  Has problems with neck pain usually gets worse near the end of the day no weakness numbness neurologic findings thinks it could be related to sitting at the computer a lot.  Asks about her BUN kidney function slightly up last time.  Has a bump on the left perineum vaginal area that she might of had per month or 2 feels it more than sees it but just wanted to be checked to make sure it is not worrisome. Doesn't hurt no discharge no adenopathy.  ROS: See pertinent positives and negatives per HPI.no cp sob recenet fracture  Past Medical History  Diagnosis Date  . Osteoporosis   . 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 Fibroids   . Fatty liver   . Gilbert's syndrome   . Femur fracture, right 2003  . Spontaneous abortion   . Osteoporosis   . Hx of adenomatous colonic polyps   . Other hemochromatosis     heterozygosity for h63d gene  . Meniere disease     Right ear    Family History  Problem Relation Age of Onset  . Colon cancer Father   . Stroke      silent  . Depression Mother   . Anxiety disorder Mother   . Dementia Mother   . Other Mother     high calcium  . Liver disease Brother     elevated lft. Unknown if he actually has liver disease  . Other Brother     H63D hemochromatosis gene  . Other Daughter     H63d hemochromatosis   . Hearing loss Mother     death  . Other Father     tinnitus    History   Social History  . Marital Status:  Married    Spouse Name: N/A  . Number of Children: N/A  . Years of Education: N/A   Social History Main Topics  . Smoking status: Never Smoker   . Smokeless tobacco: Never Used  . Alcohol Use: 0.6 - 1.2 oz/week    1-2 Glasses of wine per week  . Drug Use: No  . Sexual Activity: Not on file   Other Topics Concern  . None   Social History Narrative    separated location     2 daughters   Husband a [employer] director  Working 53 - 59.    Daily caffeine use   Sleep  Often disrupted.    Alcohol about once a week.   Hof 1 pet dogs    Outpatient Prescriptions Prior to Visit  Medication Sig Dispense Refill  . chlorthalidone (HYGROTON) 25 MG tablet Take 25 mg by mouth daily.     No facility-administered medications prior to visit.     EXAM:  BP 100/62 mmHg  Temp(Src) 98.2 F (36.8 C) (Oral)  Wt 123 lb 12.8 oz (56.155 kg)  Body mass index is  21.24 kg/(m^2).  GENERAL: vitals reviewed and listed above, alert, oriented, appears well hydrated and in no acute distress NECK: no obvious masses on inspection palpation   Supple  MS: moves all extremities without noticeable focal  Abnormality Ext GU basically normal there is a 1-2 mm smooth round bump in the left para-clitoral area with a central pore. There is no pearliness scaliness or wart appearance. There are no ulcers there are no adenopathy no other lesions noted. PSYCH: pleasant and cooperative, no obvious depression or anxiety Lab Results  Component Value Date   WBC 4.4 07/12/2014   HGB 13.8 07/12/2014   HCT 40.3 07/12/2014   PLT 225.0 07/12/2014   GLUCOSE 95 07/12/2014   CHOL 253* 07/12/2014   TRIG 76.0 07/12/2014   HDL 76.50 07/12/2014   LDLDIRECT 129.1 02/16/2013   LDLCALC 161* 07/12/2014   ALT 43* 07/12/2014   AST 29 07/12/2014   NA 136 07/12/2014   K 4.0 07/12/2014   CL 96 07/12/2014   CREATININE 0.98 07/12/2014   BUN 24* 07/12/2014   CO2 34* 07/12/2014    TSH 1.48 07/12/2014   INR 0.92 06/23/2013   HGBA1C 5.1 09/21/2008    ASSESSMENT AND PLAN:  Discussed the following assessment and plan:  Osteoporosis - Discussed some options will think about it consider prolia or consult from endo pt will call about decision  Neck pain - Seems mechanical postural probably arthritic local measures exercise postural changes low-dose Tylenol caution with NSAIDs  Female genital lesion - Looks like a benign plugged skin related gland but if increases in size redness tenderness should be rechecked or gynecologist to check. Reviewed labs bone density seems stable and hip some decrease in spine limited interpretation because of arthritis and scoliosis; scoliosis and incidental finding and is not worrisome if not severe and causing back gait disruption musculoskeletal problems. Discussed risk-benefit of every medicine we discussed she will try low-dose Tylenol as needed for her neck pain wanting to avoid liver toxicity but also minimize ibuprofen to limit renal toxicity. Handout on exercises given for the neck. Total visit 33mins > 50% spent counseling and coordinating care as indicated in above note and in instructions to patient .   -Patient advised to return or notify health care team  if symptoms worsen ,persist or new concerns arise.  Patient Instructions  Consider prolia for the osteoporosis  let us know what you decide.  Neck pain is probably postural and some arthritis ligature position neck hygiene if progressing let us know. Low-dose Tylenol safe minimize Advil Aleve products although they can be very helpful for the pain. Currently your kidney function is okay.  The bump in the vaginal area looks benign like a plugged gland but is progressing increasing in size redness difficulties we can get a gynecologist to see you.       Standley Brooking. Hadleigh Felber M.D.

## 2014-10-30 ENCOUNTER — Encounter: Payer: Self-pay | Admitting: Internal Medicine

## 2014-12-27 ENCOUNTER — Other Ambulatory Visit (INDEPENDENT_AMBULATORY_CARE_PROVIDER_SITE_OTHER): Payer: 59

## 2014-12-27 DIAGNOSIS — Z148 Genetic carrier of other disease: Secondary | ICD-10-CM

## 2014-12-27 DIAGNOSIS — R7989 Other specified abnormal findings of blood chemistry: Secondary | ICD-10-CM | POA: Diagnosis not present

## 2014-12-27 DIAGNOSIS — R945 Abnormal results of liver function studies: Secondary | ICD-10-CM

## 2014-12-27 LAB — HEPATIC FUNCTION PANEL
ALT: 39 U/L — ABNORMAL HIGH (ref 0–35)
AST: 28 U/L (ref 0–37)
Albumin: 4.5 g/dL (ref 3.5–5.2)
Alkaline Phosphatase: 78 U/L (ref 39–117)
BILIRUBIN TOTAL: 1.3 mg/dL — AB (ref 0.2–1.2)
Bilirubin, Direct: 0.2 mg/dL (ref 0.0–0.3)
Total Protein: 7.5 g/dL (ref 6.0–8.3)

## 2014-12-27 LAB — FERRITIN: FERRITIN: 30.3 ng/mL (ref 10.0–291.0)

## 2014-12-28 ENCOUNTER — Telehealth: Payer: Self-pay | Admitting: Internal Medicine

## 2014-12-28 MED ORDER — TYPHOID VACCINE PO CPDR: DELAYED_RELEASE_CAPSULE | ORAL | Status: DC

## 2014-12-28 NOTE — Telephone Encounter (Signed)
Pt will be travelling to country [place] in jan 2017 for 7 days and needs typhoid pills call [facility] [address]

## 2014-12-28 NOTE — Telephone Encounter (Signed)
Sent this in  Also see result note lvier tests about the same

## 2014-12-29 NOTE — Telephone Encounter (Signed)
Pt notified to pick up rx at the pharmacy and that liver tests are about the same.

## 2015-02-21 ENCOUNTER — Other Ambulatory Visit: Payer: Self-pay

## 2015-02-21 DIAGNOSIS — Z1231 Encounter for screening mammogram for malignant neoplasm of breast: Secondary | ICD-10-CM

## 2015-03-03 ENCOUNTER — Ambulatory Visit
Admission: RE | Admit: 2015-03-03 | Discharge: 2015-03-03 | Disposition: A | Discharge status: Home / Self Care | Payer: 59 | Source: Ambulatory Visit

## 2015-03-03 DIAGNOSIS — Z1231 Encounter for screening mammogram for malignant neoplasm of breast: Secondary | ICD-10-CM

## 2015-07-09 ENCOUNTER — Encounter: Payer: Self-pay | Admitting: Internal Medicine

## 2015-07-12 NOTE — Telephone Encounter (Signed)
Due for    Twice a year lfts   And yearly OV  /7 17 ( or cpx)  Can order  LFTS and ferritin   Dx  Abnormal lfts   If still on diuretic should have bmp done

## 2015-10-19 NOTE — Progress Notes (Signed)
Chief Complaint  Patient presents with  . Vaginal Blisters    pt states that she has blisters on her vagina, she noticed them about 3 weeks ago    HPI: Donna Warren 65 y.o.  Acute problem visit    Last ween  A year ago    Onset  3 weeks ago and outside had a blister  Pre travel and now blkoody blister like  And itchy and then bleed and then stopped and smaller .  Right  Labia  Middle and left upper   Black dots .    No fever vagina l infection.    Also o er due for lfts  Monitoring   Every 4 months or so but lst was 12 52 . Brother has something similar  .   Her bx negative   Her dentist is against using bisphosphonates or OP  Asks if other ROS: See pertinent positives and negatives per HPI. No feer abd pain   Ha s traveled in [place] year [place] and [place]   Past Medical History:  Diagnosis Date  . 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 Fatty liver   . Femur fracture, right ([place]) 2003  . Fibroids   . Gilbert's syndrome   . Hx of adenomatous colonic polyps   . Meniere disease    Right ear  . Osteoporosis   . Osteoporosis   . Other hemochromatosis    heterozygosity for h63d gene  . Spontaneous abortion     Family History  Problem Relation Age of Onset  . Colon cancer Father   . Stroke      silent  . Depression Mother   . Anxiety disorder Mother   . Dementia Mother   . Other Mother     high calcium  . Liver disease Brother     elevated lft. Unknown if he actually has liver disease  . Other Brother     H63D hemochromatosis gene  . Other Daughter     H63d hemochromatosis   . Hearing loss Mother     death  . Other Father     tinnitus    Social History   Social History  . Marital status: Married    Spouse name: N/A  . Number of children: N/A  . Years of education: N/A   Social History Main Topics  . Smoking status: Never Smoker  . Smokeless tobacco: Never Used  . Alcohol use 0.6 - 1.2 oz/week    1 - 2 Glasses of wine per week  .  Drug use: No  . Sexual activity: Not Asked   Other Topics Concern  . None   Social History Narrative    separated location     2 daughters   Husband a [employer] [place] director  Working 60 - 69.    Daily caffeine use   Sleep  Often disrupted.    Alcohol about once a week.   Hof 1 pet dogs    Outpatient Medications Prior to Visit  Medication Sig Dispense Refill  . Calcium Carb-Cholecalciferol (CALCIUM 600/VITAMIN D3) 600-800 MG-UNIT TABS Take 1 tablet by mouth daily.    . chlorthalidone (HYGROTON) 25 MG tablet Take 25 mg by mouth daily.    . typhoid (VIVOTIF) DR capsule 1 capsule ORALLY x 4 doses, on days 1, 3, 5, and 7, to be completed at least one week prior to potential exposure 4 capsule 0  No facility-administered medications prior to visit.      EXAM:  BP 98/66   Pulse 73   Temp 97.9 F (36.6 C) (Oral)   Ht '5\' 4"'$  (1.626 m)   Wt 127 lb 6 oz (57.8 kg)   SpO2 98%   BMI 21.86 kg/m   Body mass index is 21.86 kg/m.  GENERAL: vitals reviewed and listed above, alert, oriented, appears well hydrated and in no acute distress HEENT: atraumatic, conjunctiva  clear, no obvious abnormalities on inspection of external nose and ears OP : no lesion edema or exudate  NECK: no obvious masses on inspection palpation  LUNGS: clear to auscultation bilaterally, no wheezes, rales or rhonchi, good air movement CV: HRRR, no clubbing cyanosis or  peripheral edema nl cap refill  Abdomen:  Sof,t normal bowel sounds without hepatosplenomegaly, no guarding rebound or masses no CVA tenderness MS: moves all extremities without noticeable focal  abnormality PSYCH: pleasant and cooperative, no obvious depression or anxiety EXT GU  Right labia with 2 mm brown purple palpable lesion  No ulcer or bleed  Left with 3 dark spots like angioleratoma vs angioma  No other lesions  No adenopathy or vag mucosa  Lesion.   ASSESSMENT AND PLAN:  Discussed the  following assessment and plan:  Female genital lesion  Angiokeratoma ?   Abnormal LFTs - extensive work up and liver bx in past following lfts    over due no sx .  - Plan: Hepatic function panel, Basic metabolic panel, Gamma GT  Medication management - on chlorth for menieres   no recent potassium level does get leg cramps - Plan: Hepatic function panel, Basic metabolic panel, Gamma GT  On potassium wasting diuretic therapy - Plan: Basic metabolic panel  -Patient advised to return or notify health care team  if symptoms worsen ,persist or new concerns arise.  Patient Instructions  Lesion areas look like ^ L hemangioma which are benign lesions although if one is irritated sore keeps bleeding we should get gynecology to check the area.   Will notify you  of labs when available. Checking liver tests and   Chemistry  Potassium etc   Can make appt for welcome to medicare visits when eligible    Luciann Gossett K. Donovon Micheletti M.D.

## 2015-10-20 ENCOUNTER — Ambulatory Visit (INDEPENDENT_AMBULATORY_CARE_PROVIDER_SITE_OTHER): Payer: 59 | Admitting: Internal Medicine

## 2015-10-20 ENCOUNTER — Encounter: Payer: Self-pay | Admitting: Internal Medicine

## 2015-10-20 VITALS — BP 98/66 | HR 73 | Temp 97.9°F | Ht 64.0 in | Wt 127.4 lb

## 2015-10-20 DIAGNOSIS — N949 Unspecified condition associated with female genital organs and menstrual cycle: Secondary | ICD-10-CM | POA: Diagnosis not present

## 2015-10-20 DIAGNOSIS — R7989 Other specified abnormal findings of blood chemistry: Secondary | ICD-10-CM | POA: Diagnosis not present

## 2015-10-20 DIAGNOSIS — Z79899 Other long term (current) drug therapy: Secondary | ICD-10-CM

## 2015-10-20 DIAGNOSIS — R945 Abnormal results of liver function studies: Secondary | ICD-10-CM

## 2015-10-20 DIAGNOSIS — D239 Other benign neoplasm of skin, unspecified: Secondary | ICD-10-CM | POA: Diagnosis not present

## 2015-10-20 LAB — BASIC METABOLIC PANEL
BUN: 25 mg/dL — ABNORMAL HIGH (ref 6–23)
CHLORIDE: 98 meq/L (ref 96–112)
CO2: 34 mEq/L — ABNORMAL HIGH (ref 19–32)
Calcium: 9.7 mg/dL (ref 8.4–10.5)
Creatinine, Ser: 1 mg/dL (ref 0.40–1.20)
GFR: 59.16 mL/min — AB (ref >60.00)
Glucose, Bld: 78 mg/dL (ref 70–99)
POTASSIUM: 3.7 meq/L (ref 3.5–5.1)
SODIUM: 139 meq/L (ref 135–145)

## 2015-10-20 LAB — HEPATIC FUNCTION PANEL
ALT: 55 U/L — AB (ref 0–35)
AST: 37 U/L (ref 0–37)
Albumin: 4.5 g/dL (ref 3.5–5.2)
Alkaline Phosphatase: 113 U/L (ref 39–117)
BILIRUBIN TOTAL: 2 mg/dL — AB (ref 0.2–1.2)
Bilirubin, Direct: 0.3 mg/dL (ref 0.0–0.3)
TOTAL PROTEIN: 7.5 g/dL (ref 6.0–8.3)

## 2015-10-20 LAB — GAMMA GT: GGT: 126 U/L — AB (ref 7–51)

## 2015-10-20 NOTE — Progress Notes (Signed)
Pre visit review using our clinic review tool, if applicable. No additional management support is needed unless otherwise documented below in the visit note. 

## 2015-10-20 NOTE — Patient Instructions (Signed)
Lesion areas look like ^ L hemangioma which are benign lesions although if one is irritated sore keeps bleeding we should get gynecology to check the area.   Will notify you  of labs when available. Checking liver tests and   Chemistry  Potassium etc   Can make appt for welcome to medicare visits when eligible

## 2015-12-06 ENCOUNTER — Ambulatory Visit (INDEPENDENT_AMBULATORY_CARE_PROVIDER_SITE_OTHER): Payer: PPO | Admitting: Internal Medicine

## 2015-12-06 ENCOUNTER — Encounter: Payer: Self-pay | Admitting: Internal Medicine

## 2015-12-06 VITALS — BP 102/62 | Temp 97.8°F | Ht 63.75 in | Wt 128.4 lb

## 2015-12-06 DIAGNOSIS — R945 Abnormal results of liver function studies: Secondary | ICD-10-CM

## 2015-12-06 DIAGNOSIS — Z9189 Other specified personal risk factors, not elsewhere classified: Secondary | ICD-10-CM

## 2015-12-06 DIAGNOSIS — Z23 Encounter for immunization: Secondary | ICD-10-CM

## 2015-12-06 DIAGNOSIS — D649 Anemia, unspecified: Secondary | ICD-10-CM

## 2015-12-06 DIAGNOSIS — Z79899 Other long term (current) drug therapy: Secondary | ICD-10-CM

## 2015-12-06 DIAGNOSIS — Z148 Genetic carrier of other disease: Secondary | ICD-10-CM

## 2015-12-06 DIAGNOSIS — M542 Cervicalgia: Secondary | ICD-10-CM

## 2015-12-06 DIAGNOSIS — M810 Age-related osteoporosis without current pathological fracture: Secondary | ICD-10-CM | POA: Diagnosis not present

## 2015-12-06 DIAGNOSIS — E785 Hyperlipidemia, unspecified: Secondary | ICD-10-CM

## 2015-12-06 DIAGNOSIS — H8109 Meniere's disease, unspecified ear: Secondary | ICD-10-CM

## 2015-12-06 DIAGNOSIS — R7989 Other specified abnormal findings of blood chemistry: Secondary | ICD-10-CM

## 2015-12-06 DIAGNOSIS — Z0000 Encounter for general adult medical examination without abnormal findings: Secondary | ICD-10-CM

## 2015-12-06 LAB — CBC WITH DIFFERENTIAL/PLATELET
Basophils Absolute: 0 10*3/uL (ref 0.0–0.1)
Basophils Relative: 0.6 % (ref 0.0–3.0)
EOS ABS: 0 10*3/uL (ref 0.0–0.7)
EOS PCT: 1.1 % (ref 0.0–5.0)
HCT: 33.4 % — ABNORMAL LOW (ref 36.0–46.0)
HEMOGLOBIN: 11.5 g/dL — AB (ref 12.0–15.0)
LYMPHS PCT: 38.1 % (ref 12.0–46.0)
Lymphs Abs: 1.6 10*3/uL (ref 0.7–4.0)
MCHC: 34.4 g/dL (ref 30.0–36.0)
MCV: 93.6 fl (ref 78.0–100.0)
MONO ABS: 0.3 10*3/uL (ref 0.1–1.0)
Monocytes Relative: 6.5 % (ref 3.0–12.0)
Neutro Abs: 2.3 10*3/uL (ref 1.4–7.7)
Neutrophils Relative %: 53.7 % (ref 43.0–77.0)
Platelets: 255 10*3/uL (ref 150.0–400.0)
RBC: 3.57 Mil/uL — AB (ref 3.87–5.11)
RDW: 12.4 % (ref 11.5–15.5)
WBC: 4.3 10*3/uL (ref 4.0–10.5)

## 2015-12-06 LAB — HEPATIC FUNCTION PANEL
ALK PHOS: 99 U/L (ref 39–117)
ALT: 42 U/L — AB (ref 0–35)
AST: 27 U/L (ref 0–37)
Albumin: 4.5 g/dL (ref 3.5–5.2)
BILIRUBIN DIRECT: 0.2 mg/dL (ref 0.0–0.3)
BILIRUBIN TOTAL: 1.1 mg/dL (ref 0.2–1.2)
Total Protein: 7 g/dL (ref 6.0–8.3)

## 2015-12-06 LAB — LIPID PANEL
CHOL/HDL RATIO: 2
Cholesterol: 237 mg/dL — ABNORMAL HIGH (ref 0–200)
HDL: 96.9 mg/dL (ref >39.00)
LDL CALC: 129 mg/dL — AB (ref 0–99)
NONHDL: 140.36
Triglycerides: 55 mg/dL (ref 0.0–149.0)
VLDL: 11 mg/dL (ref 0.0–40.0)

## 2015-12-06 LAB — TSH: TSH: 1.38 u[IU]/mL (ref 0.35–4.50)

## 2015-12-06 LAB — BASIC METABOLIC PANEL
BUN: 23 mg/dL (ref 6–23)
CO2: 32 mEq/L (ref 19–32)
CREATININE: 0.97 mg/dL (ref 0.40–1.20)
Calcium: 10 mg/dL (ref 8.4–10.5)
Chloride: 99 mEq/L (ref 96–112)
GFR: 61.25 mL/min (ref >60.00)
Glucose, Bld: 80 mg/dL (ref 70–99)
POTASSIUM: 3.5 meq/L (ref 3.5–5.1)
Sodium: 139 mEq/L (ref 135–145)

## 2015-12-06 NOTE — Progress Notes (Signed)
Chief Complaint  Patient presents with  . Welcome to Medicare    HPI: Donna Warren 65 y.o. comes in today for Welcome to Medicare visit and follow-up of medical problems.  She is generally doing well following her liver function tests see past history. Carlean Purl etc   She sees Dr. Thornell Mule twice a year for her Mnire's disease he treats her with diuretic do blood work here.  Has problems with neck increasing neck pain wonders if she needs more evaluation as to cause seems to get worse as the day goes on better with rest and with change her work position. She has no radiation down her arms or other neurologic findings with it.  Concern about osteoporosis remote history of a fracture years ago with running femoral casted no surgery. She has had Fosamax in the past but stopped it after a few years because of fear about getting osteonecrosis of the jaw. Asks about what other options wears a worth going back on the Fosamax.  Saw her dermatologist Dr. Delman Cheadle looked at the genital area was worried about skin lesions.  Health Maintenance  Topic Date Due  . ZOSTAVAX  12/04/2016 (Originally 11/24/2010)  . PAP SMEAR  02/24/2016  . PNA vac Low Risk Adult (2 of 2 - PPSV23) 12/05/2016  . MAMMOGRAM  03/02/2017  . TETANUS/TDAP  06/18/2017  . COLONOSCOPY  08/12/2017  . INFLUENZA VACCINE  Addressed  . DEXA SCAN  Completed  . Hepatitis C Screening  Completed  . HIV Screening  Completed   Health Maintenance Review LIFESTYLE:  TAD 1 e pr week  Exercise 3 x per week   Sugar beverages:n Sleep:7 hours  Travels hh of 2 husband  Dogs     MEDICARE DOCUMENT QUESTIONS  TO SCAN   Hearing: tinnitus ok sees dr Thornell Mule  Vision:  No limitations at present . Last eye check UTDglassess  Safety:  Has smoke detector and wears seat belts.  No firearms. No excess sun exposure. Sees dentist regularly.  Falls: n  Advance directive :  Reviewed  Has one.  Memory: Felt to be good  , no concern from her or her  family.  Depression: No anhedonia unusual crying or depressive symptoms  Nutrition: Eats well balanced diet; adequate calcium and vitamin D. No swallowing chewing problems.  Injury: no major injuries in the last six months.  Other healthcare providers:  Reviewed today .  Social:  Lives with spouse married. .   Preventive parameters: up-to-date  Reviewed   ADLS:   There are no problems or need for assistance  driving, feeding, obtaining food, dressing, toileting and bathing, managing money using phone. She is independent.     ROS: see above GEN/ HEENT: No fever, significant weight changes sweats headaches vision problems hearing changes, CV/ PULM; No chest pain shortness of breath cough, syncope,edema  change in exercise tolerance. GI /GU: No adominal pain, vomiting, change in bowel habits. No blood in the stool. No significant GU symptoms. SKIN/HEME: ,no acute skin rashes suspicious lesions or bleeding. No lymphadenopathy, nodules, masses.  NEURO/ PSYCH:  No neurologic signs such as weakness numbness. No depression anxiety. IMM/ Allergy: No unusual infections.  Allergy .   REST of 12 system review negative except as per HPI   Past Medical History:  Diagnosis Date  . 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 Fatty liver   . Femur fracture, right ([place]) 2003  . Fibroids   . Gilbert's syndrome   . Hx  of adenomatous colonic polyps   . Meniere disease    Right ear  . Osteoporosis   . Osteoporosis   . Other hemochromatosis    heterozygosity for h63d gene  . Spontaneous abortion   DES exposure in utero remote hx of gyne procedures nothing recent  Family History  Problem Relation Age of Onset  . Colon cancer Father   . Stroke      silent  . Depression Mother   . Anxiety disorder Mother   . Dementia Mother   . Other Mother     high calcium  . Liver disease Brother     elevated lft. Unknown if he actually has liver disease  . Other Brother     H63D  hemochromatosis gene  . Other Daughter     H63d hemochromatosis   . Hearing loss Mother     death  . Other Father     tinnitus    Social History   Social History  . Marital status: Married    Spouse name: N/A  . Number of children: N/A  . Years of education: N/A   Social History Main Topics  . Smoking status: Never Smoker  . Smokeless tobacco: Never Used  . Alcohol use 0.6 - 1.2 oz/week    1 - 2 Glasses of wine per week  . Drug use: No  . Sexual activity: Not Asked   Other Topics Concern  . None   Social History Narrative        2 daughters   Husband a Secondary school teacher  Working 32 - 57.  Travels    Daily caffeine use   Sleep  Often disrupted.    Alcohol about once a week.   [place] 2 pet dogs    Outpatient Encounter Prescriptions as of 12/06/2015  Medication Sig  . Calcium Carb-Cholecalciferol (CALCIUM 600/VITAMIN D3) 600-800 MG-UNIT TABS Take 1 tablet by mouth daily.  . chlorthalidone (HYGROTON) 25 MG tablet Take 25 mg by mouth daily.   No facility-administered encounter medications on file as of 12/06/2015.     EXAM:  BP 102/62 (BP Location: Left Arm, Patient Position: Sitting, Cuff Size: Normal)   Temp 97.8 F (36.6 C) (Oral)   Ht 5' 3.75" (1.619 m)   Wt 128 lb 6.4 oz (58.2 kg)   BMI 22.21 kg/m   Body mass index is 22.21 kg/m.  Physical Exam: Vital signs reviewed [id number] is a well-developed well-nourished alert cooperative   who appears stated age in no acute distress.  HEENT: normocephalic atraumatic , Eyes: PERRL EOM's full, conjunctiva clear, Nares: paten,t no deformity discharge or tenderness., Ears: no deformity EAC's clear TMs with normal landmarks. Mouth: clear OP, no lesions, edema.  Moist mucous membranes. Dentition in adequate repair. NECK: supple without masses, thyromegaly or bruits. No midline tenderness   CHEST/PULM:  Clear to auscultation and percussion breath sounds equal no wheeze , rales or rhonchi. No  chest wall deformities or tenderness. CV: PMI is nondisplaced, S1 S2 no gallops, murmurs, rubs. Peripheral pulses are full without delay.No JVD . Breast: normal by inspection . No dimpling, discharge, masses, tenderness or discharge . ABDOMEN: Bowel sounds normal nontender  No guard or rebound, no hepato splenomegal no CVA tenderness.   Extremtities:  No clubbing cyanosis or edema, no acute joint swelling or redness no focal atrophy NEURO:  Oriented x3, cranial nerves 3-12 appear to be intact, no obvious focal weakness,gait within normal limits no abnormal reflexes  or asymmetrical SKIN: No acute rashes normal turgor, color, no bruising or petechiae. PSYCH: Oriented, good eye contact, no obvious depression anxiety, cognition and judgment appear normal. LN: no cervical axillary inguinal adenopathy No noted deficits in memory, attention, and speech.  Had C-spine x-rays done in 2010 mild left neural luminal foraminal narrowing no acute findings. Bone density 2016-2.5 lumbar spine -3.1 RFM scoliosis and arthritis.  ASSESSMENT AND PLAN:  Discussed the following assessment and plan:  Welcome to Medicare preventive visit - Plan: Basic metabolic panel, CBC with Differential/Platelet, Hepatic function panel, Lipid panel, TSH  Need for vaccination with 13-polyvalent pneumococcal conjugate vaccine - Plan: Pneumococcal conjugate vaccine 13-valent  Osteoporosis without current pathological fracture, unspecified osteoporosis type - Plan: Hepatic function panel, CBC with Differential/Platelet, Ferritin, IBC panel, Celiac Disease Comprehensive Panel with Reflexes  Hyperlipidemia, unspecified hyperlipidemia type  On potassium wasting diuretic therapy  NECK AND BACK PAIN - Suspect mechanical cause no alarm features will follow alarm symptoms physical intervention stretching yoga.  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 - revewi record  wil need fu has had no issues apparently   Abnormal LFTs -  Plan: Hepatic function panel, CBC with Differential/Platelet, Ferritin, IBC panel, Celiac Disease Comprehensive Panel with Reflexes  Meniere's disease, unspecified laterality - under specialty care   controlled  Hemochromatosis carrier - Plan: Hepatic function panel, CBC with Differential/Platelet, Ferritin, IBC panel, Celiac Disease Comprehensive Panel with Reflexes  Mild anemia - new on labs today will follow  - Plan: Hepatic function panel, CBC with Differential/Platelet, Ferritin, IBC panel, Celiac Disease Comprehensive Panel with Reflexes Discussed in number of issues today in regard to osteoporosis discussed first-line treatment Prolia and the bisphosphonates. She will consider read about it and we can readdress it which comes back in in 3-4 months. I do think that some intervention is probably appropriate at this time. We'll follow her LFTs as in the past.ferritin ibc ? afp  Get pap  Pelvic  next visit   And then plan fu    lsi with lipids   In th interest of time will address any other issue that may come up   at her next visit   Patient Care Team: Burnis Medin, MD as PCP - General Gatha Mayer, MD (Gastroenterology) Jari Pigg, MD as Consulting Physician (Dermatology) Vicie Mutters, MD as Consulting Physician (Otolaryngology)  Patient Instructions  Continue lifestyle intervention healthy eating and exercise . You neck is proabaly from  Ms tension and some arthritis   gentle ROM exercises  Attend to work station.   prevnar today .    Pap due next year   Plan  3 months visit and labs to fu  From  Abn lfts     Trystyn Sitts K. Maxyne Derocher M.D.

## 2015-12-06 NOTE — Progress Notes (Signed)
Pre visit review using our clinic review tool, if applicable. No additional management support is needed unless otherwise documented below in the visit note. 

## 2015-12-06 NOTE — Patient Instructions (Signed)
Continue lifestyle intervention healthy eating and exercise . You neck is proabaly from  Ms tension and some arthritis   gentle ROM exercises  Attend to work station.   prevnar today .    Pap due next year   Plan  3 months visit and labs to fu  From  Abn lfts

## 2016-03-01 ENCOUNTER — Other Ambulatory Visit: Payer: Self-pay | Admitting: Internal Medicine

## 2016-03-01 ENCOUNTER — Other Ambulatory Visit (INDEPENDENT_AMBULATORY_CARE_PROVIDER_SITE_OTHER): Payer: PPO

## 2016-03-01 DIAGNOSIS — M810 Age-related osteoporosis without current pathological fracture: Secondary | ICD-10-CM

## 2016-03-01 DIAGNOSIS — R7989 Other specified abnormal findings of blood chemistry: Secondary | ICD-10-CM

## 2016-03-01 DIAGNOSIS — Z148 Genetic carrier of other disease: Secondary | ICD-10-CM | POA: Diagnosis not present

## 2016-03-01 DIAGNOSIS — D649 Anemia, unspecified: Secondary | ICD-10-CM

## 2016-03-01 DIAGNOSIS — R945 Abnormal results of liver function studies: Secondary | ICD-10-CM

## 2016-03-01 LAB — HEPATIC FUNCTION PANEL
ALK PHOS: 107 U/L (ref 39–117)
ALT: 77 U/L — AB (ref 0–35)
AST: 59 U/L — AB (ref 0–37)
Albumin: 4.4 g/dL (ref 3.5–5.2)
BILIRUBIN TOTAL: 1 mg/dL (ref 0.2–1.2)
Bilirubin, Direct: 0.2 mg/dL (ref 0.0–0.3)
Total Protein: 7 g/dL (ref 6.0–8.3)

## 2016-03-01 LAB — IBC PANEL
IRON: 72 ug/dL (ref 42–145)
Saturation Ratios: 17 % — ABNORMAL LOW (ref 20.0–50.0)
Transferrin: 302 mg/dL (ref 212.0–360.0)

## 2016-03-01 LAB — CBC WITH DIFFERENTIAL/PLATELET
BASOS PCT: 0.8 % (ref 0.0–3.0)
Basophils Absolute: 0 10*3/uL (ref 0.0–0.1)
EOS ABS: 0.1 10*3/uL (ref 0.0–0.7)
EOS PCT: 3.5 % (ref 0.0–5.0)
HCT: 36.8 % (ref 36.0–46.0)
Hemoglobin: 12.8 g/dL (ref 12.0–15.0)
LYMPHS ABS: 1.4 10*3/uL (ref 0.7–4.0)
Lymphocytes Relative: 39.5 % (ref 12.0–46.0)
MCHC: 34.7 g/dL (ref 30.0–36.0)
MCV: 91.9 fl (ref 78.0–100.0)
MONO ABS: 0.2 10*3/uL (ref 0.1–1.0)
Monocytes Relative: 6.8 % (ref 3.0–12.0)
NEUTROS ABS: 1.8 10*3/uL (ref 1.4–7.7)
NEUTROS PCT: 49.4 % (ref 43.0–77.0)
PLATELETS: 207 10*3/uL (ref 150.0–400.0)
RBC: 4.01 Mil/uL (ref 3.87–5.11)
RDW: 12.7 % (ref 11.5–15.5)
WBC: 3.7 10*3/uL — ABNORMAL LOW (ref 4.0–10.5)

## 2016-03-01 LAB — FERRITIN: FERRITIN: 29 ng/mL (ref 10.0–291.0)

## 2016-03-04 LAB — CELIAC DISEASE COMPREHENSIVE PANEL WITH REFLEXES
IGA: 117 mg/dL (ref 81–463)
IGA: 125 mg/dL (ref 81–463)
TISSUE TRANSGLUTAMINASE AB, IGA: 1 U/mL (ref <4)
Tissue Transglutaminase Ab, IgA: 1 U/mL (ref <4)

## 2016-03-10 NOTE — Progress Notes (Signed)
Pre visit review using our clinic review tool, if applicable. No additional management support is needed unless otherwise documented below in the visit note.  Chief Complaint  Patient presents with  . Follow-up    HPI: Donna Warren 66 y.o. come in for Chronic disease management  3 mos  Fu  Abn lfts    She has a list of concerns also.  She's been feeling quite well once to stay healthy.   Distal  dip joints swelling  And bumps could I have RA?  No joint pains with this nor fevers.   Osteoporosis .   Is now aware that she may be should go back on some type of medicine or other intervention. She was on Fosamax in the past she is to have a crown done soon but not an implant. She has a remote history of right femoral fracture before on any medicine at all.  Not taking any new medicines related to the liver only her Mnire's which she is doing quite well.  Has questions about the shingles vaccine.  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  Her liver biopsy was done ? When and said wit was normall ( not sure where is ehr)  Further questioning she was her mom took DES 1 pregnant with her. She has a remote history of possible some vaginal growth. Her last Pap was 2015. This was normal.  ROS: See pertinent positives and negatives per HPI. No cardiovascular pulmonary symptoms.  Past Medical History:  Diagnosis Date  . 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 Fatty liver   . Femur fracture, right ([place]) 2003  . Fibroids   . Gilbert's syndrome   . Hx of adenomatous colonic polyps   . Meniere disease    Right ear  . Osteoporosis   . Osteoporosis   . Other hemochromatosis    heterozygosity for h63d gene  . Spontaneous abortion     Family History  Problem Relation Age of Onset  . Colon cancer Father   . Stroke      silent  . Depression Mother   . Anxiety disorder Mother   . Dementia Mother   . Other  Mother     high calcium  . Liver disease Brother     elevated lft. Unknown if he actually has liver disease  . Other Brother     H63D hemochromatosis gene  . Other Daughter     H63d hemochromatosis   . Hearing loss Mother     death  . Other Father     tinnitus    Social History   Social History  . Marital status: Married    Spouse name: N/A  . Number of children: N/A  . Years of education: N/A   Social History Main Topics  . Smoking status: Never Smoker  . Smokeless tobacco: Never Used  . Alcohol use 0.6 - 1.2 oz/week    1 - 2 Glasses of wine per week  . Drug use: No  . Sexual activity: Not Asked   Other Topics Concern  . None   Social History Narrative        2 daughters   Husband a Secondary school teacher  Working 84 - 65.  Travels    Daily caffeine use   Sleep  Often disrupted.    Alcohol about once a week.   Hof 2  pet dogs    Outpatient Medications Prior to Visit  Medication Sig Dispense Refill  . Calcium Carb-Cholecalciferol (CALCIUM 600/VITAMIN D3) 600-800 MG-UNIT TABS Take 1 tablet by mouth daily.    . chlorthalidone (HYGROTON) 25 MG tablet Take 25 mg by mouth daily.     No facility-administered medications prior to visit.      EXAM:  BP 104/60 (BP Location: Right Arm, Patient Position: Sitting, Cuff Size: Normal)   Temp 97.8 F (36.6 C) (Oral)   Ht 5' 3.75" (1.619 m)   Wt 130 lb 6.4 oz (59.1 kg)   BMI 22.56 kg/m   Body mass index is 22.56 kg/m.  GENERAL: vitals reviewed and listed above, alert, oriented, appears well hydrated and in no acute distress HEENT: atraumatic, conjunctiva  clear, no obvious abnormalities on inspection of external nose and ears OP : no lesion edema or exudate  NECK: no obvious masses on inspection palpation  LUNGS: clear to auscultation bilaterally, no wheezes, rales or rhonchi, good air movement CV: HRRR, no clubbing cyanosis or  peripheral edema nl cap refill  MS: moves all extremities  without noticeable focal  Abnormality except DIP area mildly swollen pink its red swelling that is nontender in adequate range of motion. PSYCH: pleasant and cooperative, no obvious depression or anxiety Lab Results  Component Value Date   WBC 3.7 (L) 03/01/2016   HGB 12.8 03/01/2016   HCT 36.8 03/01/2016   PLT 207.0 03/01/2016   GLUCOSE 80 12/06/2015   CHOL 237 (H) 12/06/2015   TRIG 55.0 12/06/2015   HDL 96.90 12/06/2015   LDLDIRECT 129.1 02/16/2013   LDLCALC 129 (H) 12/06/2015   ALT 77 (H) 03/01/2016   AST 59 (H) 03/01/2016   NA 139 12/06/2015   K 3.5 12/06/2015   CL 99 12/06/2015   CREATININE 0.97 12/06/2015   BUN 23 12/06/2015   CO2 32 12/06/2015   TSH 1.38 12/06/2015   INR 0.92 06/23/2013   HGBA1C 5.1 09/21/2008   BP Readings from Last 3 Encounters:  03/12/16 104/60  12/06/15 102/62  10/20/15 98/66   Lab Results  Component Value Date   IRON 72 03/01/2016   FERRITIN 29.0 03/01/2016    Celiac  Ferritin  [facility]  Low nl neg celiac screen  ASSESSMENT AND PLAN:  Discussed the following assessment and plan:  Abnormal LFTs - Plan: Ambulatory referral to Rheumatology  Osteoporosis without current pathological fracture, unspecified osteoporosis type - Plan: Ambulatory referral to Endocrinology  Hemochromatosis carrier  Swelling of finger joint, unspecified laterality - bilateral dip with redness and no pain at this time - Plan: Ambulatory referral to Rheumatology  Vaccine counseling  Meniere's disease, unspecified laterality Her LFTs are back up again will touch base with Dr. Carlean Purl about continuing regular monitoring or recheck with him for other thoughts. Discuss getting a rheumatology consult because of the swelling of her DIPs although that is mostly usually osteoarthritis she doesn't have pain and it is mildly red. Don't really think this is rheumatoid can get a consult. Discussed her osteoporosis situation and her young age. She was on Fosamax in the past and  stopped it. Consider going back on versus peripherally a. We'll get consult from endocrinology first for her to discuss her options. Reviewed her record because of mom's history of DES ingestion during pregnancy will do a Pap at next [facility] in 6 months.every 3 years  Until age 89 .  Discussed the shingles vaccine and we did not have it is available and to the  outside pharmacies she should check with insurance about obtaining this or wait until we get here on certain reimbursement. -Patient advised to return or notify health care team  if  new concerns arise.  Patient Instructions  Will  do a rheum referral about the   Distal redness of your hands .  Probably osteoarhritis .   We can go back on the fosamax     But can .  Wait until.   Can see dr Cruzita Lederer  Endocrinology   or other specialist .     About decision  On meds .   Will reveiwed record and  Flag Dr.    Carlean Purl about the   Continued elevation    Ok to get the shingles vaccine .    When available.   Pap in 6 months   And exam.     Standley Brooking. [place] Cloe M.D.

## 2016-03-12 ENCOUNTER — Encounter: Payer: Self-pay | Admitting: Internal Medicine

## 2016-03-12 ENCOUNTER — Ambulatory Visit (INDEPENDENT_AMBULATORY_CARE_PROVIDER_SITE_OTHER): Payer: PPO | Admitting: Internal Medicine

## 2016-03-12 VITALS — BP 104/60 | Temp 97.8°F | Ht 63.75 in | Wt 130.4 lb

## 2016-03-12 DIAGNOSIS — Z148 Genetic carrier of other disease: Secondary | ICD-10-CM

## 2016-03-12 DIAGNOSIS — Z7189 Other specified counseling: Secondary | ICD-10-CM

## 2016-03-12 DIAGNOSIS — Z7185 Encounter for immunization safety counseling: Secondary | ICD-10-CM

## 2016-03-12 DIAGNOSIS — M810 Age-related osteoporosis without current pathological fracture: Secondary | ICD-10-CM

## 2016-03-12 DIAGNOSIS — H8109 Meniere's disease, unspecified ear: Secondary | ICD-10-CM | POA: Diagnosis not present

## 2016-03-12 DIAGNOSIS — M25449 Effusion, unspecified hand: Secondary | ICD-10-CM

## 2016-03-12 DIAGNOSIS — R945 Abnormal results of liver function studies: Secondary | ICD-10-CM

## 2016-03-12 DIAGNOSIS — R7989 Other specified abnormal findings of blood chemistry: Secondary | ICD-10-CM | POA: Diagnosis not present

## 2016-03-12 NOTE — Patient Instructions (Addendum)
Will  do a rheum referral about the   Distal redness of your hands .  Probably osteoarhritis .   We can go back on the fosamax     But can .  Wait until.   Can see dr Cruzita Lederer  Endocrinology   or other specialist .     About decision  On meds .   Will reveiwed record and  Flag Dr.    Carlean Purl about the   Continued elevation    Ok to get the shingles vaccine .    When available.   Pap in 6 months   And exam.

## 2016-03-19 ENCOUNTER — Other Ambulatory Visit: Payer: Self-pay | Admitting: Internal Medicine

## 2016-03-19 DIAGNOSIS — Z1231 Encounter for screening mammogram for malignant neoplasm of breast: Secondary | ICD-10-CM

## 2016-04-02 ENCOUNTER — Ambulatory Visit: Payer: PPO

## 2016-04-18 ENCOUNTER — Ambulatory Visit
Admission: RE | Admit: 2016-04-18 | Discharge: 2016-04-18 | Disposition: A | Discharge status: Home / Self Care | Payer: PPO | Source: Ambulatory Visit | Attending: Internal Medicine | Admitting: Internal Medicine

## 2016-04-18 DIAGNOSIS — Z1231 Encounter for screening mammogram for malignant neoplasm of breast: Secondary | ICD-10-CM

## 2016-05-03 ENCOUNTER — Ambulatory Visit: Payer: PPO | Admitting: Internal Medicine

## 2016-05-04 NOTE — Progress Notes (Signed)
Office Visit Note  Patient: Donna Warren             Date of Birth: 02-13-1950           MRN: [id number]             PCP: Lottie Dawson, MD Referring: Burnis Medin, MD Visit Date: 05/08/2016 Occupation: @GUAROCC @    Subjective:  Pain hands   History of Present Illness: Donna Warren is a 66 y.o. female seen in consultation per request of her PCP according to her she's been having pain in her hands off and on for some time. She states initially she noticed a bump on her right thumb and gradually she started noticing her other fingers. she states she doesn't have any joint swelling or joint pain. although she has difficulty gripping objects. she's been diagnosed with disc disease of c-spine in the past. she states her neck has been hurting more recently. none of the other joints are painful. she denies any discoloration of her fingers.  she states she has history of osteoporosis for which she's taking fosamax for one year and then discontinued as she was concerned about the side effects of fosamax. she's been taking calcium and vitamin d. she does walk for regular exercise but  does not do any resistive exercises.  Activities of Daily Living:  Patient reports morning stiffness for 0 minute.   Patient Denies nocturnal pain.  Difficulty dressing/grooming: Denies Difficulty climbing stairs: Denies Difficulty getting out of chair: Denies Difficulty using hands for taps, buttons, cutlery, and/or writing: Denies   Review of Systems  Constitutional: Negative for fatigue, night sweats, weight gain, weight loss and weakness.  HENT: Negative for mouth sores, trouble swallowing, trouble swallowing, mouth dryness and nose dryness.   Eyes: Negative for pain, redness, visual disturbance and dryness.  Respiratory: Negative for cough, shortness of breath and difficulty breathing.   Cardiovascular: Negative for chest pain, palpitations, hypertension, irregular heartbeat and swelling in  legs/feet.  Gastrointestinal: Negative for blood in stool, constipation and diarrhea.  Endocrine: Negative for increased urination.  Genitourinary: Negative for vaginal dryness.  Musculoskeletal: Positive for arthralgias and joint pain. Negative for joint swelling, myalgias, muscle weakness, morning stiffness, muscle tenderness and myalgias.  Skin: Positive for color change. Negative for rash, hair loss, skin tightness, ulcers and sensitivity to sunlight.  Allergic/Immunologic: Negative for susceptible to infections.  Neurological: Negative for dizziness, memory loss and night sweats.  Hematological: Negative for swollen glands.  Psychiatric/Behavioral: Negative for depressed mood and sleep disturbance. The patient is not nervous/anxious.     PMFS History:  Patient Active Problem List   Diagnosis Date Noted  . DJD (degenerative joint disease), cervical 05/08/2016  . Female genital lesion 08/12/2014  . Osteoporosis 07/20/2014  . Elevated LFTs 05/21/2013  . 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 Meniere's disease 10/14/2011  . Skin lesion 08/14/2011  . Right shoulder strain 08/13/2011  . 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 Phlebitis, superficial 08/16/2010  . DECREASED HEARING, RIGHT EAR 11/08/2009  . PROBLEMS WITH HEARING 11/08/2009  . HEADACHE 07/11/2009  . DYSLIPIDEMIA 10/07/2008  . NECK AND BACK PAIN 09/21/2008  . Gilbert syndrome 10/26/2007  . FIBROIDS, UTERUS 05/29/2007  . LIVER HEMANGIOMA 05/29/2007  . FATTY LIVER DISEASE 05/29/2007  . COLONIC POLYPS, HX OF 05/29/2007    Past Medical History:  Diagnosis Date  . Abnormal laboratory test result elevated alpha feto  protein  . Fatty liver   . Femur fracture, right ([place]) 2003  . Fibroids   . Gilbert's syndrome   . Hx of adenomatous colonic polyps   . Meniere disease    Right ear  . Osteoporosis   .  Osteoporosis   . Other hemochromatosis    heterozygosity for h63d gene  . Spontaneous abortion     Family History  Problem Relation Age of Onset  . Colon cancer Father   . Other Father     tinnitus  . Depression Mother   . Anxiety disorder Mother   . Dementia Mother   . Other Mother     high calcium  . Hearing loss Mother     death  . Stroke      silent  . Liver disease Brother     elevated lft. Unknown if he actually has liver disease  . Other Brother     H63D hemochromatosis gene  . Other Daughter     H63d hemochromatosis    Past Surgical History:  Procedure Laterality Date  . BREAST CYST EXCISION Right   . [place]  . COLONOSCOPY  220, 2004, 2007, 08/13/2010   2002: 8 mm polyp 2004: no polyps 2007: no polyps 2012: hemorrhoids  . hosp r/o mi cv right chest and jaw pain  2010  . OVARIAN CYST REMOVAL    . spontaneously abortion on 3rd preg     triploid dna on path   Social History   Social History Narrative        2 daughters   Husband a [employer] director  Working 12 - 42.  Travels    Daily caffeine use   Sleep  Often disrupted.    Alcohol about once a week.   Hof 2 pet dogs     Objective: Vital Signs: BP 116/62 (BP Location: Right Arm)   Pulse 78   Resp 14   Wt 132 lb (59.9 kg)   BMI 22.84 kg/m    Physical Exam  Constitutional: She is oriented to person, place, and time. She appears well-developed and well-nourished.  HENT:  Head: Normocephalic and atraumatic.  Eyes: Conjunctivae and EOM are normal.  Neck: Normal range of motion.  Cardiovascular: Normal rate, regular rhythm, normal heart sounds and intact distal pulses.   Pulmonary/Chest: Effort normal and breath sounds normal.  Abdominal: Soft. Bowel sounds are normal.  Lymphadenopathy:    She has no cervical adenopathy.  Neurological: She is alert and oriented to person, place, and time.  Skin: Skin is warm and dry. Capillary refill takes less than  2 seconds.  Psychiatric: She has a normal mood and affect. Her behavior is normal.  Nursing note and vitals reviewed.    Musculoskeletal Exam: C-spine and thoracic lumbar spine good range of motion. She had no significant kyphosis. Shoulder joints elbow joints wrist joint MCPs PIPs with good range of motion. She is some thickening of DIP joints bilaterally. I some mild hyperemia over the dorsal surface of her PIPs and MCPs probably due to dry skin. Her skin was warm to touch without any Raynaud's phenomenon. No perianal erythema was noted. Bilateral hip joints, knee joints, ankles and MTPs with good range of motion with no synovitis.  CDAI Exam: No CDAI exam completed.    Investigation: No additional findings.   Imaging: Mm Screening Breast Tomo Bilateral  Result Date: 04/18/2016 CLINICAL DATA:  Screening. EXAM: 2D DIGITAL SCREENING BILATERAL  MAMMOGRAM WITH CAD AND ADJUNCT TOMO COMPARISON:  Previous exam(s). ACR Breast Density Category c: The breast tissue is heterogeneously dense, which may obscure small masses. FINDINGS: There are no findings suspicious for malignancy. Images were processed with CAD. IMPRESSION: No mammographic evidence of malignancy. A result letter of this screening mammogram will be mailed directly to the patient. RECOMMENDATION: Screening mammogram in one year. (Code:SM-B-01Y) BI-RADS CATEGORY  1: Negative. Electronically Signed   By: Dorise Bullion III M.D   On: 04/18/2016 15:20   Xr Cervical Spine 2 Or 3 Views  Result Date: 05/08/2016 Multilevel spondylosis with L4-5 listhesis. She is anterior spurring and narrowing between C4-5 C5-6 and C6-7 area Impression these findings are consistent with multilevel spondylosis and mild listhesis  Xr Hand 2 View Left  Result Date: 05/08/2016 No MCP joint space narrowing was noted. She has bilateral PIP and DIP joint space narrowing without any erosive changes. No intercarpal joint space narrowing was noted. No erosive changes  were noted. These findings were consistent with mild osteoarthritis of the hand.  Xr Hand 2 View Right  Result Date: 05/08/2016 No MCP joint space narrowing was noted. She has bilateral PIP and DIP joint space narrowing without any erosive changes. No intercarpal joint space narrowing was noted. No erosive changes were noted. These findings were consistent with mild osteoarthritis of the hand.  Xr Lumbar Spine 2-3 Views  Result Date: 05/08/2016 Dextroscoliosis was noted. L3-4,  L4-L5, L5-S1 narrowing was noted. No SI joint changes were noted. Facet joint arthropathy was noted. Impression: These findings are consistent with this disease of lumbar spine and scoliosis   Speciality Comments: No specialty comments available.    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 I will obtain following labs and x-rays. Plan: XR Hand 2 View Right, XR Hand 2 View Left, x-rays revealed mild osteoarthritic changes Uric acid, Rheumatoid factor, Cyclic citrul peptide antibody, IgG, ANA  DJD (degenerative joint disease), cervical , she's been having increased neck pain.- Plan: XR Cervical Spine 2 or 3 views. 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  Osteoporosis without current pathological fracture, unspecified osteoporosis type: Her DEXA was reviewed. Detailed discussion regarding different treatment options I believe Fosamax will be her best option. Indications side effects contraindications were discussed at length. Need for taking calcium and vitamin D and resistive exercises were also discussed.  Hemochromatosis carrier: Asymptomatic  Elevated LFTs - History of fatty liver  Gilbert syndrome  Meniere's disease, unspecified laterality: She has intermittent symptoms  Myalgia -  Plan: CK    Orders: Orders Placed This Encounter  Procedures  . XR Hand 2 View Right  . XR Hand 2 View Left  . XR Cervical Spine 2 or 3 views  . XR Lumbar Spine 2-3 Views  . Uric acid  . Rheumatoid factor  . Cyclic citrul peptide antibody, IgG  . ANA  . CK   No orders of the defined types were placed in this encounter.   Face-to-face time spent with patient was 45 minutes. 50% of time was spent in counseling and coordination of care.  Follow-Up Instructions: Return for OA,DDD C-spine and lumbar spine.   Bo Merino, MD  Note - This record has been created using Editor, commissioning.  Chart creation errors have been sought, but  may not always  have been located. Such creation errors do not reflect on  the standard of medical care.

## 2016-05-08 ENCOUNTER — Encounter: Payer: Self-pay | Admitting: Rheumatology

## 2016-05-08 ENCOUNTER — Ambulatory Visit (INDEPENDENT_AMBULATORY_CARE_PROVIDER_SITE_OTHER): Payer: PPO

## 2016-05-08 ENCOUNTER — Ambulatory Visit (INDEPENDENT_AMBULATORY_CARE_PROVIDER_SITE_OTHER): Payer: PPO | Admitting: Rheumatology

## 2016-05-08 VITALS — BP 116/62 | HR 78 | Resp 14 | Wt 132.0 lb

## 2016-05-08 DIAGNOSIS — Z148 Genetic carrier of other disease: Secondary | ICD-10-CM | POA: Diagnosis not present

## 2016-05-08 DIAGNOSIS — M791 Myalgia, unspecified site: Secondary | ICD-10-CM

## 2016-05-08 DIAGNOSIS — M5030 Other cervical disc degeneration, unspecified cervical region: Secondary | ICD-10-CM | POA: Diagnosis not present

## 2016-05-08 DIAGNOSIS — M79641 Pain in right hand: Secondary | ICD-10-CM

## 2016-05-08 DIAGNOSIS — M79642 Pain in left hand: Secondary | ICD-10-CM

## 2016-05-08 DIAGNOSIS — M810 Age-related osteoporosis without current pathological fracture: Secondary | ICD-10-CM | POA: Diagnosis not present

## 2016-05-08 DIAGNOSIS — R7989 Other specified abnormal findings of blood chemistry: Secondary | ICD-10-CM

## 2016-05-08 DIAGNOSIS — M47816 Spondylosis without myelopathy or radiculopathy, lumbar region: Secondary | ICD-10-CM | POA: Diagnosis not present

## 2016-05-08 DIAGNOSIS — M47812 Spondylosis without myelopathy or radiculopathy, cervical region: Secondary | ICD-10-CM | POA: Insufficient documentation

## 2016-05-08 DIAGNOSIS — M545 Low back pain, unspecified: Secondary | ICD-10-CM

## 2016-05-08 DIAGNOSIS — R945 Abnormal results of liver function studies: Secondary | ICD-10-CM

## 2016-05-08 DIAGNOSIS — H8109 Meniere's disease, unspecified ear: Secondary | ICD-10-CM

## 2016-05-08 NOTE — Patient Instructions (Signed)
 Hand Exercises Hand exercises can be helpful to almost anyone. These exercises can strengthen the hands, improve flexibility and movement, and increase blood flow to the hands. These results can make work and daily tasks easier. Hand exercises can be especially helpful for people who have joint pain from arthritis or have nerve damage from overuse (carpal tunnel syndrome). These exercises can also help people who have injured a hand. Most of these hand exercises are fairly gentle stretching routines. You can do them often throughout the day. Still, it is a good idea to ask your health care provider which exercises would be best for you. Warming your hands before exercise may help to reduce stiffness. You can do this with gentle massage or by placing your hands in warm water for 15 minutes. Also, make sure you pay attention to your level of hand pain as you begin an exercise routine. Exercises Knuckle Bend  Repeat this exercise 5-10 times with each hand. 1. Stand or sit with your arm, hand, and all five fingers pointed straight up. Make sure your wrist is straight. 2. Gently and slowly bend your fingers down and inward until the tips of your fingers are touching the tops of your palm. 3. Hold this position for a few seconds. 4. Extend your fingers out to their original position, all pointing straight up again. Finger Fan  Repeat this exercise 5-10 times with each hand. 1. Hold your arm and hand out in front of you. Keep your wrist straight. 2. Squeeze your hand into a fist. 3. Hold this position for a few seconds. 4. Fan out, or spread apart, your hand and fingers as much as possible, stretching every joint fully. Tabletop  Repeat this exercise 5-10 times with each hand. 1. Stand or sit with your arm, hand, and all five fingers pointed straight up. Make sure your wrist is straight. 2. Gently and slowly bend your fingers at the knuckles where they meet the hand until your hand is making an  upside-down L shape. Your fingers should form a tabletop. 3. Hold this position for a few seconds. 4. Extend your fingers out to their original position, all pointing straight up again. Making Os  Repeat this exercise 5-10 times with each hand. 1. Stand or sit with your arm, hand, and all five fingers pointed straight up. Make sure your wrist is straight. 2. Make an O shape by touching your pointer finger to your thumb. Hold for a few seconds. Then open your hand wide. 3. Repeat this motion with each finger on your hand. Table Spread  Repeat this exercise 5-10 times with each hand. 1. Place your hand on a table with your palm facing down. Make sure your wrist is straight. 2. Spread your fingers out as much as possible. Hold this position for a few seconds. 3. Slide your fingers back together again. Hold for a few seconds. Ball Grip   Repeat this exercise 10-15 times with each hand. 1. Hold a tennis ball or another soft ball in your hand. 2. While slowly increasing pressure, squeeze the ball as hard as possible. 3. Squeeze as hard as you can for 3-5 seconds. 4. Relax and repeat. Wrist Curls  Repeat this exercise 10-15 times with each hand. 1. Sit in a chair that has armrests. 2. Hold a light weight in your hand, such as a dumbbell that weighs 1-3 pounds (0.5-1.4 kg). Ask your health care provider what weight would be best for you. 3. Rest your hand   over the end of the chair arm with your palm facing up. 4. Gently pivot your wrist up and down while holding the weight. Do not twist your wrist from side to side. Contact a health care provider if:  Your hand pain or discomfort gets much worse when you do an exercise.  Your hand pain or discomfort does not improve within 2 hours after you exercise. If you have any of these problems, stop doing these exercises right away. Do not do them again unless your health care provider says that you can. Get help right away if:  You develop  sudden, severe hand pain. If this happens, stop doing these exercises right away. Do not do them again unless your health care provider says that you can. This information is not intended to replace advice given to you by your health care provider. Make sure you discuss any questions you have with your health care provider. Document Released: 12/19/2014 Document Revised: 06/15/2015 Document Reviewed: 07/18/2014 Elsevier Interactive Patient Education  2017 [employer] Cervical Strain and Sprain Rehab Ask your health care provider which exercises are safe for you. Do exercises exactly as told by your health care provider and adjust them as directed. It is normal to feel mild stretching, pulling, tightness, or discomfort as you do these exercises, but you should stop right away if you feel sudden pain or your pain gets worse.Do not begin these exercises until told by your health care provider. Stretching and range of motion exercises These exercises warm up your muscles and joints and improve the movement and flexibility of your neck. These exercises also help to relieve pain, numbness, and tingling. Exercise A: Cervical side bend   5. Using good posture, sit on a stable chair or stand up. 6. Without moving your shoulders, slowly tilt your left / right ear to your shoulder until you feel a stretch in your neck muscles. You should be looking straight ahead. 7. Hold for __________ seconds. 8. Repeat with the other side of your neck. Repeat __________ times. Complete this exercise __________ times a day. Exercise B: Cervical rotation   1. Using good posture, sit on a stable chair or stand up. 2. Slowly turn your head to the side as if you are looking over your left / right shoulder.  Keep your eyes level with the ground.  Stop when you feel a stretch along the side and the back of your neck. 3. Hold for __________ seconds. 4. Repeat this by turning to your other side. Repeat __________ times.  Complete this exercise __________ times a day. Exercise C: Thoracic extension and pectoral stretch  5. Roll a towel or a small blanket so it is about 4 inches (10 cm) in diameter. 6. Lie down on your back on a firm surface. 7. Put the towel lengthwise, under your spine in the middle of your back. It should not be not under your shoulder blades. The towel should line up with your spine from your middle back to your lower back. 8. Put your hands behind your head and let your elbows fall out to your sides. 9. Hold for __________ seconds. Repeat __________ times. Complete this exercise __________ times a day. Strengthening exercises These exercises build strength and endurance in your neck. Endurance is the ability to use your muscles for a long time, even after your muscles get tired. Exercise D: Upper cervical flexion, isometric  4. Lie on your back with a thin pillow behind your head and a small  rolled-up towel under your neck. 5. Gently tuck your chin toward your chest and nod your head down to look toward your feet. Do not lift your head off the pillow. 6. Hold for __________ seconds. 7. Release the tension slowly. Relax your neck muscles completely before you repeat this exercise. Repeat __________ times. Complete this exercise __________ times a day. Exercise E: Cervical extension, isometric   4. Stand about 6 inches (15 cm) away from a wall, with your back facing the wall. 5. Place a soft object, about 6-8 inches (15-20 cm) in diameter, between the back of your head and the wall. A soft object could be a small pillow, a ball, or a folded towel. 6. Gently tilt your head back and press into the soft object. Keep your jaw and forehead relaxed. 7. Hold for __________ seconds. 8. Release the tension slowly. Relax your neck muscles completely before you repeat this exercise. Repeat __________ times. Complete this exercise __________ times a day. Posture and body mechanics   Body mechanics  refers to the movements and positions of your body while you do your daily activities. Posture is part of body mechanics. Good posture and healthy body mechanics can help to relieve stress in your body's tissues and joints. Good posture means that your spine is in its natural S-curve position (your spine is neutral), your shoulders are pulled back slightly, and your head is not tipped forward. The following are general guidelines for applying improved posture and body mechanics to your everyday activities. Standing   When standing, keep your spine neutral and keep your feet about hip-width apart. Keep a slight bend in your knees. Your ears, shoulders, and hips should line up.  When you do a task in which you stand in one place for a long time, place one foot up on a stable object that is 2-4 inches (5-10 cm) high, such as a footstool. This helps keep your spine neutral. Sitting    When sitting, keep your spine neutral and your keep feet flat on the floor. Use a footrest, if necessary, and keep your thighs parallel to the floor. Avoid rounding your shoulders, and avoid tilting your head forward.  When working at a desk or a computer, keep your desk at a height where your hands are slightly lower than your elbows. Slide your chair under your desk so you are close enough to maintain good posture.  When working at a computer, place your monitor at a height where you are looking straight ahead and you do not have to tilt your head forward or downward to look at the screen. Resting  When lying down and resting, avoid positions that are most painful for you. Try to support your neck in a neutral position. You can use a contour pillow or a small rolled-up towel. Your pillow should support your neck but not push on it. This information is not intended to replace advice given to you by your health care provider. Make sure you discuss any questions you have with your health care provider. Document Released:  01/07/2005 Document Revised: 09/14/2015 Document Reviewed: 12/14/2014 Elsevier Interactive Patient Education  2017 [employer] [facility] [employer] Natural anti-inflammatories  You can purchase these at [employer], [employer] or online.  . Turmeric (capsules)  . Ginger (ginger root or capsules)  . Omega 3 (Fish, flax seeds, chia seeds, walnuts, almonds)  . Tart cherry (dried or extract)   Patient should be under the care of a physician while taking these  supplements. This may not be reproduced without the permission of Dr. Bo Merino.

## 2016-05-09 LAB — RHEUMATOID FACTOR: Rhuematoid fact SerPl-aCnc: <14 IU/mL (ref <14)

## 2016-05-09 LAB — CYCLIC CITRUL PEPTIDE ANTIBODY, IGG: Cyclic Citrullin Peptide Ab: <16 Units

## 2016-05-09 LAB — CK: CK TOTAL: 75 U/L (ref 7–177)

## 2016-05-09 LAB — URIC ACID: Uric Acid, Serum: 5.1 mg/dL (ref 2.5–7.0)

## 2016-05-10 ENCOUNTER — Ambulatory Visit (INDEPENDENT_AMBULATORY_CARE_PROVIDER_SITE_OTHER): Payer: PPO | Admitting: Internal Medicine

## 2016-05-10 ENCOUNTER — Encounter: Payer: Self-pay | Admitting: Internal Medicine

## 2016-05-10 VITALS — BP 120/64 | HR 66 | Ht 63.75 in | Wt 130.2 lb

## 2016-05-10 DIAGNOSIS — M8000XD Age-related osteoporosis with current pathological fracture, unspecified site, subsequent encounter for fracture with routine healing: Secondary | ICD-10-CM | POA: Diagnosis not present

## 2016-05-10 LAB — BASIC METABOLIC PANEL WITH GFR
BUN: 21 mg/dL (ref 7–25)
CHLORIDE: 98 mmol/L (ref 98–110)
CO2: 30 mmol/L (ref 20–31)
Calcium: 10.6 mg/dL — ABNORMAL HIGH (ref 8.6–10.4)
Creat: 1.05 mg/dL — ABNORMAL HIGH (ref 0.50–0.99)
GFR, Est African American: 64 mL/min (ref >=60)
GFR, Est Non African American: 56 mL/min — ABNORMAL LOW (ref >=60)
Glucose, Bld: 72 mg/dL (ref 65–99)
POTASSIUM: 4.6 mmol/L (ref 3.5–5.3)
SODIUM: 141 mmol/L (ref 135–146)

## 2016-05-10 LAB — ANTI-NUCLEAR AB-TITER (ANA TITER): ANA Titer 1: 1:160 {titer} — ABNORMAL HIGH

## 2016-05-10 LAB — VITAMIN D 25 HYDROXY (VIT D DEFICIENCY, FRACTURES): VITD: 34.94 ng/mL (ref 30.00–100.00)

## 2016-05-10 LAB — ANA: Anti Nuclear Antibody(ANA): POSITIVE — AB

## 2016-05-10 NOTE — Progress Notes (Signed)
Patient ID: Donna Warren, female   DOB: 1950-11-01, 66 y.o.   MRN: [id number]    HPI  Donna Warren [facility]) is a 66 y.o.-year-old female, referred by her PCP, Dr. Regis Bill, for management of osteoporosis.  Pt was dx with Osteopenia at the beginning of 66s and is not sure when this became osteoporosis.  I reviewed pt's DEXA scans: Date L1-L4 T score FN T score 33% distal Radius  07/29/2014  -2.5 (-3.4%*)  R: -3.1 L: -3.0 (93.1%)    08/09/2011       She had - 1970s: foot fx - hiking down a mountain - in her 80s - 2002 or 2003: R femur fx  - after running in clogs and tripping - no surgery needed   No dizziness/orthostasis/poor vision. She does have Meniere's ds.  - on HCTZ. No attacks since 2014.On low salt.  Previous OP treatments: - Fosamax for 2 years in 1999-2001, however, she stopped for fear of possible side effects  No h/o vitamin D deficiency. Reviewed available vit D levels: Lab Results  Component Value Date   VD25OH 52 02/23/2013   VD25OH 38 10/14/2011   VD25OH 36 03/12/2011   Pt is on calcium 600 mg and vitamin D - ?Marland Kitchen She also eats dairy and green, leafy, vegetables.   No weight bearing exercises. Walks 3-4x a week 2 mi a day. She goes to gym. + yoga.  She does not take high vitamin A doses.  Menopause was at 5s.  Pt does have an extensive FH of osteoporosis: mother, aunts.  No h/o hyper/hypocalcemia or hyperparathyroidism. No h/o kidney stones. Lab Results  Component Value Date   PTH 42.2 10/14/2011   PTH 37.9 03/12/2011   PTH 25.7 08/03/2007   CALCIUM 10.0 12/06/2015   CALCIUM 9.7 10/20/2015   CALCIUM 10.1 07/12/2014   CALCIUM 9.6 02/16/2013   CALCIUM 9.9 10/14/2011   CALCIUM 10.1 10/14/2011   CALCIUM 10.7 (H) 03/12/2011   CALCIUM 9.9 02/28/2011   CALCIUM 9.9 07/05/2009   CALCIUM 9.4 09/16/2008   No h/o thyrotoxicosis. Reviewed TSH recent levels:  Lab Results  Component Value Date   TSH 1.38 12/06/2015   TSH 1.48 07/12/2014   TSH 1.30 02/16/2013    TSH 0.97 10/14/2011   TSH 1.00 02/28/2011   No h/o CKD. Last BUN/Cr: Lab Results  Component Value Date   BUN 23 12/06/2015   CREATININE 0.97 12/06/2015   She has elevated LFTs - chronically - she is followed by GI. She also has Gilbert sd., OA.  ROS: Constitutional: + slight weight gain, no fatigue, no subjective hyperthermia/hypothermia Eyes: no blurry vision, no xerophthalmia ENT: no sore throat, no nodules palpated in throat, no dysphagia/odynophagia, no hoarseness, + tinnitus Cardiovascular: no CP/SOB/palpitations/leg swelling Respiratory: no cough/SOB Gastrointestinal: no N/V/D/C Musculoskeletal: no muscle/+ joint aches Skin: no rashes Neurological: no tremors/numbness/tingling/dizziness Psychiatric: no depression/anxiety  Past Medical History:  Diagnosis Date  . 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 Fatty liver   . Femur fracture, right ([place]) 2003  . Fibroids   . Gilbert's syndrome   . Hx of adenomatous colonic polyps   . Meniere disease    Right ear  . Osteoporosis   . Osteoporosis   . Other hemochromatosis    heterozygosity for h63d gene  . Spontaneous abortion    Past Surgical History:  Procedure Laterality Date  . BREAST CYST EXCISION Right   . [place]  . COLONOSCOPY  220, 2004, 2007, 08/13/2010  2002: 8 mm polyp 2004: no polyps 2007: no polyps 2012: hemorrhoids  . hosp r/o mi cv right chest and jaw pain  2010  . OVARIAN CYST REMOVAL    . spontaneously abortion on 3rd preg     triploid dna on path   Social History  - Education officer, museum  Social History Main Topics  . Smoking status: Never Smoker  . Smokeless tobacco: Never Used  . Alcohol use 0.6 - 1.2 oz/week    1 - 2 Glasses of wine per week  . Drug use: No   Social History Narrative        2 daughters   Husband a Secondary school teacher  Working 28 - 31.  Travels    Daily caffeine use   Sleep  Often disrupted.    Alcohol  about once a week.   Jet 2 pet dogs   Current Outpatient Prescriptions on File Prior to Visit  Medication Sig Dispense Refill  . Calcium Carb-Cholecalciferol (CALCIUM 600/VITAMIN D3) 600-800 MG-UNIT TABS Take 1 tablet by mouth daily.    . chlorthalidone (HYGROTON) 25 MG tablet Take 25 mg by mouth daily.     No current facility-administered medications on file prior to visit.    Allergies  Allergen Reactions  . Penicillins     rash   Family History  Problem Relation Age of Onset  . Colon cancer Father   . Other Father     tinnitus  . Depression Mother   . Anxiety disorder Mother   . Dementia Mother   . Other Mother     high calcium  . Hearing loss Mother     death  . Stroke      silent  . Liver disease Brother     elevated lft. Unknown if he actually has liver disease  . Other Brother     H63D hemochromatosis gene  . Other Daughter     H63d hemochromatosis    PE: BP 120/64   Pulse 66   Ht 5' 3.75" (1.619 m)   Wt 130 lb 3.2 oz (59.1 kg)   BMI 22.52 kg/m  Wt Readings from Last 3 Encounters:  05/08/16 132 lb (59.9 kg)  03/12/16 130 lb 6.4 oz (59.1 kg)  12/06/15 128 lb 6.4 oz (58.2 kg)   Constitutional: normal weight, in NAD. No kyphosis. Eyes: PERRLA, EOMI, no exophthalmos ENT: moist mucous membranes, no thyromegaly, no cervical lymphadenopathy Cardiovascular: RRR, No MRG Respiratory: CTA B Gastrointestinal: abdomen soft, NT, ND, BS+ Musculoskeletal: no deformities, strength intact in all 4 Skin: moist, warm, no rashes Neurological: no tremor with outstretched hands, DTR normal in all 4  Assessment: 1. Osteoporosis  Plan: 1. Osteoporosis - likely postmenopausal, she has FH of OP - Discussed about increased risk of fracture, depending on the T score, greatly increased when the T score is lower than -2.5, but it is actually a continuum and -2.5 should not be regarded as an absolute threshold. We reviewed her latest DEXA scan report and images together, and I  explained that based on the T scores, she has an increased risk for fractures.  - we reviewed her dietary and supplemental calcium and vitamin D intake. She gets ~1000-1200 mg of calcium daily and I will check vit D today to see if she needs supplementation - given her specific instructions about food sources for Calcium and Vitamin D - see pt instructions  - discussed fall precautions   - given  handout from [place] Re: weight bearing exercises - advised to do this every day or at least 5/7 days. I also suggested to start going to the [place] center. - we discussed about maintaining a good amount of protein in her diet. The recommended daily protein intake is ~0.8 g per kilogram per day (~50g for her). I advised her to try to aim for this amount, since a diet low in proteins can exacerbate osteoporosis. Also, avoid smoking or >2 drinks of alcohol a day. - We discussed about the different medication classes, benefits and side effects (including atypical fractures and ONJ - has dental workup planned >> next week: crown).  - I explained that, since she has quite low T scores, I would not use oral bisphosphonates, so my first choice would be sq denosumab (Prolia) for 3-6 years, then zoledronic acid (iv Reclast) for 1-2 years. I would use Teriparatide as a last resort. Pt was given reading information about Prolia, and I explained the mechanism of action and expected benefits.  - she agrees with this plan - will check a BMP (need a recent level for the Prolia PA) - if labs normal, will arrange for a Prolia inj - will check a new DEXA scan before and in 2 years after starting Prolia -  I explained that the first indication that the treatment is working is her not having anymore fractures. DEXA scan changes are secondary: unchanged or slightly higher T-scores are desirable. Her most recent DEXA scan was in 07/2014. Ideally, we'll get another one before starting Prolia, however, she  will call her insurance to see if we can do this now, rather than waiting until July. She will let me know.. - will see pt back in a year  Orders Placed This Encounter  Procedures  . BASIC METABOLIC PANEL WITH GFR  . VITAMIN D 25 Hydroxy (Vit-D Deficiency, Fractures)   - time spent with the patient: 1 hour, of which >50% was spent in obtaining information about her symptoms, reviewing her previous labs, evaluations, and treatments, counseling her about her condition (please see the discussed topics above), and developing a plan to further investigate it and treat it; she had a number of questions which I addressed.  Component     Latest Ref Rng & Units 05/10/2016  Sodium     135 - 146 mmol/L 141  Potassium     3.5 - 5.3 mmol/L 4.6  Chloride     98 - 110 mmol/L 98  CO2     20 - 31 mmol/L 30  Glucose     65 - 99 mg/dL 72  BUN     7 - 25 mg/dL 21  Creatinine     0.50 - 0.99 mg/dL 1.05 (H)  Calcium     8.6 - 10.4 mg/dL 10.6 (H)  GFR, Est African American     >=60 mL/min 64  GFR, Est Non African American     >=60 mL/min 56 (L)  VITD     30.00 - 100.00 ng/mL 34.94   Calcium slightly high, as is her creatinine. I suspect a mild degree of dehydration, since all her previous labs have been normal. We'll go ahead with Prolia PA, as discussed.  Received message from patient: I checked with my insurance--[employer]-- and if medically necessary, I can have a dexa scan before 2 years. So it sounds ok but They suggested your office call the provider line to make sure you don't need a  prior auth for it. My card says that number is [phone]. I will wait back to hear if it's all ok and that you have put the order in. Thank you--it was very nice to meet you today.  Donna Warren.   Philemon Kingdom, MD PhD [facility] Endocrinology

## 2016-05-10 NOTE — Patient Instructions (Signed)
Please stop at the lab.  Please continue calcium-vitamin D for now.  Check with your insurance about DEXA scan coverage <2 years.  Finish dental work.  Please return in 1 year.  How Can I Prevent Falls? Men and women with osteoporosis need to take care not to fall down. Falls can break bones. Some reasons people fall are: Poor vision  Poor balance  Certain diseases that affect how you walk  Some types of medicine, such as sleeping pills.  Some tips to help prevent falls outdoors are: Use a cane or walker  Wear rubber-soled shoes so you don't slip  Walk on grass when sidewalks are slippery  In winter, put salt or kitty litter on icy sidewalks.  Some ways to help prevent falls indoors are: Keep rooms free of clutter, especially on floors  Use plastic or carpet runners on slippery floors  Wear low-heeled shoes that provide good support  Do not walk in socks, stockings, or slippers  Be sure carpets and area rugs have skid-proof backs or are tacked to the floor  Be sure stairs are well lit and have rails on both sides  Put grab bars on bathroom walls near tub, shower, and toilet  Use a rubber bath mat in the shower or tub  Keep a flashlight next to your bed  Use a sturdy step stool with a handrail and wide steps  Add more lights in rooms (and night lights) Buy a cordless phone to keep with you so that you don't have to rush to the phone       when it rings and so that you can call for help if you fall.   (adapted from http://www.niams.[employer]ess[employer].at)  Dietary sources of calcium and vitamin D:  Calcium content (mg) - http://www.niams.https://www.gonzalez.org/  Fortified oatmeal, 1 packet 350  Sardines, canned in oil, with edible bones, 3 oz. 324  Cheddar cheese, 1 oz. shredded 306  Milk, nonfat, 1 cup 302  Milkshake, 1 cup 300  Yogurt, plain, low-fat, 1 cup 300  Soybeans, cooked, 1 cup 261  Tofu, firm, with calcium,   cup 204  Orange juice, fortified with calcium, 6 oz. 200-260 (varies)  Salmon, canned, with edible bones, 3 oz. 181  Pudding, instant, made with 2% milk,  cup 153  Baked beans, 1 cup 142  Cottage cheese, 1% milk fat, 1 cup 138  Spaghetti, lasagna, 1 cup 125  Frozen yogurt, vanilla, soft-serve,  cup 103  Ready-to-eat cereal, fortified with calcium, 1 cup 100-1,000 (varies)  Cheese pizza, 1 slice 100  Fortified waffles, 2 100  Turnip greens, boiled,  cup 99  Broccoli, raw, 1 cup 90  Ice cream, vanilla,  cup 85  Soy or rice milk, fortified with calcium, 1 cup 80-500 (varies)   Vitamin D content (International Units, IU) - https://www.ars.usda.gov Cod liver oil, 1 tablespoon 1,360  Swordfish, cooked, 3 oz 566  Salmon (sockeye), cooked, 3 oz 447  Tuna fish, canned in water, drained, 3 oz 154  Orange juice fortified with vitamin D, 1 cup (check product labels, as amount of added vitamin D varies) 137  Milk, nonfat, reduced fat, and whole, vitamin D-fortified, 1 cup 115-124  Yogurt, fortified with 20% of the daily value for vitamin D, 6 oz 80  Margarine, fortified, 1 tablespoon 60  Sardines, canned in oil, drained, 2 sardines 46  Liver, beef, cooked, 3 oz 42  Egg, 1 large (vitamin D is found in yolk) 41  Ready-to-eat cereal, fortified with 10% of the  daily value for vitamin D, 0.75-1 cup  40  Cheese, Swiss, 1 oz 6   Exercise for Strong Bones (from [employer]) There are two types of exercises that are important for building and maintaining bone density:  weight-bearing and muscle-strengthening exercises. Weight-bearing Exercises These exercises include activities that make you move against gravity while staying upright. Weight-bearing exercises can be high-impact or low-impact. High-impact weight-bearing exercises help build bones and keep them strong. If you have broken a bone due to osteoporosis or are at risk of breaking a bone, you may need to avoid  high-impact exercises. If you're not sure, you should check with your healthcare provider. Examples of high-impact weight-bearing exercises are: . Dancing . Doing high-impact aerobics . Hiking . Jogging/running . Jumping Rope . Stair climbing . Tennis Low-impact weight-bearing exercises can also help keep bones strong and are a safe alternative if you cannot do high-impact exercises. Examples of low-impact weight-bearing exercises are: . Using elliptical training machines . Doing low-impact aerobics . Using stair-step machines . Fast walking on a treadmill or outside Muscle-Strengthening Exercises These exercises include activities where you move your body, a weight or some other resistance against gravity. They are also known as resistance exercises and include: . Lifting weights . Using elastic exercise bands . Using weight machines . Lifting your own body weight . Functional movements, such as standing and rising up on your toes Yoga and Pilates can also improve strength, balance and flexibility. However, certain positions may not be safe for people with osteoporosis or those at increased risk of broken bones. For example, exercises that have you bend forward may increase the chance of breaking a bone in the spine. A physical therapist should be able to help you learn which exercises are safe and appropriate for you. Non-Impact Exercises Non-impact exercises can help you to improve balance, posture and how well you move in everyday activities. These exercises can also help to increase muscle strength and decrease the risk of falls and broken bones. Some of these exercises include: . Balance exercises that strengthen your legs and test your balance, such as Tai Chi, can decrease your risk of falls. . Posture exercises that improve your posture and reduce rounded or "sloping" shoulders can help you decrease the chance of breaking a bone, especially in the spine. . Functional exercises that  improve how well you move can help you with everyday activities and decrease your chance of falling and breaking a bone. For example, if you have trouble getting up from a chair or climbing stairs, you should do these activities as exercises. A physical therapist can teach you balance, posture and functional exercises. Starting a New Exercise Program If you haven't exercised regularly for a while, check with your healthcare provider before beginning a new exercise program--particularly if you have health problems such as heart disease, diabetes or high blood pressure. If you're at high risk of breaking a bone, you should work with a physical therapist to develop a safe exercise program. Once you have your healthcare provider's approval, start slowly. If you've already broken bones in the spine because of osteoporosis, be very careful to avoid activities that require reaching down, bending forward, rapid twisting motions, heavy lifting and those that increase your chance of a fall. As you get started, your muscles may feel sore for a day or two after you exercise. If soreness lasts longer, you may be working too hard and need to ease up. Exercises should be done in a  pain-free range of motion. How Much Exercise Do You Need? Weight-bearing exercises 30 minutes on most days of the week. Do a 30-minutesession or multiple sessions spread out throughout the day. The benefits to your bones are the same.   Muscle-strengthening exercises Two to three days per week. If you don't have much time for strengthening/resistance training, do small amounts at a time. You can do just one body part each day. For example do arms one day, legs the next and trunk the next. You can also spread these exercises out during your normal day.  Balance, posture and functional exercises Every day or as often as needed. You may want to focus on one area more than the others. If you have fallen or lose your balance, spend time doing balance  exercises. If you are getting rounded shoulders, work more on posture exercises. If you have trouble climbing stairs or getting up from the couch, do more functional exercises. You can also perform these exercises at one time or spread them during your day. Work with a phyiscal therapist to learn the right exercises for you.    Denosumab: Patient drug information (Up-to-date) Copyright [phone] [employer] All rights reserved.  Brand Names: [place]  ProliaRivka Barbara What is this drug used for?  .It is used to treat soft, brittle bones (osteoporosis).  .It is used for bone growth.  .It is used when treating some cancers.  .It may be given to you for other reasons. Talk with the doctor. What do I need to tell my doctor BEFORE I take this drug?  All products:  .If you have an allergy to denosumab or any other part of this drug.  .If you are allergic to any drugs like this one, any other drugs, foods, or other substances. Tell your doctor about the allergy and what signs you had, like rash; hives; itching; shortness of breath; wheezing; cough; swelling of face, lips, tongue, or throat; or any other signs.  .If you have low calcium levels.  ProliaT:  .If you are pregnant or may be pregnant. Do not take this drug if you are pregnant.  This is not a list of all drugs or health problems that interact with this drug.  Tell your doctor and pharmacist about all of your drugs (prescription or OTC, natural products, vitamins) and health problems. You must check to make sure that it is safe for you to take this drug with all of your drugs and health problems. Do not start, stop, or change the dose of any drug without checking with your doctor. What are some things I need to know or do while I take this drug?  All products:  .Tell dentists, surgeons, and other doctors that you use this drug.  .This drug may raise the chance of a broken leg. Talk with your doctor.  .Have your blood work checked. Talk with  your doctor.  .Have a bone density test. Talk with your doctor.  .Take calcium and vitamin D as you were told by your doctor.  .Have a dental exam before starting this drug.  .Take good care of your teeth. See a dentist often.  .If you smoke, talk with your doctor.  .Do not give to a child. Talk with your doctor.  .Tell your doctor if you are breast-feeding. You will need to talk about any risks to your baby.  Xgeva:  .This drug may cause harm to the unborn baby if you take it while you are pregnant. If  you get pregnant while taking this drug, call your doctor right away.  ProliaT:  .Very bad infections have been reported with use of this drug. If you have any infection, are taking antibiotics now or in the recent past, or have many infections, talk with your doctor.  .You may have more chance of getting an infection. Wash hands often. Stay away from people with infections, colds, or flu.  .Use birth control that you can trust to prevent pregnancy while taking this drug.  .If you are a man and your sex partner is pregnant or gets pregnant at any time while you are being treated, talk with your doctor. What are some side effects that I need to call my doctor about right away?  WARNING/CAUTION: Even though it may be rare, some people may have very bad and sometimes deadly side effects when taking a drug. Tell your doctor or get medical help right away if you have any of the following signs or symptoms that may be related to a very bad side effect:  All products:  .Signs of an allergic reaction, like rash; hives; itching; red, swollen, blistered, or peeling skin with or without fever; wheezing; tightness in the chest or throat; trouble breathing or talking; unusual hoarseness; or swelling of the mouth, face, lips, tongue, or throat.  .Signs of low calcium levels like muscle cramps or spasms, numbness and tingling, or seizures.  .Mouth sores.  .Any new or strange groin, hip, or thigh pain.  .This  drug may cause jawbone problems. The chance may be higher the longer you take this drug. The chance may be higher if you have cancer, dental problems, dentures that do not fit well, anemia, blood clotting problems, or an infection. The chance may also be higher if you are having dental work or if you are getting chemo, some steroid drugs, or radiation. Call your doctor right away if you have jaw swelling or pain.  Xgeva:  .Not hungry.  .Muscle pain or weakness.  .Seizures.  .Shortness of breath.  ProliaT:  .Signs of infection. These include a fever of 100.42F (38C) or higher, chills, very bad sore throat, ear or sinus pain, cough, more sputum or change in color of sputum, pain with passing urine, mouth sores, wound that will not heal, or anal itching or pain.  .Signs of a pancreas problem (pancreatitis) like very bad stomach pain, very bad back pain, or very bad upset stomach or throwing up.  .Chest pain.  .A heartbeat that does not feel normal.  .Very bad skin irritation.  .Feeling very tired or weak.  .Bladder pain or pain when passing urine or change in how much urine is passed.  .Passing urine often.  .Swelling in the arms or legs. What are some other side effects of this drug?  All drugs may cause side effects. However, many people have no side effects or only have minor side effects. Call your doctor or get medical help if any of these side effects or any other side effects bother you or do not go away:  Xgeva:  .Feeling tired or weak.  Marland KitchenHeadache.  Marland KitchenUpset stomach or throwing up.  .Loose stools (diarrhea).  .Cough.  ProliaT:  .Back pain.  .Muscle or joint pain.  .Sore throat.  .Runny nose.  .Pain in arms or legs.  These are not all of the side effects that may occur. If you have questions about side effects, call your doctor. Call your doctor for medical advice about side effects.  You  may report side effects to your national health agency. How is this drug best taken?  Use  this drug as ordered by your doctor. Read and follow the dosing on the label closely.  .It is given as a shot into the fatty part of the skin. What do I do if I miss a dose?  .Call the doctor to find out what to do. How do I store and/or throw out this drug?  Marland KitchenThis drug will be given to you in a hospital or doctor's office. You will not store it at home.  Marland KitchenKeep all drugs out of the reach of children and pets.  .Check with your pharmacist about how to throw out unused drugs.  General drug facts  .If your symptoms or health problems do not get better or if they become worse, call your doctor.  .Do not share your drugs with others and do not take anyone else's drugs.  Marland KitchenKeep a list of all your drugs (prescription, natural products, vitamins, OTC) with you. Give this list to your doctor.  .Talk with the doctor before starting any new drug, including prescription or OTC, natural products, or vitamins.  .Some drugs may have another patient information leaflet. If you have any questions about this drug, please talk with your doctor, pharmacist, or other health care provider.  .If you think there has been an overdose, call your poison control center or get medical care right away. Be ready to tell or show what was taken, how much, and when it happened.

## 2016-05-13 ENCOUNTER — Encounter: Payer: Self-pay | Admitting: Internal Medicine

## 2016-05-13 NOTE — Progress Notes (Signed)
Labs are stable.

## 2016-05-28 ENCOUNTER — Other Ambulatory Visit: Payer: Self-pay | Admitting: Internal Medicine

## 2016-05-28 ENCOUNTER — Encounter: Payer: Self-pay | Admitting: Internal Medicine

## 2016-05-28 DIAGNOSIS — M810 Age-related osteoporosis without current pathological fracture: Secondary | ICD-10-CM

## 2016-06-05 ENCOUNTER — Encounter: Payer: Self-pay | Admitting: Internal Medicine

## 2016-06-05 DIAGNOSIS — M19041 Primary osteoarthritis, right hand: Secondary | ICD-10-CM | POA: Insufficient documentation

## 2016-06-05 DIAGNOSIS — M19042 Primary osteoarthritis, left hand: Principal | ICD-10-CM

## 2016-06-05 NOTE — Progress Notes (Signed)
Office Visit Note  Patient: Donna Warren             Date of Birth: Jan 19, 1951           MRN: [id number]             PCP: Burnis Medin, MD Referring: Burnis Medin, MD Visit Date: 06/12/2016 Occupation: @GUAROCC @    Subjective:  Osteoporosis, joint pain   History of Present Illness: [name] is a 66 y.o. female with history of osteoarthritis and disc disease. She continues to have some stiffness and discomfort in her C-spine and her hands. She has lower back pain only with prolonged standing. She will be getting a bone density in July 2018. In her plan is to start him probably after that.  Activities of Daily Living:  Patient reports morning stiffness for 0 minute.   Patient Denies nocturnal pain.  Difficulty dressing/grooming: Denies Difficulty climbing stairs: Denies Difficulty getting out of chair: Denies Difficulty using hands for taps, buttons, cutlery, and/or writing: Denies   Review of Systems  Constitutional: Negative for fatigue, night sweats, weight gain, weight loss and weakness.  HENT: Negative for mouth sores, trouble swallowing, trouble swallowing, mouth dryness and nose dryness.   Eyes: Negative for pain, redness, visual disturbance and dryness.  Respiratory: Negative for cough, shortness of breath and difficulty breathing.   Cardiovascular: Negative for chest pain, palpitations, hypertension, irregular heartbeat and swelling in legs/feet.  Gastrointestinal: Negative for blood in stool, constipation and diarrhea.  Endocrine: Negative for increased urination.  Genitourinary: Negative for vaginal dryness.  Musculoskeletal: Positive for arthralgias and joint pain. Negative for joint swelling, myalgias, muscle weakness, morning stiffness, muscle tenderness and myalgias.  Skin: Negative for color change, rash, hair loss, skin tightness, ulcers and sensitivity to sunlight.  Allergic/Immunologic: Negative for susceptible to infections.  Neurological: Negative  for dizziness, memory loss and night sweats.  Hematological: Negative for swollen glands.  Psychiatric/Behavioral: Negative for depressed mood and sleep disturbance. The patient is not nervous/anxious.     PMFS History:  Patient Active Problem List   Diagnosis Date Noted  . 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 Primary osteoarthritis of both hands 06/05/2016  . DJD (degenerative joint disease), cervical 05/08/2016  . Female genital lesion 08/12/2014  . Osteoporosis 07/20/2014  . Elevated LFTs 05/21/2013  . 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 Meniere's disease 10/14/2011  . Skin lesion 08/14/2011  . Right shoulder strain 08/13/2011  . 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 Phlebitis, superficial 08/16/2010  . DECREASED HEARING, RIGHT EAR 11/08/2009  . PROBLEMS WITH HEARING 11/08/2009  . HEADACHE 07/11/2009  . DYSLIPIDEMIA 10/07/2008  . NECK AND BACK PAIN 09/21/2008  . Gilbert syndrome 10/26/2007  . FIBROIDS, UTERUS 05/29/2007  . LIVER HEMANGIOMA 05/29/2007  . Fatty liver 05/29/2007  . COLONIC POLYPS, HX OF 05/29/2007    Past Medical History:  Diagnosis Date  . 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 Fatty liver   . Femur fracture, right ([place]) 2003  . Fibroids   . Gilbert's syndrome   . Hx of adenomatous colonic polyps   . Meniere disease    Right ear  . Osteoporosis   . Osteoporosis   . Other hemochromatosis    heterozygosity for h63d gene  . Spontaneous abortion     Family History  Problem Relation Age of Onset  . Colon cancer Father   .  Other Father        tinnitus  . Depression Mother   . Anxiety disorder Mother   . Dementia Mother   . Other Mother        high calcium  . Hearing loss Mother        death  . Stroke Unknown        silent  . Liver disease Brother        elevated lft. Unknown if he actually has  liver disease  . Other Brother        H63D hemochromatosis gene  . Other Daughter        H63d hemochromatosis    Past Surgical History:  Procedure Laterality Date  . BREAST CYST EXCISION Right   . [place]  . COLONOSCOPY  220, 2004, 2007, 08/13/2010   2002: 8 mm polyp 2004: no polyps 2007: no polyps 2012: hemorrhoids  . hosp r/o mi cv right chest and jaw pain  2010  . OVARIAN CYST REMOVAL    . spontaneously abortion on 3rd preg     triploid dna on path   Social History   Social History Narrative        2 daughters   Husband a [employer] director  Working 45 - 10.  Travels    Daily caffeine use   Sleep  Often disrupted.    Alcohol about once a week.   Hof 2 pet dogs     Objective: Vital Signs: BP (!) 97/53 (BP Location: Left Arm, Patient Position: Sitting, Cuff Size: Normal)   Pulse 66   Resp 13   Ht 5\' 4"  (1.626 m)   Wt 131 lb (59.4 kg)   BMI 22.49 kg/m    Physical Exam  Constitutional: She is oriented to person, place, and time. She appears well-developed and well-nourished.  HENT:  Head: Normocephalic and atraumatic.  Eyes: Conjunctivae and EOM are normal.  Neck: Normal range of motion.  Cardiovascular: Normal rate, regular rhythm, normal heart sounds and intact distal pulses.   Pulmonary/Chest: Effort normal and breath sounds normal.  Abdominal: Soft. Bowel sounds are normal.  Lymphadenopathy:    She has no cervical adenopathy.  Neurological: She is alert and oriented to person, place, and time.  Skin: Skin is warm and dry. Capillary refill takes less than 2 seconds.  Psychiatric: She has a normal mood and affect. Her behavior is normal.  Nursing note and vitals reviewed.    Musculoskeletal Exam: C-spine and thoracic lumbar spine good range of motion. Shoulder joints elbow joints wrist joints are good range of motion. She has thickening of bilateral PIP/DIP joints in her hands and her feet. Although joints afford  range of motion with no synovitis.  CDAI Exam: No CDAI exam completed.    Investigation: Findings:  05/08/2016 ANA positive 1:160 titer/ speckled , CK, CCP, Rheumatoid factor, and Uric Acid normal     CMP Latest Ref Rng & Units 05/10/2016 03/01/2016 12/06/2015  Glucose 65 - 99 mg/dL 72 - 80  BUN 7 - 25 mg/dL 21 - 23  Creatinine 0.50 - 0.99 mg/dL 1.05(H) - 0.97  Sodium 135 - 146 mmol/L 141 - 139  Potassium 3.5 - 5.3 mmol/L 4.6 - 3.5  Chloride 98 - 110 mmol/L 98 - 99  CO2 20 - 31 mmol/L 30 - 32  Calcium 8.6 - 10.4 mg/dL 10.6(H) - 10.0  Total Protein 6.0 - 8.3 g/dL - 7.0 7.0  Total Bilirubin 0.2 - 1.2 mg/dL - 1.0 1.1  Alkaline Phos 39 - 117 U/L - 107 99  AST 0 - 37 U/L - 59(H) 27  ALT 0 - 35 U/L - 77(H) 42(H)    CBC Latest Ref Rng & Units 03/01/2016 12/06/2015 07/12/2014  WBC 4.0 - 10.5 K/uL 3.7(L) 4.3 4.4  Hemoglobin 12.0 - 15.0 g/dL 12.8 11.5(L) 13.8  Hematocrit 36.0 - 46.0 % 36.8 33.4(L) 40.3  Platelets 150.0 - 400.0 K/uL 207.0 255.0 225.0   Imaging: No results found.  Speciality Comments: No specialty comments available.    Procedures:  No procedures performed Allergies: Penicillins   Assessment / Plan:     Visit Diagnoses: Primary osteoarthritis of both hands: Joint protection and muscle strengthening was discussed.  DJD (degenerative joint disease), cervical: She has some stiffness  DDD lumbar spine: She's intermittent lower back discomfort  Myalgia - CK normal  Age-related osteoporosis with current pathological fracture with routine healing, subsequent encounter - 07/29/2014 T -2.5 - Plan: PTH, intact and calcium, Serum protein electrophoresis with reflex. She will have repeat bone density in July by her PCP. She plans to start on Prolia subcutaneous after that. I've advised her to take vitamin D 1000 units every day her vitamin D was in lower limits of normal. Need for doing resistive exercise was also discussed.   Positive ANA (antinuclear antibody) -she has no  clinical features of autoimmune disease or ANAs low titer. Although she's quite concerned about her positive ANA and would like further workup as she also has elevated LFTs. I'll obtain following labs Plan: C3 and C4, Sjogrens syndrome-B extractable nuclear antibody, Sjogrens syndrome-A extractable nuclear antibody, Anti-Smith antibody, RNP Antibody, Anti-DNA antibody, double-stranded . I will call her back with the lab results.  COLONIC POLYPS, HX OF  Hemochromatosis carrier  Elevated LFTs: Patient reports that her liver biopsy was negative in the past.  Rosanna Randy syndrome  Meniere's disease, unspecified laterality  History of phlebitis    Orders: Orders Placed This Encounter  Procedures  . C3 and C4  . PTH, intact and calcium  . Serum protein electrophoresis with reflex  . Sjogrens syndrome-B extractable nuclear antibody  . Sjogrens syndrome-A extractable nuclear antibody  . Anti-Smith antibody  . RNP Antibody  . Anti-DNA antibody, double-stranded   No orders of the defined types were placed in this encounter.   Face-to-face time spent with patient was 30 minutes. 50% of time was spent in counseling and coordination of care.  Follow-Up Instructions: Return in about 6 months (around 12/13/2016) for OA, OP.   Bo Merino, MD  Note - This record has been created using Editor, commissioning.  Chart creation errors have been sought, but may not always  have been located. Such creation errors do not reflect on  the standard of medical care.

## 2016-06-07 ENCOUNTER — Ambulatory Visit: Payer: Self-pay | Admitting: Rheumatology

## 2016-06-11 DIAGNOSIS — M47816 Spondylosis without myelopathy or radiculopathy, lumbar region: Secondary | ICD-10-CM | POA: Insufficient documentation

## 2016-06-12 ENCOUNTER — Encounter: Payer: Self-pay | Admitting: Internal Medicine

## 2016-06-12 ENCOUNTER — Ambulatory Visit (INDEPENDENT_AMBULATORY_CARE_PROVIDER_SITE_OTHER): Payer: PPO | Admitting: Rheumatology

## 2016-06-12 ENCOUNTER — Encounter: Payer: Self-pay | Admitting: Rheumatology

## 2016-06-12 VITALS — BP 97/53 | HR 66 | Resp 13 | Ht 64.0 in | Wt 131.0 lb

## 2016-06-12 DIAGNOSIS — R945 Abnormal results of liver function studies: Secondary | ICD-10-CM

## 2016-06-12 DIAGNOSIS — M791 Myalgia, unspecified site: Secondary | ICD-10-CM

## 2016-06-12 DIAGNOSIS — M8000XD Age-related osteoporosis with current pathological fracture, unspecified site, subsequent encounter for fracture with routine healing: Secondary | ICD-10-CM

## 2016-06-12 DIAGNOSIS — R768 Other specified abnormal immunological findings in serum: Secondary | ICD-10-CM | POA: Diagnosis not present

## 2016-06-12 DIAGNOSIS — M47816 Spondylosis without myelopathy or radiculopathy, lumbar region: Secondary | ICD-10-CM

## 2016-06-12 DIAGNOSIS — M545 Low back pain, unspecified: Secondary | ICD-10-CM

## 2016-06-12 DIAGNOSIS — M19041 Primary osteoarthritis, right hand: Secondary | ICD-10-CM

## 2016-06-12 DIAGNOSIS — Z148 Genetic carrier of other disease: Secondary | ICD-10-CM | POA: Diagnosis not present

## 2016-06-12 DIAGNOSIS — H8109 Meniere's disease, unspecified ear: Secondary | ICD-10-CM | POA: Diagnosis not present

## 2016-06-12 DIAGNOSIS — Z86010 Personal history of colonic polyps: Secondary | ICD-10-CM

## 2016-06-12 DIAGNOSIS — R7989 Other specified abnormal findings of blood chemistry: Secondary | ICD-10-CM

## 2016-06-12 DIAGNOSIS — M19042 Primary osteoarthritis, left hand: Secondary | ICD-10-CM

## 2016-06-12 DIAGNOSIS — M47812 Spondylosis without myelopathy or radiculopathy, cervical region: Secondary | ICD-10-CM

## 2016-06-12 DIAGNOSIS — Z8672 Personal history of thrombophlebitis: Secondary | ICD-10-CM | POA: Diagnosis not present

## 2016-06-12 DIAGNOSIS — M5030 Other cervical disc degeneration, unspecified cervical region: Secondary | ICD-10-CM | POA: Diagnosis not present

## 2016-06-13 LAB — ANTI-SMITH ANTIBODY: ENA SM AB SER-ACNC: NEGATIVE

## 2016-06-13 LAB — PROTEIN ELECTROPHORESIS, SERUM, WITH REFLEX
ALPHA-1-GLOBULIN: 0.3 g/dL (ref 0.2–0.3)
ALPHA-2-GLOBULIN: 0.7 g/dL (ref 0.5–0.9)
Albumin ELP: 4.6 g/dL (ref 3.8–4.8)
Beta 2: 0.3 g/dL (ref 0.2–0.5)
Beta Globulin: 0.5 g/dL (ref 0.4–0.6)
Gamma Globulin: 1.2 g/dL (ref 0.8–1.7)
Total Protein, Serum Electrophoresis: 7.5 g/dL (ref 6.1–8.1)

## 2016-06-13 LAB — SJOGRENS SYNDROME-A EXTRACTABLE NUCLEAR ANTIBODY: SSA (RO) (ENA) ANTIBODY, IGG: NEGATIVE

## 2016-06-13 LAB — C3 AND C4
C3 Complement: 101 mg/dL (ref 83–193)
C4 Complement: 28 mg/dL (ref 15–57)

## 2016-06-13 LAB — RNP ANTIBODY: Ribonucleic Protein(ENA) Antibody, IgG: <1

## 2016-06-13 LAB — PTH, INTACT AND CALCIUM
Calcium: 10.1 mg/dL (ref 8.6–10.4)
PTH: 49 pg/mL (ref 14–64)

## 2016-06-13 LAB — ANTI-DNA ANTIBODY, DOUBLE-STRANDED: ds DNA Ab: 1 IU/mL

## 2016-06-13 LAB — SJOGRENS SYNDROME-B EXTRACTABLE NUCLEAR ANTIBODY: SSB (La) (ENA) Antibody, IgG: <1

## 2016-06-13 NOTE — Progress Notes (Signed)
wnl

## 2016-06-14 ENCOUNTER — Encounter: Payer: Self-pay | Admitting: Internal Medicine

## 2016-06-14 NOTE — Telephone Encounter (Signed)
I reviewed your chart the best I can Somewhere around 2009 or before there was a scanned  [place] had reported mild fatty liver on a biopsy. Dr. Carlean Purl noted in his note Of course since that time ultrasound and repeat biopsy did not show evidence of that. We could put that this problem is resolved.  In regard to your liver tests I did flag Dr. Carlean Purl a while back and apologies  and didn't get  back with you  he  Reported that  since you've had multiple biopsies nl  continue to follow.  Yes, we should repeat LFTs ,I will make sure order is in   Also reviewed your record and because of the DES exposure, we should do a Pap smear more regularly and exam.up until age 66 .  (at yearly checkup in the fall we can do a Pap smear at that time. Even though you are 65 )

## 2016-07-10 DIAGNOSIS — H9311 Tinnitus, right ear: Secondary | ICD-10-CM | POA: Diagnosis not present

## 2016-07-10 DIAGNOSIS — H90A21 Sensorineural hearing loss, unilateral, right ear, with restricted hearing on the contralateral side: Secondary | ICD-10-CM | POA: Diagnosis not present

## 2016-07-10 DIAGNOSIS — H8101 Meniere's disease, right ear: Secondary | ICD-10-CM | POA: Diagnosis not present

## 2016-08-02 ENCOUNTER — Encounter: Payer: Self-pay | Admitting: Internal Medicine

## 2016-08-08 ENCOUNTER — Other Ambulatory Visit (INDEPENDENT_AMBULATORY_CARE_PROVIDER_SITE_OTHER): Payer: PPO

## 2016-08-08 DIAGNOSIS — R7989 Other specified abnormal findings of blood chemistry: Secondary | ICD-10-CM

## 2016-08-08 DIAGNOSIS — R945 Abnormal results of liver function studies: Secondary | ICD-10-CM | POA: Diagnosis not present

## 2016-08-08 LAB — HEPATIC FUNCTION PANEL
ALBUMIN: 4.4 g/dL (ref 3.5–5.2)
ALT: 26 U/L (ref 0–35)
AST: 24 U/L (ref 0–37)
Alkaline Phosphatase: 78 U/L (ref 39–117)
Bilirubin, Direct: 0.3 mg/dL (ref 0.0–0.3)
TOTAL PROTEIN: 6.9 g/dL (ref 6.0–8.3)
Total Bilirubin: 1.4 mg/dL — ABNORMAL HIGH (ref 0.2–1.2)

## 2016-08-20 ENCOUNTER — Encounter: Payer: Self-pay | Admitting: Internal Medicine

## 2016-08-20 ENCOUNTER — Ambulatory Visit (INDEPENDENT_AMBULATORY_CARE_PROVIDER_SITE_OTHER)
Admission: RE | Admit: 2016-08-20 | Discharge: 2016-08-20 | Disposition: A | Discharge status: Home / Self Care | Payer: PPO | Source: Ambulatory Visit | Attending: Internal Medicine | Admitting: Internal Medicine

## 2016-08-20 DIAGNOSIS — M810 Age-related osteoporosis without current pathological fracture: Secondary | ICD-10-CM

## 2016-08-21 ENCOUNTER — Telehealth: Payer: Self-pay | Admitting: Emergency Medicine

## 2016-08-21 NOTE — Telephone Encounter (Signed)
Spoke with patient in regards to receiving a bill for immunoglobulin A and tissue transglutaminas. Explained to patient that those two results are included in the Celiac screening. Patient questioned whether those were coded correctly? Explained to patient that sometimes insurance doesn't always cover the celiac screen. Patient state that she is going to call her insurance company to see why they will not cover these labs. Patient understood and had no further questions.

## 2016-10-03 ENCOUNTER — Telehealth: Payer: Self-pay

## 2016-10-03 NOTE — Telephone Encounter (Signed)
Spoke with the patient about getting her Prolia injection and she stated she will call back to schedule- she has out of pocket expence of $210 and she is ready to schedule any time

## 2016-10-11 ENCOUNTER — Encounter: Payer: Self-pay | Admitting: Internal Medicine

## 2016-10-11 ENCOUNTER — Encounter: Payer: Self-pay | Admitting: Family Medicine

## 2016-10-11 ENCOUNTER — Ambulatory Visit (INDEPENDENT_AMBULATORY_CARE_PROVIDER_SITE_OTHER): Payer: PPO | Admitting: Family Medicine

## 2016-10-11 VITALS — BP 92/58 | HR 80 | Temp 98.3°F | Ht 64.0 in | Wt 126.7 lb

## 2016-10-11 DIAGNOSIS — J069 Acute upper respiratory infection, unspecified: Secondary | ICD-10-CM | POA: Diagnosis not present

## 2016-10-11 DIAGNOSIS — B9789 Other viral agents as the cause of diseases classified elsewhere: Secondary | ICD-10-CM

## 2016-10-11 NOTE — Patient Instructions (Signed)
Viral Illness, Adult Viruses are tiny germs that can get into a person's body and cause illness. There are many different types of viruses, and they cause many types of illness. Viral illnesses can range from mild to severe. They can affect various parts of the body. Common illnesses that are caused by a virus include colds and the flu. Viral illnesses also include serious conditions such as HIV/AIDS (human immunodeficiency virus/acquired immunodeficiency syndrome). A few viruses have been linked to certain cancers. What are the causes? Many types of viruses can cause illness. Viruses invade cells in your body, multiply, and cause the infected cells to malfunction or die. When the cell dies, it releases more of the virus. When this happens, you develop symptoms of the illness, and the virus continues to spread to other cells. If the virus takes over the function of the cell, it can cause the cell to divide and grow out of control, as is the case when a virus causes cancer. Different viruses get into the body in different ways. You can get a virus by:  Swallowing food or water that is contaminated with the virus.  Breathing in droplets that have been coughed or sneezed into the air by an infected person.  Touching a surface that has been contaminated with the virus and then touching your eyes, nose, or mouth.  Being bitten by an insect or animal that carries the virus.  Having sexual contact with a person who is infected with the virus.  Being exposed to blood or fluids that contain the virus, either through an open cut or during a transfusion.  If a virus enters your body, your body's defense system (immune system) will try to fight the virus. You may be at higher risk for a viral illness if your immune system is weak. What are the signs or symptoms? Symptoms vary depending on the type of virus and the location of the cells that it invades. Common symptoms of the main types of viral illnesses  include: Cold and flu viruses  Fever.  Headache.  Sore throat.  Muscle aches.  Nasal congestion.  Cough. Digestive system (gastrointestinal) viruses  Fever.  Abdominal pain.  Nausea.  Diarrhea. Liver viruses (hepatitis)  Loss of appetite.  Tiredness.  Yellowing of the skin (jaundice). Brain and spinal cord viruses  Fever.  Headache.  Stiff neck.  Nausea and vomiting.  Confusion or sleepiness. Skin viruses  Warts.  Itching.  Rash. Sexually transmitted viruses  Discharge.  Swelling.  Redness.  Rash. How is this treated? Viruses can be difficult to treat because they live within cells. Antibiotic medicines do not treat viruses because these drugs do not get inside cells. Treatment for a viral illness may include:  Resting and drinking plenty of fluids.  Medicines to relieve symptoms. These can include over-the-counter medicine for pain and fever, medicines for cough or congestion, and medicines to relieve diarrhea.  Antiviral medicines. These drugs are available only for certain types of viruses. They may help reduce flu symptoms if taken early. There are also many antiviral medicines for hepatitis and HIV/AIDS.  Some viral illnesses can be prevented with vaccinations. A common example is the flu shot. Follow these instructions at home: Medicines   Take over-the-counter and prescription medicines only as told by your health care provider.  If you were prescribed an antiviral medicine, take it as told by your health care provider. Do not stop taking the medicine even if you start to feel better.  Be aware   of when antibiotics are needed and when they are not needed. Antibiotics do not treat viruses. If your health care provider thinks that you may have a bacterial infection as well as a viral infection, you may get an antibiotic. ? Do not ask for an antibiotic prescription if you have been diagnosed with a viral illness. That will not make your  illness go away faster. ? Frequently taking antibiotics when they are not needed can lead to antibiotic resistance. When this develops, the medicine no longer works against the bacteria that it normally fights. General instructions  Drink enough fluids to keep your urine clear or pale yellow.  Rest as much as possible.  Return to your normal activities as told by your health care provider. Ask your health care provider what activities are safe for you.  Keep all follow-up visits as told by your health care provider. This is important. How is this prevented? Take these actions to reduce your risk of viral infection:  Eat a healthy diet and get enough rest.  Wash your hands often with soap and water. This is especially important when you are in public places. If soap and water are not available, use hand sanitizer.  Avoid close contact with friends and family who have a viral illness.  If you travel to areas where viral gastrointestinal infection is common, avoid drinking water or eating raw food.  Keep your immunizations up to date. Get a flu shot every year as told by your health care provider.  Do not share toothbrushes, nail clippers, razors, or needles with other people.  Always practice safe sex.  Contact a health care provider if:  You have symptoms of a viral illness that do not go away.  Your symptoms come back after going away.  Your symptoms get worse. Get help right away if:  You have trouble breathing.  You have a severe headache or a stiff neck.  You have severe vomiting or abdominal pain. This information is not intended to replace advice given to you by your health care provider. Make sure you discuss any questions you have with your health care provider. Document Released: 05/19/2015 Document Revised: 06/21/2015 Document Reviewed: 05/19/2015 Elsevier Interactive Patient Education  2018 [employer]  

## 2016-10-11 NOTE — Progress Notes (Signed)
Subjective:     Patient ID: Donna Warren, female   DOB: 04-14-1950, 66 y.o.   MRN: [id number]  HPI Patient is nonsmoker seen with onset of illness few days ago. She thinks she had some low-grade fever couple days ago but none today. She's had some nasal congestion, sore throat, and nonproductive cough. No history of any respiratory difficulties. She's had some increased fatigue. Low-grade nausea but no vomiting. Occasional loose stools. No sick contacts. Cough is relatively mild. No dyspnea.  Past Medical History:  Diagnosis Date  . 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 Fatty liver    by bx 2008?   negative  bx in 2015   . Femur fracture, right ([place]) 2003  . Fibroids   . Gilbert's syndrome   . Hx of adenomatous colonic polyps   . Meniere disease    Right ear  . Osteoporosis   . Osteoporosis   . Other hemochromatosis    heterozygosity for h63d gene  . Spontaneous abortion    Past Surgical History:  Procedure Laterality Date  . BREAST CYST EXCISION Right   . [place]  . COLONOSCOPY  220, 2004, 2007, 08/13/2010   2002: 8 mm polyp 2004: no polyps 2007: no polyps 2012: hemorrhoids  . hosp r/o mi cv right chest and jaw pain  2010  . OVARIAN CYST REMOVAL    . spontaneously abortion on 3rd preg     triploid dna on path    reports that she has never smoked. She has never used smokeless tobacco. She reports that she drinks about 0.6 - 1.2 oz of alcohol per week . She reports that she does not use drugs. family history includes Anxiety disorder in her mother; Colon cancer in her father; Dementia in her mother; Depression in her mother; Hearing loss in her mother; Liver disease in her brother; Other in her brother, daughter, father, and mother; Stroke in her unknown relative. Allergies  Allergen Reactions  . Penicillins     rash     Review of Systems  Constitutional: Positive for fatigue and fever.  HENT: Positive for congestion and sore throat.    Respiratory: Positive for cough.   Neurological: Negative for headaches.       Objective:   Physical Exam  Constitutional: She appears well-developed and well-nourished.  HENT:  Right Ear: External ear normal.  Left Ear: External ear normal.  Mouth/Throat: Oropharynx is clear and moist. No oropharyngeal exudate.  Neck: Neck supple.  Cardiovascular: Normal rate and regular rhythm.   Pulmonary/Chest: Effort normal and breath sounds normal. No respiratory distress. She has no wheezes. She has no rales.  Lymphadenopathy:    She has no cervical adenopathy.       Assessment:     Probable viral URI with cough. She does not have any predictors of strep such as cervical adenopathy, age,exudate or post pharynx erythema, etc and presence of cough and nasal congestion makes this much less likely    Plan:     -Stay well-hydrated -Tylenol or Motrin as needed for body aches -Get plenty of rest -Follow-up for any increased fever or other concerns  Eulas Post MD [facility]  -

## 2016-11-22 ENCOUNTER — Telehealth: Payer: Self-pay

## 2016-11-22 NOTE — Telephone Encounter (Signed)
Left vm requesting patient call back to let me know if she is still interested in getting Prolia injection- she owes $210, no PA, and she is ready to be scheduled for a nurse visit

## 2016-11-29 ENCOUNTER — Ambulatory Visit (INDEPENDENT_AMBULATORY_CARE_PROVIDER_SITE_OTHER): Payer: PPO

## 2016-11-29 ENCOUNTER — Other Ambulatory Visit: Payer: Self-pay | Admitting: Podiatry

## 2016-11-29 ENCOUNTER — Encounter: Payer: Self-pay | Admitting: Podiatry

## 2016-11-29 ENCOUNTER — Ambulatory Visit: Payer: PPO | Admitting: Podiatry

## 2016-11-29 VITALS — BP 95/54 | HR 81

## 2016-11-29 DIAGNOSIS — M2040 Other hammer toe(s) (acquired), unspecified foot: Secondary | ICD-10-CM

## 2016-11-29 DIAGNOSIS — M79676 Pain in unspecified toe(s): Secondary | ICD-10-CM | POA: Diagnosis not present

## 2016-11-29 NOTE — Patient Instructions (Signed)

## 2016-12-10 NOTE — Progress Notes (Signed)
Office Visit Note  Patient: Donna Warren             Date of Birth: 07-12-1950           MRN: [id number]             PCP: Burnis Medin, MD Referring: Burnis Medin, MD Visit Date: 12/20/2016 Occupation: @GUAROCC @    Subjective:  Follow-up (JOINT SWELLING IN HANDS)   History of Present Illness: Donna Warren is a 66 y.o. female with history of osteoarthritis and DDD. She states she notices some redness in her DIP joints intermittently which gets better. She does not have much discomfort in her hands. She continues to have some discomfort in her C-spine and lumbar spine with doing certain activities. Although she does not have chronic pain.  Activities of Daily Living:  Patient reports morning stiffness for 0 minutes.   Patient Denies nocturnal pain.  Difficulty dressing/grooming: Denies Difficulty climbing stairs: Denies Difficulty getting out of chair: Denies Difficulty using hands for taps, buttons, cutlery, and/or writing: Denies   Review of Systems  Constitutional: Negative.  Negative for fatigue, night sweats, weight gain, weight loss and weakness.  HENT: Negative.  Negative for mouth sores, trouble swallowing, trouble swallowing, mouth dryness and nose dryness.   Eyes: Negative.  Negative for pain, redness, visual disturbance and dryness.  Respiratory: Negative.  Negative for cough, shortness of breath and difficulty breathing.   Cardiovascular: Negative.  Negative for chest pain, palpitations, hypertension, irregular heartbeat and swelling in legs/feet.  Gastrointestinal: Negative.  Negative for blood in stool, constipation and diarrhea.  Endocrine: Negative for increased urination.  Genitourinary: Negative for vaginal dryness.  Musculoskeletal: Positive for arthralgias, joint pain, joint swelling, myalgias, morning stiffness and myalgias. Negative for muscle weakness and muscle tenderness.  Skin: Negative.  Negative for color change, rash, hair loss, skin tightness,  ulcers and sensitivity to sunlight.  Allergic/Immunologic: Negative for susceptible to infections.  Neurological: Negative.  Negative for dizziness, numbness, headaches, memory loss and night sweats.  Hematological: Negative for swollen glands.  Psychiatric/Behavioral: Negative.  Negative for depressed mood and sleep disturbance. The patient is not nervous/anxious.     PMFS History:  Patient Active Problem List   Diagnosis Date Noted  . 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 Primary osteoarthritis of both hands 06/05/2016  . DJD (degenerative joint disease), cervical 05/08/2016  . Female genital lesion 08/12/2014  . Osteoporosis 07/20/2014  . Elevated LFTs 05/21/2013  . 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 Meniere's disease 10/14/2011  . Skin lesion 08/14/2011  . Right shoulder strain 08/13/2011  . Phlebitis, superficial 08/16/2010  . DECREASED HEARING, RIGHT EAR 11/08/2009  . PROBLEMS WITH HEARING 11/08/2009  . DYSLIPIDEMIA 10/07/2008  . Gilbert syndrome 10/26/2007  . FIBROIDS, UTERUS 05/29/2007  . LIVER HEMANGIOMA 05/29/2007  . COLONIC POLYPS, HX OF 05/29/2007    Past Medical History:  Diagnosis Date  . 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 Fatty liver hx of  2008  neg bx 2015    ? how dx  2008?   negative  bx in 2015   . Femur fracture, right ([place]) 2003  . Fibroids   . Gilbert's syndrome   . Hx of adenomatous colonic polyps   . Meniere disease    Right ear  . Osteoporosis   . Osteoporosis   .  Other hemochromatosis    heterozygosity for h63d gene  . Spontaneous abortion     Family History  Problem Relation Age of Onset  . Colon cancer Father   . Other Father        tinnitus  . Depression Mother   . Anxiety disorder Mother   . Dementia Mother   . Other Mother        high  calcium  . Hearing loss Mother        death  . Stroke Unknown        silent  . Liver disease Brother        elevated lft. Unknown if he actually has liver disease  . Other Brother        H63D hemochromatosis gene  . Other Daughter        H63d hemochromatosis    Past Surgical History:  Procedure Laterality Date  . BREAST CYST EXCISION Right   . [place]  . COLONOSCOPY  220, 2004, 2007, 08/13/2010   2002: 8 mm polyp 2004: no polyps 2007: no polyps 2012: hemorrhoids  . hosp r/o mi cv right chest and jaw pain  2010  . OVARIAN CYST REMOVAL    . spontaneously abortion on 3rd preg     triploid dna on path   Social History   Social History Narrative        2 daughters   Husband a [employer] [place] director  Working 49 - 33.  Travels    Daily caffeine use   Sleep  Often disrupted.    Alcohol about once a week.   Hof 2 pet dogs     Objective: Vital Signs: BP (!) 94/51 (BP Location: Left Arm, Patient Position: Sitting, Cuff Size: Normal)   Pulse 74   Ht 5\' 4"  (1.626 m)   Wt 130 lb (59 kg)   BMI 22.31 kg/m    Physical Exam  Constitutional: She is oriented to person, place, and time. She appears well-developed and well-nourished.  HENT:  Head: Normocephalic and atraumatic.  Eyes: Conjunctivae and EOM are normal.  Neck: Normal range of motion.  Cardiovascular: Normal rate, regular rhythm, normal heart sounds and intact distal pulses.  Pulmonary/Chest: Effort normal and breath sounds normal.  Abdominal: Soft. Bowel sounds are normal.  Lymphadenopathy:    She has no cervical adenopathy.  Neurological: She is alert and oriented to person, place, and time.  Skin: Skin is warm and dry. Capillary refill takes less than 2 seconds.  Psychiatric: She has a normal mood and affect. Her behavior is normal.  Nursing note and vitals reviewed.    Musculoskeletal Exam: C-spine and thoracic lumbar spine good range of motion. She has mild scoliosis in  the lumbar region. She some stiffness in range of motion of her C-spine. Shoulder joints elbow joints wrist joints are good range of motion. She has DIP thickening and PIP thickening in her hands and feet. These findings are consistent with osteoarthritis. No synovitis was noted. All the joints of range of motion with no synovitis.  CDAI Exam: No CDAI exam completed.    Investigation: No additional findings. CBC Latest Ref Rng & Units 12/11/2016 03/01/2016 12/06/2015  WBC 4.0 - 10.5 K/uL 4.1 3.7(L) 4.3  Hemoglobin 12.0 - 15.0 g/dL 11.9(L) 12.8 11.5(L)  Hematocrit 36.0 - 46.0 % 34.2(L) 36.8 33.4(L)  Platelets 150.0 - 400.0 K/uL 237.0 207.0 255.0   CMP Latest Ref Rng & Units 12/11/2016  08/08/2016 06/12/2016  Glucose 70 - 99 mg/dL 87 - -  BUN 6 - 23 mg/dL 24(H) - -  Creatinine 0.40 - 1.20 mg/dL 0.87 - -  Sodium 135 - 145 mEq/L 139 - -  Potassium 3.5 - 5.1 mEq/L 3.3(L) - -  Chloride 96 - 112 mEq/L 99 - -  CO2 19 - 32 mEq/L 33(H) - -  Calcium 8.4 - 10.5 mg/dL 9.6 - 10.1  Total Protein 6.0 - 8.3 g/dL 6.8 6.9 -  Total Bilirubin 0.2 - 1.2 mg/dL 1.7(H) 1.4(H) -  Alkaline Phos 39 - 117 U/L 86 78 -  AST 0 - 37 U/L 24 24 -  ALT 0 - 35 U/L 30 26 -    Imaging: Dg Foot 2 Views Left  Result Date: 11/29/2016 Please see detailed radiograph report in office note.  Dg Foot 2 Views Right  Result Date: 11/29/2016 Please see detailed radiograph report in office note.   Speciality Comments: No specialty comments available.    Procedures:  No procedures performed Allergies: Penicillins   Assessment / Plan:     Visit Diagnoses: Primary osteoarthritis of both hands: Patient has osteoarthritis in her bilateral hands with DIP PIP thickening. She notices intermittent redness and swelling but no discomfort. Joint protection and muscle strengthening was discussed. Natural anti-inflammatories were discussed at length.  DDD (degenerative disc disease), cervical: That muscle strengthening exercises were  discussed.  DDD (degenerative disc disease), lumbar: She's been doing exercises for her lower back.  Age-related osteoporosis without current pathological fracture - August 2018 T score -2.5 with change of -1.0% compared to the bone density of 2016.Marland Kitchen Reviewed the bone density. At this point she does not want to start bisphosphonates. Use of calcium and vitamin D and resistive exercises were discussed. Her vitamin D was low normal in the past. She will get repeat vitamin D with her PCP. I've advised her to use keep her vitamin D between 50 and 60.  Positive ANA (antinuclear antibody) - she has no clinical features of autoimmune disease or ANAs low titer.   Hemochromatosis carrier  Elevated LFTs - Patient reports that her liver biopsy was negative in the past.  Meniere's disease, unspecified laterality  Gilbert syndrome    Orders: No orders of the defined types were placed in this encounter.  No orders of the defined types were placed in this encounter.   Face-to-face time spent with patient was 30 minutes. Greater than 50% of time was spent in counseling and coordination of care.  Follow-Up Instructions: Return in about 1 year (around 12/20/2017) for DDD, Osteoarthritis.   Bo Merino, MD  Note - This record has been created using Editor, commissioning.  Chart creation errors have been sought, but may not always  have been located. Such creation errors do not reflect on  the standard of medical care.

## 2016-12-11 ENCOUNTER — Other Ambulatory Visit (HOSPITAL_COMMUNITY)
Admission: RE | Admit: 2016-12-11 | Discharge: 2016-12-11 | Disposition: A | Discharge status: Home / Self Care | Payer: PPO | Source: Ambulatory Visit | Attending: Internal Medicine | Admitting: Internal Medicine

## 2016-12-11 ENCOUNTER — Encounter: Payer: Self-pay | Admitting: Internal Medicine

## 2016-12-11 ENCOUNTER — Ambulatory Visit (INDEPENDENT_AMBULATORY_CARE_PROVIDER_SITE_OTHER): Payer: PPO | Admitting: Internal Medicine

## 2016-12-11 VITALS — BP 92/60 | HR 71 | Temp 98.0°F | Ht 63.39 in | Wt 127.6 lb

## 2016-12-11 DIAGNOSIS — Z148 Genetic carrier of other disease: Secondary | ICD-10-CM | POA: Diagnosis not present

## 2016-12-11 DIAGNOSIS — R945 Abnormal results of liver function studies: Secondary | ICD-10-CM | POA: Insufficient documentation

## 2016-12-11 DIAGNOSIS — R7989 Other specified abnormal findings of blood chemistry: Secondary | ICD-10-CM

## 2016-12-11 DIAGNOSIS — Z9189 Other specified personal risk factors, not elsewhere classified: Secondary | ICD-10-CM | POA: Diagnosis not present

## 2016-12-11 DIAGNOSIS — M810 Age-related osteoporosis without current pathological fracture: Secondary | ICD-10-CM | POA: Diagnosis not present

## 2016-12-11 DIAGNOSIS — Z79899 Other long term (current) drug therapy: Secondary | ICD-10-CM

## 2016-12-11 DIAGNOSIS — Z23 Encounter for immunization: Secondary | ICD-10-CM

## 2016-12-11 DIAGNOSIS — Z0000 Encounter for general adult medical examination without abnormal findings: Secondary | ICD-10-CM | POA: Insufficient documentation

## 2016-12-11 DIAGNOSIS — E785 Hyperlipidemia, unspecified: Secondary | ICD-10-CM | POA: Insufficient documentation

## 2016-12-11 DIAGNOSIS — G4700 Insomnia, unspecified: Secondary | ICD-10-CM

## 2016-12-11 LAB — CBC WITH DIFFERENTIAL/PLATELET
Basophils Absolute: 0 10*3/uL (ref 0.0–0.1)
Basophils Relative: 0.7 % (ref 0.0–3.0)
EOS PCT: 1.9 % (ref 0.0–5.0)
Eosinophils Absolute: 0.1 10*3/uL (ref 0.0–0.7)
HCT: 34.2 % — ABNORMAL LOW (ref 36.0–46.0)
Hemoglobin: 11.9 g/dL — ABNORMAL LOW (ref 12.0–15.0)
LYMPHS ABS: 1.8 10*3/uL (ref 0.7–4.0)
Lymphocytes Relative: 42.9 % (ref 12.0–46.0)
MCHC: 34.7 g/dL (ref 30.0–36.0)
MCV: 95.1 fl (ref 78.0–100.0)
Monocytes Absolute: 0.3 10*3/uL (ref 0.1–1.0)
Monocytes Relative: 7.1 % (ref 3.0–12.0)
NEUTROS PCT: 47.4 % (ref 43.0–77.0)
Neutro Abs: 1.9 10*3/uL (ref 1.4–7.7)
Platelets: 237 10*3/uL (ref 150.0–400.0)
RBC: 3.6 Mil/uL — AB (ref 3.87–5.11)
RDW: 13 % (ref 11.5–15.5)
WBC: 4.1 10*3/uL (ref 4.0–10.5)

## 2016-12-11 LAB — HEPATIC FUNCTION PANEL
ALBUMIN: 4.4 g/dL (ref 3.5–5.2)
ALK PHOS: 86 U/L (ref 39–117)
ALT: 30 U/L (ref 0–35)
AST: 24 U/L (ref 0–37)
BILIRUBIN DIRECT: 0.3 mg/dL (ref 0.0–0.3)
TOTAL PROTEIN: 6.8 g/dL (ref 6.0–8.3)
Total Bilirubin: 1.7 mg/dL — ABNORMAL HIGH (ref 0.2–1.2)

## 2016-12-11 LAB — LIPID PANEL
Cholesterol: 217 mg/dL — ABNORMAL HIGH (ref 0–200)
HDL: 80.5 mg/dL (ref >39.00)
LDL Cholesterol: 124 mg/dL — ABNORMAL HIGH (ref 0–99)
NONHDL: 136.88
TRIGLYCERIDES: 62 mg/dL (ref 0.0–149.0)
Total CHOL/HDL Ratio: 3
VLDL: 12.4 mg/dL (ref 0.0–40.0)

## 2016-12-11 LAB — BASIC METABOLIC PANEL
BUN: 24 mg/dL — AB (ref 6–23)
CO2: 33 mEq/L — ABNORMAL HIGH (ref 19–32)
CREATININE: 0.87 mg/dL (ref 0.40–1.20)
Calcium: 9.6 mg/dL (ref 8.4–10.5)
Chloride: 99 mEq/L (ref 96–112)
GFR: 69.23 mL/min (ref >60.00)
GLUCOSE: 87 mg/dL (ref 70–99)
POTASSIUM: 3.3 meq/L — AB (ref 3.5–5.1)
Sodium: 139 mEq/L (ref 135–145)

## 2016-12-11 MED ORDER — ZOLPIDEM TARTRATE 5 MG PO TABS: 5.0000 mg | ORAL_TABLET | Freq: Every evening | ORAL | 0 refills | Status: DC | PRN

## 2016-12-11 NOTE — Patient Instructions (Addendum)
Pap and exam every 3 years until age 66   Will pu in ofders for liver panel  Standing order to be obtained every 3-4 months or  As needed .   Send an e mail about the  Record observations.   Will notify you  of labs when available.   caution with sleep aids but may help reset clock  When traveling.  Routines light and dark exercise etc can be helpful  Insomnia Insomnia is a sleep disorder that makes it difficult to fall asleep or to stay asleep. Insomnia can cause tiredness (fatigue), low energy, difficulty concentrating, mood swings, and poor performance at work or school. There are three different ways to classify insomnia:  Difficulty falling asleep.  Difficulty staying asleep.  Waking up too early in the morning.  Any type of insomnia can be long-term (chronic) or short-term (acute). Both are common. Short-term insomnia usually lasts for three months or less. Chronic insomnia occurs at least three times a week for longer than three months. What are the causes? Insomnia may be caused by another condition, situation, or substance, such as:  Anxiety.  Certain medicines.  Gastroesophageal reflux disease (GERD) or other gastrointestinal conditions.  Asthma or other breathing conditions.  Restless legs syndrome, sleep apnea, or other sleep disorders.  Chronic pain.  Menopause. This may include hot flashes.  Stroke.  Abuse of alcohol, tobacco, or illegal drugs.  Depression.  Caffeine.  Neurological disorders, such as Alzheimer disease.  An overactive thyroid (hyperthyroidism).  The cause of insomnia may not be known. What increases the risk? Risk factors for insomnia include:  Gender. Women are more commonly affected than men.  Age. Insomnia is more common as you get older.  Stress. This may involve your professional or personal life.  Income. Insomnia is more common in people with lower income.  Lack of exercise.  Irregular work schedule or night  shifts.  Traveling between different time zones.  What are the signs or symptoms? If you have insomnia, trouble falling asleep or trouble staying asleep is the main symptom. This may lead to other symptoms, such as:  Feeling fatigued.  Feeling nervous about going to sleep.  Not feeling rested in the morning.  Having trouble concentrating.  Feeling irritable, anxious, or depressed.  How is this treated? Treatment for insomnia depends on the cause. If your insomnia is caused by an underlying condition, treatment will focus on addressing the condition. Treatment may also include:  Medicines to help you sleep.  Counseling or therapy.  Lifestyle adjustments.  Follow these instructions at home:  Take medicines only as directed by your health care provider.  Keep regular sleeping and waking hours. Avoid naps.  Keep a sleep diary to help you and your health care provider figure out what could be causing your insomnia. Include: ? When you sleep. ? When you wake up during the night. ? How well you sleep. ? How rested you feel the next day. ? Any side effects of medicines you are taking. ? What you eat and drink.  Make your bedroom a comfortable place where it is easy to fall asleep: ? Put up shades or special blackout curtains to block light from outside. ? Use a white noise machine to block noise. ? Keep the temperature cool.  Exercise regularly as directed by your health care provider. Avoid exercising right before bedtime.  Use relaxation techniques to manage stress. Ask your health care provider to suggest some techniques that may work well for  you. These may include: ? Breathing exercises. ? Routines to release muscle tension. ? Visualizing peaceful scenes.  Cut back on alcohol, caffeinated beverages, and cigarettes, especially close to bedtime. These can disrupt your sleep.  Do not overeat or eat spicy foods right before bedtime. This can lead to digestive discomfort  that can make it hard for you to sleep.  Limit screen use before bedtime. This includes: ? Watching TV. ? Using your smartphone, tablet, and computer.  Stick to a routine. This can help you fall asleep faster. Try to do a quiet activity, brush your teeth, and go to bed at the same time each night.  Get out of bed if you are still awake after 15 minutes of trying to sleep. Keep the lights down, but try reading or doing a quiet activity. When you feel sleepy, go back to bed.  Make sure that you drive carefully. Avoid driving if you feel very sleepy.  Keep all follow-up appointments as directed by your health care provider. This is important. Contact a health care provider if:  You are tired throughout the day or have trouble in your daily routine due to sleepiness.  You continue to have sleep problems or your sleep problems get worse. Get help right away if:  You have serious thoughts about hurting yourself or someone else. This information is not intended to replace advice given to you by your health care provider. Make sure you discuss any questions you have with your health care provider. Document Released: 01/05/2000 Document Revised: 06/09/2015 Document Reviewed: 10/08/2013 Elsevier Interactive Patient Education  2018 [place] 65 Years and Older, Female Preventive care refers to lifestyle choices and visits with your health care provider that can promote health and wellness. What does preventive care include?  A yearly physical exam. This is also called an annual well check.  Dental exams once or twice a year.  Routine eye exams. Ask your health care provider how often you should have your eyes checked.  Personal lifestyle choices, including: ? Daily care of your teeth and gums. ? Regular physical activity. ? Eating a healthy diet. ? Avoiding tobacco and drug use. ? Limiting alcohol use. ? Practicing safe sex. ? Taking low-dose aspirin every  day. ? Taking vitamin and mineral supplements as recommended by your health care provider. What happens during an annual well check? The services and screenings done by your health care provider during your annual well check will depend on your age, overall health, lifestyle risk factors, and family history of disease. Counseling Your health care provider may ask you questions about your:  Alcohol use.  Tobacco use.  Drug use.  Emotional well-being.  Home and relationship well-being.  Sexual activity.  Eating habits.  History of falls.  Memory and ability to understand (cognition).  Work and work Statistician.  Reproductive health.  Screening You may have the following tests or measurements:  Height, weight, and BMI.  Blood pressure.  Lipid and cholesterol levels. These may be checked every 5 years, or more frequently if you are over 62 years old.  Skin check.  Lung cancer screening. You may have this screening every year starting at age 110 if you have a 30-pack-year history of smoking and currently smoke or have quit within the past 15 years.  Fecal occult blood test (FOBT) of the stool. You may have this test every year starting at age 28.  Flexible sigmoidoscopy or colonoscopy. You may have  a sigmoidoscopy every 5 years or a colonoscopy every 10 years starting at age 44.  Hepatitis C blood test.  Hepatitis B blood test.  Sexually transmitted disease (STD) testing.  Diabetes screening. This is done by checking your blood sugar (glucose) after you have not eaten for a while (fasting). You may have this done every 1-3 years.  Bone density scan. This is done to screen for osteoporosis. You may have this done starting at age 77.  Mammogram. This may be done every 1-2 years. Talk to your health care provider about how often you should have regular mammograms.  Talk with your health care provider about your test results, treatment options, and if necessary, the  need for more tests. Vaccines Your health care provider may recommend certain vaccines, such as:  Influenza vaccine. This is recommended every year.  Tetanus, diphtheria, and acellular pertussis (Tdap, Td) vaccine. You may need a Td booster every 10 years.  Varicella vaccine. You may need this if you have not been vaccinated.  Zoster vaccine. You may need this after age 43.  Measles, mumps, and rubella (MMR) vaccine. You may need at least one dose of MMR if you were born in 1957 or later. You may also need a second dose.  Pneumococcal 13-valent conjugate (PCV13) vaccine. One dose is recommended after age 39.  Pneumococcal polysaccharide (PPSV23) vaccine. One dose is recommended after age 80.  Meningococcal vaccine. You may need this if you have certain conditions.  Hepatitis A vaccine. You may need this if you have certain conditions or if you travel or work in places where you may be exposed to hepatitis A.  Hepatitis B vaccine. You may need this if you have certain conditions or if you travel or work in places where you may be exposed to hepatitis B.  Haemophilus influenzae type b (Hib) vaccine. You may need this if you have certain conditions.  Talk to your health care provider about which screenings and vaccines you need and how often you need them. This information is not intended to replace advice given to you by your health care provider. Make sure you discuss any questions you have with your health care provider. Document Released: 02/03/2015 Document Revised: 09/27/2015 Document Reviewed: 11/08/2014 Elsevier Interactive Patient Education  2017 [employer].

## 2016-12-11 NOTE — Progress Notes (Signed)
Chief Complaint  Patient presents with  . Annual Exam    discuss sleep. discuss alternatives to Prolia    HPI: Donna Warren 66 y.o. comes in today for Preventive Medicare exam/ wellness visit . And fu number of conditions   abd lfts   Following  Nl last time.  Monitoring every 3-4 mos plan.  Had ?s about ehr and  Dx  Of sorts and accuracy .  menieres in remission doing well follow dr Raliegh Ip every 6-12 mos  Sleep problematic   travels some stress but ok otherwise  ocass etoh  Asks about sleep aids  But not  Hot on meds  Osteoporosis   Reluctant to add medication as stays in body  / about calcium level at some point   Exposed to des  Had paps and colpos when younger   Up until had children   Saw rheum noted to have djd arthritis.     Health Maintenance  Topic Date Due  . INFLUENZA VACCINE  08/21/2016  . PNA vac Low Risk Adult (2 of 2 - PPSV23) 12/05/2016  . TETANUS/TDAP  06/18/2017  . COLONOSCOPY  08/12/2017  . MAMMOGRAM  04/19/2018  . DEXA SCAN  Completed  . Hepatitis C Screening  Completed   Health Maintenance Review LIFESTYLE:  Exercise:  Not as much walk Tobacco/ETS:n Alcohol: 1 per week  Sugar beverages:n Sleep: problematic  Up to 8  Travels  Drug use: no HH: 2 plus dog  On the list for shingrix     Hearing:  Sees dr Elwyn Reach for meieres   Vision:  No limitations at present . Last eye check UTD  Safety:  Has smoke detector and wears seat belts.  No firearms. No excess sun exposure. Sees dentist regularly.  Falls: n  Depression: No anhedonia unusual crying or depressive symptoms  Nutrition: Eats well balanced diet; adequate calcium and vitamin D. No swallowing chewing problems.  Injury: no major injuries in the last six months.  Other healthcare providers:  Reviewed today .  Social:  Lives with spouse married. Dog pets.   Preventive parameters: up-to-date  Reviewed   ADLS:   There are no problems or need for assistance  driving, feeding, obtaining  food, dressing, toileting and bathing, managing money using phone. She is independent.  ROS:  GEN/ HEENT: No fever, significant weight changes sweats headaches vision problems hearing changes, CV/ PULM; No chest pain shortness of breath cough, syncope,edema  change in exercise tolerance. GI /GU: No adominal pain, vomiting, change in bowel habits. No blood in the stool. No significant GU symptoms. SKIN/HEME: ,no acute skin rashes suspicious lesions or bleeding. No lymphadenopathy, nodules, masses.  NEURO/ PSYCH:  No neurologic signs such as weakness numbness. No depression anxiety. IMM/ Allergy: No unusual infections.  Allergy .   REST of 12 system review negative except as per HPI   Past Medical History:  Diagnosis Date  . 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 Fatty liver hx of  2008  neg bx 2015    ? how dx  2008?   negative  bx in 2015   . Femur fracture, right ([place]) 2003  . Fibroids   . Gilbert's syndrome   . Hx of adenomatous colonic polyps   . Meniere disease    Right ear  . Osteoporosis   . Osteoporosis   . Other hemochromatosis    heterozygosity for h63d gene  .  Spontaneous abortion     Family History  Problem Relation Age of Onset  . Colon cancer Father   . Other Father        tinnitus  . Depression Mother   . Anxiety disorder Mother   . Dementia Mother   . Other Mother        high calcium  . Hearing loss Mother        death  . Stroke Unknown        silent  . Liver disease Brother        elevated lft. Unknown if he actually has liver disease  . Other Brother        H63D hemochromatosis gene  . Other Daughter        H63d hemochromatosis     Social History   Socioeconomic History  . Marital status: Married    Spouse name: None  . Number of children: None  . Years of education: None  . Highest education level: None  Social Needs  . Financial resource strain: None  .  Food insecurity - worry: None  . Food insecurity - inability: None  . Transportation needs - medical: None  . Transportation needs - non-medical: None  Occupational History  . None  Tobacco Use  . Smoking status: Never Smoker  . Smokeless tobacco: Never Used  Substance and Sexual Activity  . Alcohol use: Yes    Alcohol/week: 0.6 - 1.2 oz    Types: 1 - 2 Glasses of wine per week  . Drug use: No  . Sexual activity: None  Other Topics Concern  . None  Social History Narrative        2 daughters   Husband a Secondary school teacher  Working 59 - 81.  Travels    Daily caffeine use   Sleep  Often disrupted.    Alcohol about once a week.   Hof 2 pet dogs    Outpatient Encounter Medications as of 12/11/2016  Medication Sig  . Calcium Carb-Cholecalciferol (CALCIUM 600/VITAMIN D3) 600-800 MG-UNIT TABS Take 1 tablet by mouth daily.  . chlorthalidone (HYGROTON) 25 MG tablet Take 25 mg by mouth daily.  . cholecalciferol (VITAMIN D) 1000 units tablet Take 1 tablet by mouth daily.  Marland Kitchen zolpidem (AMBIEN) 5 MG tablet Take 1 tablet (5 mg total) by mouth at bedtime as needed for sleep. Avoid regular use   No facility-administered encounter medications on file as of 12/11/2016.     EXAM:  BP 92/60 (BP Location: Left Arm, Patient Position: Sitting, Cuff Size: Normal)   Pulse 71   Temp 98 F (36.7 C) (Oral)   Ht 5' 3.39" (1.61 m)   Wt 127 lb 9.6 oz (57.9 kg)   BMI 22.33 kg/m   Body mass index is 22.33 kg/m.  Physical Exam: Vital signs reviewed MVE:HMCN is a well-developed well-nourished alert cooperative   who appears stated age in no acute distress.  HEENT: normocephalic atraumatic , Eyes: PERRL EOM's full, conjunctiva clear, Nares: paten,t no deformity discharge or tenderness., Ears: no deformity EAC's clear TMs with normal landmarks. Mouth: clear OP, no lesions, edema.  Moist mucous membranes. Dentition in adequate repair. NECK: supple without masses,  thyromegaly or bruits. CHEST/PULM:  Clear to auscultation and percussion breath sounds equal no wheeze , rales or rhonchi. No chest wall deformities or tenderness. CV: PMI is nondisplaced, S1 S2 no gallops, murmurs, rubs. Peripheral pulses are full without delay.No JVD .  ABDOMEN: Bowel sounds normal nontender  No guard or rebound, no hepato splenomegal no CVA tenderness.   Extremtities:  No clubbing cyanosis or edema, no acute joint swelling or redness no focal atrophy NEURO:  Oriented x3, cranial nerves 3-12 appear to be intact, no obvious focal weakness,gait within normal limits no abnormal reflexes or asymmetrical SKIN: No acute rashes normal turgor, color, no bruising or petechiae. PSYCH: Oriented, good eye contact, no obvious depression anxiety, cognition and judgment appear normal. LN: no cervical axillary inguinal adenopathy No noted deficits in memory, attention, and speech. Pelvic: NL ext GU, 5-6  mm clear skin cyst soft post opening  labia clear without lesions or rash . Vagina no lesions .Cervix: post  No lesin seen   UTERUS: Neg CMT Adnexa:  clear no felt masses . PAP done  hpv is ascus    ASSESSMENT AND PLAN:  Discussed the following assessment and plan:  Visit for preventive health examination - Plan: Cytology - PAP  Hemochromatosis carrier - check ferritin ibc periodically - Plan: Basic metabolic panel, CBC with Differential/Platelet, Hepatic function panel, Lipid panel, Iron, TIBC and Ferritin Panel  Abnormal LFTs - Plan: Basic metabolic panel, CBC with Differential/Platelet, Hepatic function panel, Lipid panel, Iron, TIBC and Ferritin Panel, Hepatic function panel  Osteoporosis without current pathological fracture, unspecified osteoporosis type - Plan: Basic metabolic panel, CBC with Differential/Platelet, Hepatic function panel, Lipid panel, Iron, TIBC and Ferritin Panel  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 - Plan: Basic metabolic panel, CBC with  Differential/Platelet, Hepatic function panel, Lipid panel, Iron, TIBC and Ferritin Panel  Medication management - Plan: Basic metabolic panel, CBC with Differential/Platelet, Hepatic function panel, Lipid panel, Iron, TIBC and Ferritin Panel  Hyperlipidemia, unspecified hyperlipidemia type - Plan: Basic metabolic panel, CBC with Differential/Platelet, Hepatic function panel, Lipid panel, Iron, TIBC and Ferritin Panel  Need for influenza vaccination - Plan: Flu vaccine HIGH DOSE PF (Fluzone High dose)  Need for pneumococcal vaccination - Plan: Pneumococcal polysaccharide vaccine 23-valent greater than or equal to 2yo subcutaneous/IM  On potassium wasting diuretic therapy  Insomnia, unspecified type  Discussed osteoporosis medications will think about it she is very hesitant to add medication for prevention we will discuss more with Dr. Darnell Level reviewed rationale.  Sleep problematic discussed lifestyle changes patterns sleep hygiene small amount of Ambien given for travel with caution.  We will put in standing orders for LFTs to be done every 3-4 months.  So that she can make an appointment on a routine basis.  Have her send in a message about the things in my chart and her chart that she has concerns about accuracy and we will do the best we can to review and correct.  Pelvic exam yearly  Pap q 3 years ( misstated in instructions)   The 10-year ASCVD risk score [name] DC Jr., et al., 2013) is: 3%   Values used to calculate the score:     Age: 86 years     Sex: Female     Is Non-Hispanic African American: No     Diabetic: No     Tobacco smoker: No     Systolic Blood Pressure: 92 mmHg     Is BP treated: No     HDL Cholesterol: 80.5 mg/dL     Total Cholesterol: 217 mg/dL  Patient Care Team: Burnis Medin, MD as PCP - General Carlean Purl Ofilia Neas, MD (Gastroenterology) Jari Pigg, MD as Consulting Physician (Dermatology) Vicie Mutters, MD as Consulting Physician (Otolaryngology)  Lab  Results  Component Value Date   WBC 4.1 12/11/2016   HGB 11.9 (L) 12/11/2016   HCT 34.2 (L) 12/11/2016   PLT 237.0 12/11/2016   GLUCOSE 87 12/11/2016   CHOL 217 (H) 12/11/2016   TRIG 62.0 12/11/2016   HDL 80.50 12/11/2016   LDLDIRECT 129.1 02/16/2013   LDLCALC 124 (H) 12/11/2016   ALT 30 12/11/2016   AST 24 12/11/2016   NA 139 12/11/2016   K 3.3 (L) 12/11/2016   CL 99 12/11/2016   CREATININE 0.87 12/11/2016   BUN 24 (H) 12/11/2016   CO2 33 (H) 12/11/2016   TSH 1.38 12/06/2015   INR 0.92 06/23/2013   HGBA1C 5.1 09/21/2008    Patient Instructions  Pap and exam every 3 years until age 48   Will pu in ofders for liver panel  Standing order to be obtained every 3-4 months or  As needed .   Send an e mail about the  Record observations.   Will notify you  of labs when available.   caution with sleep aids but may help reset clock  When traveling.  Routines light and dark exercise etc can be helpful  Insomnia Insomnia is a sleep disorder that makes it difficult to fall asleep or to stay asleep. Insomnia can cause tiredness (fatigue), low energy, difficulty concentrating, mood swings, and poor performance at work or school. There are three different ways to classify insomnia:  Difficulty falling asleep.  Difficulty staying asleep.  Waking up too early in the morning.  Any type of insomnia can be long-term (chronic) or short-term (acute). Both are common. Short-term insomnia usually lasts for three months or less. Chronic insomnia occurs at least three times a week for longer than three months. What are the causes? Insomnia may be caused by another condition, situation, or substance, such as:  Anxiety.  Certain medicines.  Gastroesophageal reflux disease (GERD) or other gastrointestinal conditions.  Asthma or other breathing conditions.  Restless legs syndrome, sleep apnea, or other sleep disorders.  Chronic pain.  Menopause. This may include hot  flashes.  Stroke.  Abuse of alcohol, tobacco, or illegal drugs.  Depression.  Caffeine.  Neurological disorders, such as Alzheimer disease.  An overactive thyroid (hyperthyroidism).  The cause of insomnia may not be known. What increases the risk? Risk factors for insomnia include:  Gender. Women are more commonly affected than men.  Age. Insomnia is more common as you get older.  Stress. This may involve your professional or personal life.  Income. Insomnia is more common in people with lower income.  Lack of exercise.  Irregular work schedule or night shifts.  Traveling between different time zones.  What are the signs or symptoms? If you have insomnia, trouble falling asleep or trouble staying asleep is the main symptom. This may lead to other symptoms, such as:  Feeling fatigued.  Feeling nervous about going to sleep.  Not feeling rested in the morning.  Having trouble concentrating.  Feeling irritable, anxious, or depressed.  How is this treated? Treatment for insomnia depends on the cause. If your insomnia is caused by an underlying condition, treatment will focus on addressing the condition. Treatment may also include:  Medicines to help you sleep.  Counseling or therapy.  Lifestyle adjustments.  Follow these instructions at home:  Take medicines only as directed by your health care provider.  Keep regular sleeping and waking hours. Avoid naps.  Keep a sleep diary to help you and your health care provider figure  out what could be causing your insomnia. Include: ? When you sleep. ? When you wake up during the night. ? How well you sleep. ? How rested you feel the next day. ? Any side effects of medicines you are taking. ? What you eat and drink.  Make your bedroom a comfortable place where it is easy to fall asleep: ? Put up shades or special blackout curtains to block light from outside. ? Use a white noise machine to block noise. ? Keep  the temperature cool.  Exercise regularly as directed by your health care provider. Avoid exercising right before bedtime.  Use relaxation techniques to manage stress. Ask your health care provider to suggest some techniques that may work well for you. These may include: ? Breathing exercises. ? Routines to release muscle tension. ? Visualizing peaceful scenes.  Cut back on alcohol, caffeinated beverages, and cigarettes, especially close to bedtime. These can disrupt your sleep.  Do not overeat or eat spicy foods right before bedtime. This can lead to digestive discomfort that can make it hard for you to sleep.  Limit screen use before bedtime. This includes: ? Watching TV. ? Using your smartphone, tablet, and computer.  Stick to a routine. This can help you fall asleep faster. Try to do a quiet activity, brush your teeth, and go to bed at the same time each night.  Get out of bed if you are still awake after 15 minutes of trying to sleep. Keep the lights down, but try reading or doing a quiet activity. When you feel sleepy, go back to bed.  Make sure that you drive carefully. Avoid driving if you feel very sleepy.  Keep all follow-up appointments as directed by your health care provider. This is important. Contact a health care provider if:  You are tired throughout the day or have trouble in your daily routine due to sleepiness.  You continue to have sleep problems or your sleep problems get worse. Get help right away if:  You have serious thoughts about hurting yourself or someone else. This information is not intended to replace advice given to you by your health care provider. Make sure you discuss any questions you have with your health care provider. Document Released: 01/05/2000 Document Revised: 06/09/2015 Document Reviewed: 10/08/2013 Elsevier Interactive Patient Education  2018 [place] 65 Years and Older, Female Preventive care refers to  lifestyle choices and visits with your health care provider that can promote health and wellness. What does preventive care include?  A yearly physical exam. This is also called an annual well check.  Dental exams once or twice a year.  Routine eye exams. Ask your health care provider how often you should have your eyes checked.  Personal lifestyle choices, including: ? Daily care of your teeth and gums. ? Regular physical activity. ? Eating a healthy diet. ? Avoiding tobacco and drug use. ? Limiting alcohol use. ? Practicing safe sex. ? Taking low-dose aspirin every day. ? Taking vitamin and mineral supplements as recommended by your health care provider. What happens during an annual well check? The services and screenings done by your health care provider during your annual well check will depend on your age, overall health, lifestyle risk factors, and family history of disease. Counseling Your health care provider may ask you questions about your:  Alcohol use.  Tobacco use.  Drug use.  Emotional well-being.  Home and relationship well-being.  Sexual activity.  Eating  habits.  History of falls.  Memory and ability to understand (cognition).  Work and work Statistician.  Reproductive health.  Screening You may have the following tests or measurements:  Height, weight, and BMI.  Blood pressure.  Lipid and cholesterol levels. These may be checked every 5 years, or more frequently if you are over 74 years old.  Skin check.  Lung cancer screening. You may have this screening every year starting at age 51 if you have a 30-pack-year history of smoking and currently smoke or have quit within the past 15 years.  Fecal occult blood test (FOBT) of the stool. You may have this test every year starting at age 42.  Flexible sigmoidoscopy or colonoscopy. You may have a sigmoidoscopy every 5 years or a colonoscopy every 10 years starting at age 29.  Hepatitis C blood  test.  Hepatitis B blood test.  Sexually transmitted disease (STD) testing.  Diabetes screening. This is done by checking your blood sugar (glucose) after you have not eaten for a while (fasting). You may have this done every 1-3 years.  Bone density scan. This is done to screen for osteoporosis. You may have this done starting at age 73.  Mammogram. This may be done every 1-2 years. Talk to your health care provider about how often you should have regular mammograms.  Talk with your health care provider about your test results, treatment options, and if necessary, the need for more tests. Vaccines Your health care provider may recommend certain vaccines, such as:  Influenza vaccine. This is recommended every year.  Tetanus, diphtheria, and acellular pertussis (Tdap, Td) vaccine. You may need a Td booster every 10 years.  Varicella vaccine. You may need this if you have not been vaccinated.  Zoster vaccine. You may need this after age 47.  Measles, mumps, and rubella (MMR) vaccine. You may need at least one dose of MMR if you were born in 1957 or later. You may also need a second dose.  Pneumococcal 13-valent conjugate (PCV13) vaccine. One dose is recommended after age 30.  Pneumococcal polysaccharide (PPSV23) vaccine. One dose is recommended after age 56.  Meningococcal vaccine. You may need this if you have certain conditions.  Hepatitis A vaccine. You may need this if you have certain conditions or if you travel or work in places where you may be exposed to hepatitis A.  Hepatitis B vaccine. You may need this if you have certain conditions or if you travel or work in places where you may be exposed to hepatitis B.  Haemophilus influenzae type b (Hib) vaccine. You may need this if you have certain conditions.  Talk to your health care provider about which screenings and vaccines you need and how often you need them. This information is not intended to replace advice given to  you by your health care provider. Make sure you discuss any questions you have with your health care provider. Document Released: 02/03/2015 Document Revised: 09/27/2015 Document Reviewed: 11/08/2014 Elsevier Interactive Patient Education  2017 [place]. Syrina Wake M.D.

## 2016-12-12 LAB — IRON,TIBC AND FERRITIN PANEL
%SAT: 21 % (calc) (ref 11–50)
Ferritin: 19 ng/mL — ABNORMAL LOW (ref 20–288)
Iron: 80 ug/dL (ref 45–160)
TIBC: 375 ug/dL (ref 250–450)

## 2016-12-16 LAB — CYTOLOGY - PAP: Diagnosis: NEGATIVE

## 2016-12-16 NOTE — Progress Notes (Signed)
Tell patient PAP is normal.

## 2016-12-17 ENCOUNTER — Telehealth: Payer: Self-pay | Admitting: Internal Medicine

## 2016-12-17 ENCOUNTER — Telehealth: Payer: Self-pay | Admitting: Family Medicine

## 2016-12-17 DIAGNOSIS — E876 Hypokalemia: Secondary | ICD-10-CM

## 2016-12-17 NOTE — Telephone Encounter (Signed)
Called in for lab results.   Results given. Potassium level is low.   Most likely from your diuretic for the Meniere.   Option 1 is to increase higher potassium foods;  Option 2 is a Rx for potassium pills, 20Meq per day.   Disp 30 refill X1.   Either way repeat potassium level in 3 weeks.   Liver tests are stable basically normal for her. Cholesterol about the same Favorable ratio. Borderline low hemoglobin and iron (ie no excess iron levels)   Will follow Also correction on our preventive plan:  Should have a pelvic exam every year but a PAP test every 3 years.   She is going to call back to front desk for lab draw.   She has standing orders for liver labs and can usually just go in without a lab appt.

## 2016-12-17 NOTE — Telephone Encounter (Signed)
Copied from [place]. Topic: Inquiry >> Dec 17, 2016  9:23 AM Cecelia Byars, NT wrote: Reason for CRM: Patient says she received message in my chart  to have her potassium levels rechecked within  3 weeks and wants to make sure orders are are placed

## 2016-12-17 NOTE — Telephone Encounter (Signed)
See results notes.  Orders placed for potassium lab. Pt notified via Mychart Nothing further needed.

## 2016-12-20 ENCOUNTER — Encounter: Payer: Self-pay | Admitting: Internal Medicine

## 2016-12-20 ENCOUNTER — Encounter: Payer: Self-pay | Admitting: Rheumatology

## 2016-12-20 ENCOUNTER — Ambulatory Visit: Payer: PPO | Admitting: Rheumatology

## 2016-12-20 VITALS — BP 94/51 | HR 74 | Ht 64.0 in | Wt 130.0 lb

## 2016-12-20 DIAGNOSIS — R768 Other specified abnormal immunological findings in serum: Secondary | ICD-10-CM | POA: Diagnosis not present

## 2016-12-20 DIAGNOSIS — H8109 Meniere's disease, unspecified ear: Secondary | ICD-10-CM | POA: Diagnosis not present

## 2016-12-20 DIAGNOSIS — M51369 Other intervertebral disc degeneration, lumbar region without mention of lumbar back pain or lower extremity pain: Secondary | ICD-10-CM

## 2016-12-20 DIAGNOSIS — R7689 Other specified abnormal immunological findings in serum: Secondary | ICD-10-CM

## 2016-12-20 DIAGNOSIS — Z148 Genetic carrier of other disease: Secondary | ICD-10-CM | POA: Diagnosis not present

## 2016-12-20 DIAGNOSIS — R945 Abnormal results of liver function studies: Secondary | ICD-10-CM

## 2016-12-20 DIAGNOSIS — M19042 Primary osteoarthritis, left hand: Secondary | ICD-10-CM

## 2016-12-20 DIAGNOSIS — R7989 Other specified abnormal findings of blood chemistry: Secondary | ICD-10-CM

## 2016-12-20 DIAGNOSIS — M5136 Other intervertebral disc degeneration, lumbar region: Secondary | ICD-10-CM | POA: Diagnosis not present

## 2016-12-20 DIAGNOSIS — E559 Vitamin D deficiency, unspecified: Secondary | ICD-10-CM

## 2016-12-20 DIAGNOSIS — M5030 Other cervical disc degeneration, unspecified cervical region: Secondary | ICD-10-CM

## 2016-12-20 DIAGNOSIS — M19041 Primary osteoarthritis, right hand: Secondary | ICD-10-CM | POA: Diagnosis not present

## 2016-12-20 DIAGNOSIS — M810 Age-related osteoporosis without current pathological fracture: Secondary | ICD-10-CM

## 2016-12-20 NOTE — Patient Instructions (Signed)
Natural anti-inflammatories  You can purchase these at [employer], [employer] or online.  . Turmeric (capsules)  . Ginger (ginger root or capsules)  . Omega 3 (Fish, flax seeds, chia seeds, walnuts, almonds)  . Tart cherry (dried or extract)   Patient should be under the care of a physician while taking these supplements. This may not be reproduced without the permission of Dr. Chaz Mcglasson.  

## 2016-12-23 ENCOUNTER — Telehealth: Payer: Self-pay | Admitting: *Deleted

## 2016-12-23 NOTE — Telephone Encounter (Signed)
Prior auth sent to [email or web address] for Zolpidem 5mg [id number].

## 2016-12-24 ENCOUNTER — Encounter: Payer: Self-pay | Admitting: Internal Medicine

## 2016-12-24 NOTE — Telephone Encounter (Signed)
Vit d 68 is not low but   But sometimes  Want higher for medical reasons .  Please add on vit d level to   Regular labs ( to get lfts )   Dx lvit d deficiency .  Also the fatty liver  Was listed because the previous doctor  before you came here said fatty liver .  However note that   Comment said  bx did not show fatty liver .      Will take out  Phlebitis hx   anc give more info that  No fatty lvier on bx   (Superficial phelbitis is a minor problem and poss you could have had that way back when .Marland Kitchen But not an important finding anyway)

## 2016-12-25 NOTE — Telephone Encounter (Signed)
Vit D level cannot be added on to 12/11/16 labs as they are too old.  New order placed and patient made aware via mychart message that an appt is needed.  Nothing further needed.

## 2016-12-31 ENCOUNTER — Encounter: Payer: Self-pay | Admitting: Internal Medicine

## 2017-01-07 ENCOUNTER — Telehealth: Payer: Self-pay

## 2017-01-07 NOTE — Telephone Encounter (Signed)
No she has not had any prolia injections

## 2017-01-07 NOTE — Telephone Encounter (Signed)
Donna Warren, she did not get any Prolia inj's yet, right?

## 2017-01-07 NOTE — Telephone Encounter (Signed)
Just an FYI patient has declined prolia I spoke to her today and she stated she does not want to take prolia at this time due to fer of the side affects

## 2017-01-08 NOTE — Telephone Encounter (Signed)
Oh, all right, then. Thank you!

## 2017-01-08 NOTE — Telephone Encounter (Signed)
Ambien approved from 12/23/16 through 01/20/18

## 2017-02-01 ENCOUNTER — Encounter: Payer: Self-pay | Admitting: Internal Medicine

## 2017-02-01 ENCOUNTER — Ambulatory Visit: Payer: PPO | Admitting: Internal Medicine

## 2017-02-01 VITALS — BP 110/72 | HR 78 | Temp 98.9°F | Resp 14 | Ht 64.0 in | Wt 131.0 lb

## 2017-02-01 DIAGNOSIS — J029 Acute pharyngitis, unspecified: Secondary | ICD-10-CM | POA: Diagnosis not present

## 2017-02-01 DIAGNOSIS — J069 Acute upper respiratory infection, unspecified: Secondary | ICD-10-CM | POA: Diagnosis not present

## 2017-02-01 LAB — POCT RAPID STREP A (OFFICE): RAPID STREP A SCREEN: NEGATIVE

## 2017-02-01 MED ORDER — METHYLPREDNISOLONE ACETATE 80 MG/ML IJ SUSP
80.0000 mg | Freq: Once | INTRAMUSCULAR | Status: AC
  Administered 2017-02-01: 80 mg via INTRAMUSCULAR

## 2017-02-01 NOTE — Patient Instructions (Signed)

## 2017-02-01 NOTE — Progress Notes (Signed)
Subjective:    Patient ID: Donna Warren, female    DOB: 12-20-1950, 67 y.o.   MRN: [id number]  HPI  Pt presents to the clinic today with c/o sneezing, nasal congestion and sore throat. This started 1 week ago. She is not blowing anything out of her nose. She is having some mild difficulty swallowing. She has run fevers < 100.0. She denies chills or body aches. She has taken Ibuprofen with some relief. She has had sick contacts diagnosed with strep.   Review of Systems      Past Medical History:  Diagnosis Date  . 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 Femur fracture, right ([place]) 2003  . Fibroids   . Gilbert's syndrome   . History of liver biopsy nl    records of fatty liver 2008 not confirmed   . Hx of adenomatous colonic polyps   . Meniere disease    Right ear  . Osteoporosis   . Osteoporosis   . Other hemochromatosis    heterozygosity for h63d gene  . Spontaneous abortion     Current Outpatient Medications  Medication Sig Dispense Refill  . Calcium Carb-Cholecalciferol (CALCIUM 600/VITAMIN D3) 600-800 MG-UNIT TABS Take 1 tablet by mouth daily.    . chlorthalidone (HYGROTON) 25 MG tablet Take 25 mg by mouth daily.    . cholecalciferol (VITAMIN D) 1000 units tablet Take 1 tablet by mouth daily.     No current facility-administered medications for this visit.     Allergies  Allergen Reactions  . Penicillins     rash    Family History  Problem Relation Age of Onset  . Colon cancer Father   . Other Father        tinnitus  . Depression Mother   . Anxiety disorder Mother   . Dementia Mother   . Other Mother        high calcium  . Hearing loss Mother        death  . Stroke Unknown        silent  . Liver disease Brother        elevated lft. Unknown if he actually has liver disease  . Other Brother        H63D hemochromatosis gene  . Other Daughter        H63d hemochromatosis      Social History   Socioeconomic History  . Marital status: Married    Spouse name: Not on file  . Number of children: Not on file  . Years of education: Not on file  . Highest education level: Not on file  Social Needs  . Financial resource strain: Not on file  . Food insecurity - worry: Not on file  . Food insecurity - inability: Not on file  . Transportation needs - medical: Not on file  . Transportation needs - non-medical: Not on file  Occupational History  . Not on file  Tobacco Use  . Smoking status: Never Smoker  . Smokeless tobacco: Never Used  Substance and Sexual Activity  . Alcohol use: Yes    Alcohol/week: 0.6 - 1.2 oz    Types: 1 - 2 Glasses of wine per week  . Drug use: No  . Sexual activity: Not on file  Other Topics Concern  . 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  Travels    Daily caffeine use   Sleep  Often disrupted.    Alcohol about once a week.   Hof 2 pet dogs     Constitutional: Pt reports fever. Denies malaise, fatigue, headache or abrupt weight changes.  HEENT: Pt reports nasal congestion and sore throat. Denies eye pain, eye redness, ear pain, ringing in the ears, wax buildup, runny nose, bloody nose. Respiratory: Denies difficulty breathing, shortness of breath, cough or sputum production.    No other specific complaints in a complete review of systems (except as listed in HPI above).  Objective:   Physical Exam  BP 110/72 (BP Location: Left Arm, Patient Position: Sitting, Cuff Size: Normal)   Pulse 78   Temp 98.9 F (37.2 C) (Oral)   Resp 14   Ht 5\' 4"  (1.626 m)   Wt 131 lb (59.4 kg)   SpO2 98%   BMI 22.49 kg/m  Wt Readings from Last 3 Encounters:  02/01/17 131 lb (59.4 kg)  12/20/16 130 lb (59 kg)  12/11/16 127 lb 9.6 oz (57.9 kg)    General: Appears her stated age, in NAD. Skin: Warm, dry and intact. HEENT: Head:  normal shape and size, no sinus tenderness noted; Ears: Tm's gray and intact, normal light reflex; Nose: mucosa pink and moist, septum midline; Throat/Mouth: Teeth present, mucosa erythematous and moist, no exudate, lesions or ulcerations noted.  Neck:  Bilateral anterior cervical adenopathy noted..  Pulmonary/Chest: Normal effort and positive vesicular breath sounds. No respiratory distress. No wheezes, rales or ronchi noted.    BMET    Component Value Date/Time   NA 139 12/11/2016 1147   K 3.3 (L) 12/11/2016 1147   CL 99 12/11/2016 1147   CO2 33 (H) 12/11/2016 1147   GLUCOSE 87 12/11/2016 1147   BUN 24 (H) 12/11/2016 1147   CREATININE 0.87 12/11/2016 1147   CREATININE 1.05 (H) 05/10/2016 0918   CALCIUM 9.6 12/11/2016 1147   CALCIUM 10.1 10/14/2011 0000   GFRNONAA 56 (L) 05/10/2016 0918   GFRAA 64 05/10/2016 0918    Lipid Panel     Component Value Date/Time   CHOL 217 (H) 12/11/2016 1147   TRIG 62.0 12/11/2016 1147   HDL 80.50 12/11/2016 1147   CHOLHDL 3 12/11/2016 1147   VLDL 12.4 12/11/2016 1147   LDLCALC 124 (H) 12/11/2016 1147    CBC    Component Value Date/Time   WBC 4.1 12/11/2016 1147   RBC 3.60 (L) 12/11/2016 1147   HGB 11.9 (L) 12/11/2016 1147   HCT 34.2 (L) 12/11/2016 1147   PLT 237.0 12/11/2016 1147   MCV 95.1 12/11/2016 1147   MCH 32.1 06/23/2013 0810   MCHC 34.7 12/11/2016 1147   RDW 13.0 12/11/2016 1147   LYMPHSABS 1.8 12/11/2016 1147   MONOABS 0.3 12/11/2016 1147   EOSABS 0.1 12/11/2016 1147   BASOSABS 0.0 12/11/2016 1147    Hgb A1C Lab Results  Component Value Date   HGBA1C 5.1 09/21/2008             Assessment & Plan:   Viral URI, Sore Throat:  RST: negative 80 mg Depo IM Continue Ibuprofen and salt water gargles You may want to try some Zyrtec OTC  Return precautions discussed Webb Silversmith, NP

## 2017-02-01 NOTE — Progress Notes (Signed)
Pre visit review using our clinic review tool, if applicable. No additional management support is needed unless otherwise documented below in the visit note. 

## 2017-02-28 ENCOUNTER — Other Ambulatory Visit (INDEPENDENT_AMBULATORY_CARE_PROVIDER_SITE_OTHER): Payer: PPO

## 2017-02-28 DIAGNOSIS — R945 Abnormal results of liver function studies: Secondary | ICD-10-CM

## 2017-02-28 DIAGNOSIS — E876 Hypokalemia: Secondary | ICD-10-CM | POA: Diagnosis not present

## 2017-02-28 DIAGNOSIS — R7989 Other specified abnormal findings of blood chemistry: Secondary | ICD-10-CM

## 2017-02-28 LAB — POTASSIUM: Potassium: 3.8 mEq/L (ref 3.5–5.1)

## 2017-02-28 LAB — VITAMIN D 25 HYDROXY (VIT D DEFICIENCY, FRACTURES): VITD: 39.58 ng/mL (ref 30.00–100.00)

## 2017-02-28 LAB — HEPATIC FUNCTION PANEL
ALK PHOS: 68 U/L (ref 39–117)
ALT: 27 U/L (ref 0–35)
AST: 23 U/L (ref 0–37)
Albumin: 4.5 g/dL (ref 3.5–5.2)
BILIRUBIN DIRECT: 0.3 mg/dL (ref 0.0–0.3)
Total Bilirubin: 1.8 mg/dL — ABNORMAL HIGH (ref 0.2–1.2)
Total Protein: 7.4 g/dL (ref 6.0–8.3)

## 2017-03-19 DIAGNOSIS — H9311 Tinnitus, right ear: Secondary | ICD-10-CM | POA: Diagnosis not present

## 2017-03-19 DIAGNOSIS — H8101 Meniere's disease, right ear: Secondary | ICD-10-CM | POA: Diagnosis not present

## 2017-03-19 DIAGNOSIS — H90A21 Sensorineural hearing loss, unilateral, right ear, with restricted hearing on the contralateral side: Secondary | ICD-10-CM | POA: Diagnosis not present

## 2017-04-16 ENCOUNTER — Encounter: Payer: Self-pay | Admitting: Rheumatology

## 2017-05-05 NOTE — Progress Notes (Signed)
Office Visit Note  Patient: Donna Warren             Date of Birth: Dec 26, 1950           MRN: [id number]             PCP: Burnis Medin, MD Referring: Burnis Medin, MD Visit Date: 05/06/2017 Occupation: @GUAROCC @    Subjective:  Right elbow pain   History of Present Illness: Donna Warren is a 67 y.o. female with history of osteoarthritis, DDD, and osteoporosis.  Patient states that she has been having right elbow pain for the past 6 weeks.  She states that the pain alternates from a sharp pain to a dull ache.  She states that she has radiation of pain to the biceps region.  She denies any injuries or overuse activities.  She denies any mechanical symptoms.  She states she has been having pain with daily activities.  She denies using any ice.  She states that she has been using ibuprofen as needed for pain relief.  She denies any pain in her hands at this time.  She denies any swelling.  She states that her neck and lower back are stable.  She states she has occasional discomfort and stiffness.  She uses heat therapy.  She states that about 6 weeks ago she developed right-sided pain.  She denies any groin pain.  She states she walked about 2 miles that day and developed pain.    Activities of Daily Living:  Patient reports morning stiffness for 2 minutes.   Patient Reports nocturnal pain.  Difficulty dressing/grooming: Denies Difficulty climbing stairs: Denies Difficulty getting out of chair: Denies Difficulty using hands for taps, buttons, cutlery, and/or writing: Denies   Review of Systems  Constitutional: Negative for fatigue.  HENT: Negative for mouth sores, mouth dryness and nose dryness.   Eyes: Negative for pain, visual disturbance and dryness.  Respiratory: Negative for cough, hemoptysis, shortness of breath and difficulty breathing.   Cardiovascular: Negative for chest pain, palpitations, hypertension and swelling in legs/feet.  Gastrointestinal: Negative for blood in  stool, constipation and diarrhea.  Endocrine: Negative for increased urination.  Genitourinary: Negative for painful urination.  Musculoskeletal: Positive for arthralgias and joint pain. Negative for joint swelling, myalgias, muscle weakness, morning stiffness, muscle tenderness and myalgias.  Skin: Negative for color change, pallor, rash, hair loss, nodules/bumps, skin tightness, ulcers and sensitivity to sunlight.  Allergic/Immunologic: Negative for susceptible to infections.  Neurological: Negative for dizziness, numbness, headaches and weakness.  Hematological: Negative for swollen glands.  Psychiatric/Behavioral: Negative for depressed mood and sleep disturbance. The patient is not nervous/anxious.     PMFS History:  Patient Active Problem List   Diagnosis Date Noted  . 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 Primary osteoarthritis of both hands 06/05/2016  . DJD (degenerative joint disease), cervical 05/08/2016  . Female genital lesion 08/12/2014  . Osteoporosis 07/20/2014  . Elevated LFTs 05/21/2013  . 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 Meniere's disease 10/14/2011  . Skin lesion 08/14/2011  . Right shoulder strain 08/13/2011  . Phlebitis, superficial 08/16/2010  . DECREASED HEARING, RIGHT EAR 11/08/2009  . PROBLEMS WITH HEARING 11/08/2009  . DYSLIPIDEMIA 10/07/2008  . Gilbert syndrome 10/26/2007  . FIBROIDS, UTERUS 05/29/2007  . LIVER HEMANGIOMA 05/29/2007  . COLONIC POLYPS, HX OF 05/29/2007    Past Medical History:  Diagnosis Date  . Abnormal laboratory test result elevated  alpha feto protein  . 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 Femur fracture, right ([place]) 2003  . Fibroids   . Gilbert's syndrome   . History of liver biopsy nl    records of fatty liver 2008 not confirmed   . Hx of adenomatous colonic polyps   . Meniere disease    Right  ear  . Osteoporosis   . Osteoporosis   . Other hemochromatosis    heterozygosity for h63d gene  . Spontaneous abortion     Family History  Problem Relation Age of Onset  . Colon cancer Father   . Other Father        tinnitus  . Heart attack Father   . Depression Mother   . Anxiety disorder Mother   . Dementia Mother   . Other Mother        high calcium  . Hearing loss Mother        death  . Pulmonary disease Mother   . Stroke Unknown        silent  . Other Brother        elevated LFTs  . Healthy Daughter   . Healthy Daughter    Past Surgical History:  Procedure Laterality Date  . BREAST CYST EXCISION Right   . [place]  . COLONOSCOPY  220, 2004, 2007, 08/13/2010   2002: 8 mm polyp 2004: no polyps 2007: no polyps 2012: hemorrhoids  . hosp r/o mi cv right chest and jaw pain  2010  . LIVER BIOPSY     2015 negative   no fatty liver  records  2008 said fatty liver    . OVARIAN CYST REMOVAL    . spontaneously abortion on 3rd preg     triploid dna on path   Social History   Social History Narrative        2 daughters   Husband a [employer] director  Working 57 - 48.  Travels    Daily caffeine use   Sleep  Often disrupted.    Alcohol about once a week.   Hof 2 pet dogs     Objective: Vital Signs: BP 105/61 (BP Location: Left Arm, Patient Position: Sitting, Cuff Size: Normal)   Pulse 76   Resp 13   Ht 5\' 4"  (1.626 m)   Wt 133 lb (60.3 kg)   BMI 22.83 kg/m    Physical Exam  Constitutional: She is oriented to person, place, and time. She appears well-developed and well-nourished.  HENT:  Head: Normocephalic and atraumatic.  Eyes: Conjunctivae and EOM are normal.  Neck: Normal range of motion.  Cardiovascular: Normal rate, regular rhythm, normal heart sounds and intact distal pulses.  Pulmonary/Chest: Effort normal and breath sounds normal.  Abdominal: Soft. Bowel sounds are normal.  Lymphadenopathy:    She has  no cervical adenopathy.  Neurological: She is alert and oriented to person, place, and time.  Skin: Skin is warm and dry. Capillary refill takes less than 2 seconds.  Psychiatric: She has a normal mood and affect. Her behavior is normal.  Nursing note and vitals reviewed.    Musculoskeletal Exam: C-spine, thoracic, and lumbar spine good range of motion.  No midline spinal tenderness.  No SI joint tenderness.  She has tenderness of the PSIS.  Shoulder joints, elbow joints, wrist joints, MCPs, PIPs, DIPs good range of motion with no synovitis.  She has mild tenderness over the right lateral  epicondyle.  Hip joints, knee joints, ankle joints, MTPs, PIPs, DIPs good range of motion with no synovitis.  No warmth or effusion of bilateral knees.  No tenderness of trochanteric bursa.    CDAI Exam: No CDAI exam completed.    Investigation: No additional findings.   Imaging: Xr Elbow 2 Views Right  Result Date: 05/06/2017 No joint space narrowing.  No erosions noted.  Impression: Unremarkable x-ray of the right elbow.    Speciality Comments: No specialty comments available.    Procedures:  No procedures performed Allergies: Penicillins   Assessment / Plan:     Visit Diagnoses: Primary osteoarthritis of both hands: She has PIP and DIP synovial thickening consistent with osteoarthritis.  Joint protection and muscle strengthening were discussed.    DDD (degenerative disc disease), cervical: She good ROM on exam.  She has occasional stiffness and discomfort.    DDD (degenerative disc disease), lumbar: No midline spinal tenderness.   Age-related osteoporosis without current pathological fracture - August 2018 T score -2.5 with change of -1.0% compared to the bone density of 2016  Pain in right elbow - She has tenderness over the right lateral epicondyle.  She requested a x-ray of right elbow today.  X-ray obtained today was unremarkable.  She was given a handout of forearm and elbow exercises  that she can perform at home.  She was advised to ice and rest her elbow.  Plan: XR Elbow 2 Views Right   Other medical conditions are listed as follows:   Positive ANA (antinuclear antibody)  Hemochromatosis carrier  Elevated LFTs - Patient reports that her liver biopsy was negative in the past.  Rosanna Randy syndrome  Meniere's disease, unspecified laterality     Orders: Orders Placed This Encounter  Procedures  . XR Elbow 2 Views Right   Meds ordered this encounter  Medications  . diclofenac sodium (VOLTAREN) 1 % GEL    Sig: Apply 3 g to 3 large joints up to 3 times a day.    Dispense:  3 Tube    Refill:  3    Face-to-face time spent with patient was 30 minutes. >50% of time was spent in counseling and coordination of care.  Follow-Up Instructions: Return in about 6 months (around 11/05/2017) for Osteoarthritis, DDD, Osteoporosis.   Ofilia Neas, PA-C  Note - This record has been created using Dragon software.  Chart creation errors have been sought, but may not always  have been located. Such creation errors do not reflect on  the standard of medical care.

## 2017-05-06 ENCOUNTER — Ambulatory Visit: Payer: PPO | Admitting: Rheumatology

## 2017-05-06 ENCOUNTER — Ambulatory Visit (INDEPENDENT_AMBULATORY_CARE_PROVIDER_SITE_OTHER): Payer: Self-pay

## 2017-05-06 ENCOUNTER — Encounter: Payer: Self-pay | Admitting: Rheumatology

## 2017-05-06 VITALS — BP 105/61 | HR 76 | Resp 13 | Ht 64.0 in | Wt 133.0 lb

## 2017-05-06 DIAGNOSIS — M19042 Primary osteoarthritis, left hand: Secondary | ICD-10-CM

## 2017-05-06 DIAGNOSIS — H8109 Meniere's disease, unspecified ear: Secondary | ICD-10-CM | POA: Diagnosis not present

## 2017-05-06 DIAGNOSIS — M5030 Other cervical disc degeneration, unspecified cervical region: Secondary | ICD-10-CM | POA: Diagnosis not present

## 2017-05-06 DIAGNOSIS — R945 Abnormal results of liver function studies: Secondary | ICD-10-CM | POA: Diagnosis not present

## 2017-05-06 DIAGNOSIS — M5136 Other intervertebral disc degeneration, lumbar region: Secondary | ICD-10-CM | POA: Diagnosis not present

## 2017-05-06 DIAGNOSIS — R7989 Other specified abnormal findings of blood chemistry: Secondary | ICD-10-CM

## 2017-05-06 DIAGNOSIS — M25521 Pain in right elbow: Secondary | ICD-10-CM

## 2017-05-06 DIAGNOSIS — M810 Age-related osteoporosis without current pathological fracture: Secondary | ICD-10-CM

## 2017-05-06 DIAGNOSIS — M19041 Primary osteoarthritis, right hand: Secondary | ICD-10-CM

## 2017-05-06 DIAGNOSIS — R768 Other specified abnormal immunological findings in serum: Secondary | ICD-10-CM

## 2017-05-06 DIAGNOSIS — Z148 Genetic carrier of other disease: Secondary | ICD-10-CM | POA: Diagnosis not present

## 2017-05-06 MED ORDER — DICLOFENAC SODIUM 1 % TD GEL: TRANSDERMAL | 3 refills | Status: DC

## 2017-05-06 NOTE — Patient Instructions (Addendum)
Elbow and Forearm Exercises Ask your health care provider which exercises are safe for you. Do exercises exactly as told by your health care provider and adjust them as directed. It is normal to feel mild stretching, pulling, tightness, or discomfort as you do these exercises, but you should stop right away if you feel sudden pain or your pain gets worse.Do not begin these exercises until told by your health care provider. RANGE OF MOTION EXERCISES These exercises warm up your muscles and joints and improve the movement and flexibility of your injured elbow and forearm. These exercises also help to relieve pain, numbness, and tingling.These exercises are done using the muscles in your injured elbow and forearm. Exercise A: Elbow Flexion, Active 1. Hold your left / right arm at your side, and bend your elbow as far as you can using your left / right arm muscles. 2. Hold this position for __________ seconds. 3. Slowly return to the starting position. Repeat __________ times. Complete this exercise __________ times a day. Exercise B: Elbow Extension, Active 1. Hold your left / right arm at your side, and straighten your elbow as much as you can using your left / right arm muscles. 2. Hold this position for __________ seconds. 3. Slowly return to the starting position. Repeat __________ times. Complete this exercise __________ times a day. Exercise C: Forearm Rotation, Supination, Active 1. Stand or sit with your elbows at your sides. 2. Bend your left / right elbow to an "L" shape (90 degrees). 3. Turn your palm upward until you feel a gentle stretch on the inside of your forearm. 4. Hold this position for __________ seconds. 5. Slowly release and return to the starting position. Repeat __________ times. Complete this exercise __________ times a day. Exercise D: Forearm Rotation, Pronation, Active 1. Stand or sit with your elbows at your side. 2. Bend your left / right elbow to an "L" shape (90  degrees). 3. Turn your left / right palm downward until you feel a gentle stretch on the top of your forearm. 4. Hold this position for __________ seconds. 5. Slowly release and return to the starting position. Repeat__________ times. Complete this exercise __________ times a day. STRETCHING EXERCISES These exercises warm up your muscles and joints and improve the movement and flexibility of your injured elbow and forearm. These exercises also help to relieve pain, numbness, and tingling.These exercises are done using your healthy elbow and forearm to help stretch the muscles in your injured elbow and forearm. Exercise E: Elbow Flexion, Active-Assisted  1. Hold your left / right arm at your side, and bend your elbow as much as you can using your left / right arm muscles. 2. Use your other hand to bend your left / right elbow farther. To do this, gently push up on your forearm until you feel a gentle stretch on the back of your elbow. 3. Hold this position for __________ seconds. 4. Slowly return to the starting position. Repeat __________ times. Complete this exercise __________ times a day. Exercise F: Elbow Extension, Active-Assisted  1. Hold your left / right arm at your side, and straighten your elbow as much as you can using your left / right arm muscles. 2. Use your other hand to straighten the left / right elbow farther. To do this, gently push down on your forearm until you feel a gentle stretch on the inside of your elbow. 3. Hold this position for __________ seconds. 4. Slowly return to the starting position. Repeat __________  times. Complete this exercise __________ times a day. Exercise G: Forearm Rotation, Supination, Active-Assisted  1. Sit with your left / right elbow bent in an "L" shape (90 degrees) with your forearm resting on a table. 2. Keeping your upper body and shoulder still, rotate your forearm so your left / right palm faces upward. 3. Use your other hand to help  rotate your forearm further until you feel a gentle to moderate stretch. 4. Hold this position for __________ seconds. 5. Slowly release the stretch and return to the starting position. Repeat __________ times. Complete this exercise __________ times a day. Exercise H: Forearm Rotation, Pronation, Active-Assisted  1. Sit with your left / right elbow bent in an "L" shape (90 degrees) with your forearm resting on a table. 2. Keeping your upper body and shoulder still, rotate your forearm so your palm faces the tabletop. 3. Use your other hand to help rotate your forearm further until you feel a gentle to moderate stretch. 4. Hold this position for __________ seconds. 5. Slowly release the stretch and return to the starting position. Repeat __________ times. Complete this exercise __________ times a day. Exercise I: Elbow Flexion, Supine, Passive 1. Lie on your back. 2. Extend your left / right arm up in the air, bracing it with your other hand. 3. Let your left / right your hand slowly lower toward your shoulder, while your elbow stays pointed toward the ceiling. You should feel a gentle stretch along the back of your upper arm and elbow. 4. If instructed by your health care provider, you may increase the intensity of your stretch by adding a small wrist weight or hand weight. 5. Hold this position for __________ seconds. 6. Slowly return to the starting position. Repeat __________ times. Complete this exercise __________ times a day. Exercise J: Elbow Extension, Supine, Passive  1. Lie on your back. Make sure that you are in a comfortable position that lets you relax your arm muscles. 2. Place a folded towel under your left / right upper arm so your elbow and shoulder are at the same height. Straighten your left / right arm so your elbow does not rest on the bed or towel. 3. Let the weight of your hand stretch your elbow. Keep your arm and chest muscles relaxed. You should feel a stretch on  the inside of your elbow. 4. If told by your health care provider, you may increase the intensity of your stretch by adding a small wrist weight or hand weight. 5. Hold this position for__________ seconds. 6. Slowly release the stretch. Repeat __________ times. Complete this exercise __________ times a day. STRENGTHENING EXERCISES These exercises build strength and endurance in your elbow and forearm. Endurance is the ability to use your muscles for a long time, even after they get tired. Exercise K: Elbow Flexion, Isometric  1. Stand or sit up straight. 2. Bend your left / right elbow in an "L" shape (90 degrees) and turn your palm up so your forearm is at the height of your waist. 3. Place your other hand on top of your forearm. Gently push down as your left / right arm resists. Push as hard as you can with both arms without causing any pain or movement at your left / right elbow. 4. Hold this position for __________ seconds. 5. Slowly release the tension in both arms. Let your muscles relax completely before repeating. Repeat __________ times. Complete this exercise __________ times a day. Exercise L: Elbow Extensors, Isometric  1. Stand or sit up straight. 2. Place your left / right arm so your palm faces your abdomen and it is at the height of your waist. 3. Place your other hand on the underside of your forearm. Gently push up as your left / right arm resists. Push as hard as you can with both arms, without causing any pain or movement at your left / right elbow. 4. Hold this position for __________ seconds. 5. Slowly release the tension in both arms. Let your muscles relax completely before repeating. Repeat __________ times. Complete this exercise __________ times a day. Exercise M: Elbow Flexion With Forearm Palm Up  1. Sit upright on a firm chair without armrests, or stand. 2. Place your left / right arm at your side with your palm facing forward. 3. Holding a __________weight  or gripping a rubber exercise band or tubing, bend your elbow to bring your hand toward your shoulder. 4. Hold this position for __________ seconds. 5. Slowly return to the starting position. Repeat __________times. Complete this exercise __________times a day. Exercise N: Elbow Extension  1. Sit on a firm chair without armrests, or stand. 2. Keeping your upper arms at your sides, bring both hands up toward your left / right shoulder while you grip a rubber exercise band or tubing. Your left / right hand should be just below the other hand. 3. Straighten your left / right elbow. 4. Hold this position for __________ seconds. 5. Control the resistance of the band or tubing as your hand returns to your side. Repeat __________times. Complete this exercise __________times a day. Exercise O: Forearm Rotation, Supination  1. Sit with your left / right forearm supported on a table. Keep your elbow at waist height. 2. Rest your hand over the edge of the table with your palm facing down. 3. Gently hold a lightweight hammer. 4. Without moving your elbow, slowly rotate your forearm to turn your palm and hand upward to a "thumbs-up" position. 5. Hold this position for __________ seconds. 6. Slowly return to the starting position. Repeat __________times. Complete this exercise __________times a day. Exercise P: Forearm Rotation, Pronation  1. Sit with your left / right forearm supported on a table. Keep your elbow below shoulder height. 2. Rest your hand over the edge of the table with your palm facing up. 3. Gently hold a lightweight hammer. 4. Without moving your elbow, slowly rotate your forearm to turn your palm and hand upward to a "thumbs-up" position. 5. Hold this position for __________seconds. 6. Slowly return to the starting position. Repeat __________times. Complete this exercise __________times a day. This information is not intended to replace advice given to you by your health care  provider. Make sure you discuss any questions you have with your health care provider. Document Released: 11/21/2004 Document Revised: 05/18/2015 Document Reviewed: 10/02/2014 Elsevier Interactive Patient Education  2018 [employer]  

## 2017-05-08 ENCOUNTER — Ambulatory Visit: Payer: PPO | Admitting: Internal Medicine

## 2017-07-29 ENCOUNTER — Other Ambulatory Visit: Payer: Self-pay | Admitting: Internal Medicine

## 2017-07-29 DIAGNOSIS — Z1231 Encounter for screening mammogram for malignant neoplasm of breast: Secondary | ICD-10-CM

## 2017-08-01 ENCOUNTER — Ambulatory Visit
Admission: RE | Admit: 2017-08-01 | Discharge: 2017-08-01 | Disposition: A | Discharge status: Home / Self Care | Payer: PPO | Source: Ambulatory Visit | Attending: Internal Medicine | Admitting: Internal Medicine

## 2017-08-01 DIAGNOSIS — Z1231 Encounter for screening mammogram for malignant neoplasm of breast: Secondary | ICD-10-CM | POA: Diagnosis not present

## 2017-09-12 ENCOUNTER — Other Ambulatory Visit: Payer: Self-pay | Admitting: Internal Medicine

## 2017-09-12 DIAGNOSIS — R945 Abnormal results of liver function studies: Secondary | ICD-10-CM

## 2017-09-12 DIAGNOSIS — D649 Anemia, unspecified: Secondary | ICD-10-CM

## 2017-09-12 DIAGNOSIS — E785 Hyperlipidemia, unspecified: Secondary | ICD-10-CM

## 2017-09-12 DIAGNOSIS — Z79899 Other long term (current) drug therapy: Secondary | ICD-10-CM

## 2017-09-12 DIAGNOSIS — R7989 Other specified abnormal findings of blood chemistry: Secondary | ICD-10-CM

## 2017-10-12 NOTE — Progress Notes (Addendum)
Subjective:   Donna Warren is a 67 y.o. female who presents for Medicare Annual (Subsequent) preventive examination.  Reports health as good  Chol/hdl 3; HDL 80; Trig 62 Last visit was 11/2016 with Dr. 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 COLONOSCOPY  08/12/2017   Mammogram 07/2017 - annually   Dexa 07/2016 -3.1 Dr. Regis Bill has been discussing for along time  Dr. Cruzita Lederer - she recommended prolia -  Introduced to osteoporosis foundation online for more in depth patient information   Has had her shingrix; tough vaccine but overall did well   Discussed tdap if she updates her tetanus at the pharmacy   Will  take the high dose today but clinic did not have these available. Will take prior to flu season. She can make a nurse visit or take at the pharmacy   Cardiac Risk Factors include: advanced age (>50men, >61 women)     Objective:     Vitals: BP 92/60   Pulse (!) 58   Ht 5\' 4"  (1.626 m)   Wt 131 lb (59.4 kg)   SpO2 99%   BMI 22.49 kg/m   Body mass index is 22.49 kg/m.  Advanced Directives 10/13/2017  Does Patient Have a Medical Advance Directive? No    Tobacco Social History   Tobacco Use  Smoking Status Never Smoker  Smokeless Tobacco Never Used     Counseling given: Yes   Clinical Intake:   Past Medical History:  Diagnosis Date  . 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 Femur fracture, right ([place]) 2003  . Fibroids   . Gilbert's syndrome   . History of liver biopsy nl      records of fatty liver 2008 not confirmed   . Hx of adenomatous colonic polyps   . Meniere disease    Right ear  . Osteoporosis   . Osteoporosis   . Other hemochromatosis    heterozygosity for h63d gene  . Spontaneous abortion    Past Surgical History:  Procedure Laterality Date  . BREAST CYST EXCISION Right   . [place]  . COLONOSCOPY  220, 2004, 2007, 08/13/2010   2002: 8 mm polyp 2004: no polyps 2007: no polyps 2012: hemorrhoids  . hosp r/o mi cv right chest and jaw pain  2010  . LIVER BIOPSY     2015 negative   no fatty liver  records  2008 said fatty liver    . OVARIAN CYST REMOVAL    . spontaneously abortion on 3rd preg     triploid dna on path   Family History  Problem Relation Age of Onset  . Colon cancer Father   . Other Father        tinnitus  . Heart attack Father   . Depression Mother   . Anxiety disorder Mother   . Dementia Mother   . Other Mother        high calcium  . Hearing  loss Mother        death  . Pulmonary disease Mother   . Stroke Unknown        silent  . Other Brother        elevated LFTs  . Healthy Daughter   . Healthy Daughter    Social History   Socioeconomic History  . Marital status: Married    Spouse name: Not on file  . Number of children: Not on file  . Years of education: Not on file  . Highest education level: Not on file  Occupational History  . Not on file  Social Needs  . Financial resource strain: Not on file  . Food insecurity:    Worry: Not on file    Inability: Not on file  . Transportation needs:    Medical: Not on file    Non-medical: Not on file  Tobacco Use  . Smoking status: Never Smoker  . Smokeless tobacco: Never Used  Substance and Sexual Activity  . Alcohol use: Yes    Alcohol/week: 1.0 - 2.0 standard drinks    Types: 1 - 2 Glasses of wine per week    Comment: glass of wine a week  . Drug use: No  . Sexual activity: Not on file  Lifestyle  . Physical activity:    Days per week:  Not on file    Minutes per session: Not on file  . Stress: Not on file  Relationships  . Social connections:    Talks on phone: Not on file    Gets together: Not on file    Attends religious service: Not on file    Active member of club or organization: Not on file    Attends meetings of clubs or organizations: Not on file    Relationship status: Not on file  Other Topics Concern  . Not on file  Social History Narrative        2 daughters   Husband a [employer] director  Working 51 - 48.  Travels    Daily caffeine use   Sleep  Often disrupted.    Alcohol about once a week.   Hof 2 pet dogs    Outpatient Encounter Medications as of 10/13/2017  Medication Sig  . Calcium Carb-Cholecalciferol (CALCIUM 600/VITAMIN D3) 600-800 MG-UNIT TABS Take 1 tablet by mouth daily.  . chlorthalidone (HYGROTON) 25 MG tablet Take 25 mg by mouth daily.  . cholecalciferol (VITAMIN D) 1000 units tablet Take 1 tablet by mouth daily.  . diclofenac sodium (VOLTAREN) 1 % GEL Apply 3 g to 3 large joints up to 3 times a day.  . TURMERIC PO Take by mouth daily.   No facility-administered encounter medications on file as of 10/13/2017.     Activities of Daily Living In your present state of health, do you have any difficulty performing the following activities: 10/13/2017  Hearing? N  Vision? N  Difficulty concentrating or making decisions? N  Walking or climbing stairs? N  Dressing or bathing? N  Doing errands, shopping? N  Preparing Food and eating ? N  Using the Toilet? N  In the past six months, have you accidently leaked urine? N  Do you have problems with loss of bowel control? N  Managing your Medications? N  Managing your Finances? N  Housekeeping or managing your Housekeeping? N  Some recent data might be hidden    Patient Care Team: Panosh, Standley Brooking, MD as PCP -  General Gatha Mayer, MD (Gastroenterology) Jari Pigg, MD as Consulting Physician  (Dermatology) Vicie Mutters, MD as Consulting Physician (Otolaryngology)    Assessment:   This is a routine wellness examination for Felicite.  Exercise Activities and Dietary recommendations Current Exercise Habits: Home exercise routine, Type of exercise: walking, Time (Minutes): 30, Frequency (Times/Week): 4, Weekly Exercise (Minutes/Week): 120, Intensity: Moderate  Goals      Weight   . Weight (lb) < 127 lb (57.6 kg)     Try to cut back on sugar!        Fall Risk Fall Risk  10/13/2017 12/06/2015  Falls in the past year? No No     Depression Screen PHQ 2/9 Scores 10/13/2017 12/06/2015  PHQ - 2 Score 0 0     Cognitive Function MMSE - Mini Mental State Exam 10/13/2017  Not completed: (No Data)   Ad8 score reviewed for issues:  Issues making decisions:  Less interest in hobbies / activities:  Repeats questions, stories (family complaining):  Trouble using ordinary gadgets (microwave, computer, phone):  Forgets the month or year:   Mismanaging finances:   Remembering appts:  Daily problems with thinking and/or memory: Ad8 score is=0          Immunization History  Administered Date(s) Administered  . Influenza Split 10/20/2012, 10/12/2013, 10/12/2014  . Influenza, High Dose Seasonal PF 12/11/2016, 10/23/2017  . Pneumococcal Conjugate-13 12/06/2015  . Pneumococcal Polysaccharide-23 12/11/2016  . Td 06/19/2007  . Zoster Recombinat (Shingrix) 02/10/2017, 04/21/2017     Screening Tests Health Maintenance  Topic Date Due  . COLONOSCOPY  08/12/2017  . TETANUS/TDAP  10/14/2018 (Originally 06/18/2017)  . MAMMOGRAM  08/02/2019  . INFLUENZA VACCINE  Completed  . DEXA SCAN  Completed  . Hepatitis C Screening  Completed  . PNA vac Low Risk Adult  Completed        Plan:      PCP Notes   Health Maintenance She will reach out to GI to schedule her colonoscopy (Dr. Carlean Purl)   Mammogram 07/2017 - annually   Dexa 07/2016 -3.1 Dr. Regis Bill has been  discussing for along time  Dr. Cruzita Lederer - she recommended prolia -  Introduced to osteoporosis foundation online for more in depth patient information  Discussed starting light weights or yoga etc   Has had her shingrix; tough vaccine but overall did well   Discussed tdap if she updates her tetanus at the pharmacy   Will  take the high dose today but clinic did not have these available. Will take prior to flu season. She can make a nurse visit or take at the pharmacy    Abnormal Screens  none  Referrals  none  Patient concerns; Concerned with right elbow and wrist aches and hand gets cold  Will make a fup apt with Dr. Regis Bill to discuss   Nurse Concerns; As noted   Next PCP apt 09/23 at 1:45 for pain in elbow and other symptoms.  Has discussed with Dr. Estanislado Pandy- xray obtained but states it is bothersome.      I have personally reviewed and noted the following in the patient's chart:   . Medical and social history . Use of alcohol, tobacco or illicit drugs  . Current medications and supplements . Functional ability and status . Nutritional status . Physical activity . Advanced directives . List of other physicians . Hospitalizations, surgeries, and ER visits in previous 12 months . Vitals . Screenings to include cognitive, depression, and falls . Referrals and  appointments  In addition, I have reviewed and discussed with patient certain preventive protocols, quality metrics, and best practice recommendations. A written personalized care plan for preventive services as well as general preventive health recommendations were provided to patient.     Shanon Ace, MD  11/21/2017   10/17 to Dr. Regis Bill to review and sign note/ Sadao Weyer RN  Above noted reviewed and agree. Shanon Ace, MD

## 2017-10-13 ENCOUNTER — Encounter: Payer: Self-pay | Admitting: Internal Medicine

## 2017-10-13 ENCOUNTER — Ambulatory Visit (INDEPENDENT_AMBULATORY_CARE_PROVIDER_SITE_OTHER): Payer: PPO | Admitting: Internal Medicine

## 2017-10-13 ENCOUNTER — Ambulatory Visit (INDEPENDENT_AMBULATORY_CARE_PROVIDER_SITE_OTHER): Payer: PPO

## 2017-10-13 ENCOUNTER — Other Ambulatory Visit (INDEPENDENT_AMBULATORY_CARE_PROVIDER_SITE_OTHER): Payer: PPO

## 2017-10-13 VITALS — BP 102/58 | HR 58 | Wt 131.0 lb

## 2017-10-13 VITALS — BP 92/60 | HR 58 | Ht 64.0 in | Wt 131.0 lb

## 2017-10-13 DIAGNOSIS — Z0000 Encounter for general adult medical examination without abnormal findings: Secondary | ICD-10-CM | POA: Diagnosis not present

## 2017-10-13 DIAGNOSIS — E785 Hyperlipidemia, unspecified: Secondary | ICD-10-CM | POA: Diagnosis not present

## 2017-10-13 DIAGNOSIS — D649 Anemia, unspecified: Secondary | ICD-10-CM | POA: Diagnosis not present

## 2017-10-13 DIAGNOSIS — Z23 Encounter for immunization: Secondary | ICD-10-CM

## 2017-10-13 DIAGNOSIS — R7989 Other specified abnormal findings of blood chemistry: Secondary | ICD-10-CM

## 2017-10-13 DIAGNOSIS — M25521 Pain in right elbow: Secondary | ICD-10-CM

## 2017-10-13 DIAGNOSIS — E559 Vitamin D deficiency, unspecified: Secondary | ICD-10-CM | POA: Diagnosis not present

## 2017-10-13 DIAGNOSIS — R945 Abnormal results of liver function studies: Secondary | ICD-10-CM | POA: Diagnosis not present

## 2017-10-13 DIAGNOSIS — Z79899 Other long term (current) drug therapy: Secondary | ICD-10-CM

## 2017-10-13 LAB — CBC WITH DIFFERENTIAL/PLATELET
BASOS ABS: 0 10*3/uL (ref 0.0–0.1)
BASOS PCT: 0.6 % (ref 0.0–3.0)
EOS PCT: 2.9 % (ref 0.0–5.0)
Eosinophils Absolute: 0.1 10*3/uL (ref 0.0–0.7)
HEMATOCRIT: 35.8 % — AB (ref 36.0–46.0)
Hemoglobin: 12.6 g/dL (ref 12.0–15.0)
LYMPHS PCT: 38.7 % (ref 12.0–46.0)
Lymphs Abs: 1.4 10*3/uL (ref 0.7–4.0)
MCHC: 35.3 g/dL (ref 30.0–36.0)
MCV: 91.8 fl (ref 78.0–100.0)
MONOS PCT: 6.7 % (ref 3.0–12.0)
Monocytes Absolute: 0.2 10*3/uL (ref 0.1–1.0)
NEUTROS ABS: 1.8 10*3/uL (ref 1.4–7.7)
Neutrophils Relative %: 51.1 % (ref 43.0–77.0)
PLATELETS: 202 10*3/uL (ref 150.0–400.0)
RBC: 3.89 Mil/uL (ref 3.87–5.11)
RDW: 12.4 % (ref 11.5–15.5)
WBC: 3.5 10*3/uL — ABNORMAL LOW (ref 4.0–10.5)

## 2017-10-13 LAB — BASIC METABOLIC PANEL
BUN: 19 mg/dL (ref 6–23)
CALCIUM: 10.1 mg/dL (ref 8.4–10.5)
CHLORIDE: 100 meq/L (ref 96–112)
CO2: 34 meq/L — AB (ref 19–32)
Creatinine, Ser: 1.01 mg/dL (ref 0.40–1.20)
GFR: 58.13 mL/min — ABNORMAL LOW (ref >60.00)
Glucose, Bld: 91 mg/dL (ref 70–99)
POTASSIUM: 4.1 meq/L (ref 3.5–5.1)
SODIUM: 140 meq/L (ref 135–145)

## 2017-10-13 LAB — LIPID PANEL
Cholesterol: 206 mg/dL — ABNORMAL HIGH (ref 0–200)
HDL: 66.5 mg/dL (ref >39.00)
LDL Cholesterol: 123 mg/dL — ABNORMAL HIGH (ref 0–99)
NonHDL: 139.03
Total CHOL/HDL Ratio: 3
Triglycerides: 81 mg/dL (ref 0.0–149.0)
VLDL: 16.2 mg/dL (ref 0.0–40.0)

## 2017-10-13 LAB — HEPATIC FUNCTION PANEL
ALBUMIN: 4.4 g/dL (ref 3.5–5.2)
ALK PHOS: 60 U/L (ref 39–117)
ALT: 20 U/L (ref 0–35)
AST: 20 U/L (ref 0–37)
Bilirubin, Direct: 0.2 mg/dL (ref 0.0–0.3)
TOTAL PROTEIN: 7.1 g/dL (ref 6.0–8.3)
Total Bilirubin: 1.5 mg/dL — ABNORMAL HIGH (ref 0.2–1.2)

## 2017-10-13 NOTE — Patient Instructions (Addendum)
Donna Warren , Thank you for taking time to come for your Medicare Wellness Visit. I appreciate your ongoing commitment to your health goals. Please review the following plan we discussed and let me know if I can assist you in the future.   Will have your high dose flu vaccine  At the pharmacy as we are out in the office  Will contact Dr. Carlean Purl for scheudling of your colonoscopy   A Tetanus is recommended every 10 years. Medicare covers a tetanus if you have a cut or wound; otherwise, there may be a charge. If you had not had a tetanus with pertusses, known as the Tdap, you can take this anytime.   [email or web address] Did break and fx cracked femur on the right from a fall from running  [email or web address]  These are the goals we discussed: Goals    . Weight (lb) < 127 lb (57.6 kg)     Try to cut back on sugar!        This is a list of the screening recommended for you and due dates:  Health Maintenance  Topic Date Due  . Tetanus Vaccine  06/18/2017  . Colon Cancer Screening  08/12/2017  . Flu Shot  08/21/2017  . Mammogram  08/02/2019  . DEXA scan (bone density measurement)  Completed  .  Hepatitis C: One time screening is recommended by Center for Disease Control  (CDC) for  adults born from [birth year] through 1965.   Completed  . Pneumonia vaccines  Completed    Health Maintenance for Postmenopausal Women Menopause is a normal process in which your reproductive ability comes to an end. This process happens gradually over a span of months to years, usually between the ages of 67 and 76. Menopause is complete when you have missed 12 consecutive menstrual periods. It is important to talk with your health care provider about some of the most common conditions that affect postmenopausal women, such as heart disease, cancer, and bone loss (osteoporosis). Adopting a healthy lifestyle and getting preventive care can help to promote your health and wellness. Those actions can  also lower your chances of developing some of these common conditions. What should I know about menopause? During menopause, you may experience a number of symptoms, such as:  Moderate-to-severe hot flashes.  Night sweats.  Decrease in sex drive.  Mood swings.  Headaches.  Tiredness.  Irritability.  Memory problems.  Insomnia.  Choosing to treat or not to treat menopausal changes is an individual decision that you make with your health care provider. What should I know about hormone replacement therapy and supplements? Hormone therapy products are effective for treating symptoms that are associated with menopause, such as hot flashes and night sweats. Hormone replacement carries certain risks, especially as you become older. If you are thinking about using estrogen or estrogen with progestin treatments, discuss the benefits and risks with your health care provider. What should I know about heart disease and stroke? Heart disease, heart attack, and stroke become more likely as you age. This may be due, in part, to the hormonal changes that your body experiences during menopause. These can affect how your body processes dietary fats, triglycerides, and cholesterol. Heart attack and stroke are both medical emergencies. There are many things that you can do to help prevent heart disease and stroke:  Have your blood pressure checked at least every 1-2 years. High blood pressure causes heart disease and increases the risk of stroke.  If you are 55-79 years  old, ask your health care provider if you should take aspirin to prevent a heart attack or a stroke.  Do not use any tobacco products, including cigarettes, chewing tobacco, or electronic cigarettes. If you need help quitting, ask your health care provider.  It is important to eat a healthy diet and maintain a healthy weight. ? Be sure to include plenty of vegetables, fruits, low-fat dairy products, and lean protein. ? Avoid eating  foods that are high in solid fats, added sugars, or salt (sodium).  Get regular exercise. This is one of the most important things that you can do for your health. ? Try to exercise for at least 150 minutes each week. The type of exercise that you do should increase your heart rate and make you sweat. This is known as moderate-intensity exercise. ? Try to do strengthening exercises at least twice each week. Do these in addition to the moderate-intensity exercise.  Know your numbers.Ask your health care provider to check your cholesterol and your blood glucose. Continue to have your blood tested as directed by your health care provider.  What should I know about cancer screening? There are several types of cancer. Take the following steps to reduce your risk and to catch any cancer development as early as possible. Breast Cancer  Practice breast self-awareness. ? This means understanding how your breasts normally appear and feel. ? It also means doing regular breast self-exams. Let your health care provider know about any changes, no matter how small.  If you are 72 or older, have a clinician do a breast exam (clinical breast exam or CBE) every year. Depending on your age, family history, and medical history, it may be recommended that you also have a yearly breast X-ray (mammogram).  If you have a family history of breast cancer, talk with your health care provider about genetic screening.  If you are at high risk for breast cancer, talk with your health care provider about having an MRI and a mammogram every year.  Breast cancer (BRCA) gene test is recommended for women who have family members with BRCA-related cancers. Results of the assessment will determine the need for genetic counseling and BRCA1 and for BRCA2 testing. BRCA-related cancers include these types: ? Breast. This occurs in males or females. ? Ovarian. ? Tubal. This may also be called fallopian tube cancer. ? Cancer of the  abdominal or pelvic lining (peritoneal cancer). ? Prostate. ? Pancreatic.  Cervical, Uterine, and Ovarian Cancer Your health care provider may recommend that you be screened regularly for cancer of the pelvic organs. These include your ovaries, uterus, and vagina. This screening involves a pelvic exam, which includes checking for microscopic changes to the surface of your cervix (Pap test).  For women ages 21-65, health care providers may recommend a pelvic exam and a Pap test every three years. For women ages 56-65, they may recommend the Pap test and pelvic exam, combined with testing for human papilloma virus (HPV), every five years. Some types of HPV increase your risk of cervical cancer. Testing for HPV may also be done on women of any age who have unclear Pap test results.  Other health care providers may not recommend any screening for nonpregnant women who are considered low risk for pelvic cancer and have no symptoms. Ask your health care provider if a screening pelvic exam is right for you.  If you have had past treatment for cervical cancer or a condition that could lead to cancer, you need  Pap tests and screening for cancer for at least 20 years after your treatment. If Pap tests have been discontinued for you, your risk factors (such as having a new sexual partner) need to be reassessed to determine if you should start having screenings again. Some women have medical problems that increase the chance of getting cervical cancer. In these cases, your health care provider may recommend that you have screening and Pap tests more often.  If you have a family history of uterine cancer or ovarian cancer, talk with your health care provider about genetic screening.  If you have vaginal bleeding after reaching menopause, tell your health care provider.  There are currently no reliable tests available to screen for ovarian cancer.  Lung Cancer Lung cancer screening is recommended for adults  40-55 years old who are at high risk for lung cancer because of a history of smoking. A yearly low-dose CT scan of the lungs is recommended if you:  Currently smoke.  Have a history of at least 30 pack-years of smoking and you currently smoke or have quit within the past 15 years. A pack-year is smoking an average of one pack of cigarettes per day for one year.  Yearly screening should:  Continue until it has been 15 years since you quit.  Stop if you develop a health problem that would prevent you from having lung cancer treatment.  Colorectal Cancer  This type of cancer can be detected and can often be prevented.  Routine colorectal cancer screening usually begins at age 82 and continues through age 20.  If you have risk factors for colon cancer, your health care provider may recommend that you be screened at an earlier age.  If you have a family history of colorectal cancer, talk with your health care provider about genetic screening.  Your health care provider may also recommend using home test kits to check for hidden blood in your stool.  A small camera at the end of a tube can be used to examine your colon directly (sigmoidoscopy or colonoscopy). This is done to check for the earliest forms of colorectal cancer.  Direct examination of the colon should be repeated every 5-10 years until age 66. However, if early forms of precancerous polyps or small growths are found or if you have a family history or genetic risk for colorectal cancer, you may need to be screened more often.  Skin Cancer  Check your skin from head to toe regularly.  Monitor any moles. Be sure to tell your health care provider: ? About any new moles or changes in moles, especially if there is a change in a mole's shape or color. ? If you have a mole that is larger than the size of a pencil eraser.  If any of your family members has a history of skin cancer, especially at a young age, talk with your health  care provider about genetic screening.  Always use sunscreen. Apply sunscreen liberally and repeatedly throughout the day.  Whenever you are outside, protect yourself by wearing long sleeves, pants, a wide-brimmed hat, and sunglasses.  What should I know about osteoporosis? Osteoporosis is a condition in which bone destruction happens more quickly than new bone creation. After menopause, you may be at an increased risk for osteoporosis. To help prevent osteoporosis or the bone fractures that can happen because of osteoporosis, the following is recommended:  If you are 35-38 years old, get at least 1,000 mg of calcium and at least 600  mg of vitamin D per day.  If you are older than age 59 but younger than age 20, get at least 1,200 mg of calcium and at least 600 mg of vitamin D per day.  If you are older than age 8, get at least 1,200 mg of calcium and at least 800 mg of vitamin D per day.  Smoking and excessive alcohol intake increase the risk of osteoporosis. Eat foods that are rich in calcium and vitamin D, and do weight-bearing exercises several times each week as directed by your health care provider. What should I know about how menopause affects my mental health? Depression may occur at any age, but it is more common as you become older. Common symptoms of depression include:  Low or sad mood.  Changes in sleep patterns.  Changes in appetite or eating patterns.  Feeling an overall lack of motivation or enjoyment of activities that you previously enjoyed.  Frequent crying spells.  Talk with your health care provider if you think that you are experiencing depression. What should I know about immunizations? It is important that you get and maintain your immunizations. These include:  Tetanus, diphtheria, and pertussis (Tdap) booster vaccine.  Influenza every year before the flu season begins.  Pneumonia vaccine.  Shingles vaccine.  Your health care provider may also  recommend other immunizations. This information is not intended to replace advice given to you by your health care provider. Make sure you discuss any questions you have with your health care provider. Document Released: 03/01/2005 Document Revised: 07/28/2015 Document Reviewed: 10/11/2014 Elsevier Interactive Patient Education  2018 [place] in the Home Falls can cause injuries. They can happen to people of all ages. There are many things you can do to make your home safe and to help prevent falls. What can I do on the outside of my home?  Regularly fix the edges of walkways and driveways and fix any cracks.  Remove anything that might make you trip as you walk through a door, such as a raised step or threshold.  Trim any bushes or trees on the path to your home.  Use bright outdoor lighting.  Clear any walking paths of anything that might make someone trip, such as rocks or tools.  Regularly check to see if handrails are loose or broken. Make sure that both sides of any steps have handrails.  Any raised decks and porches should have guardrails on the edges.  Have any leaves, snow, or ice cleared regularly.  Use sand or salt on walking paths during winter.  Clean up any spills in your garage right away. This includes oil or grease spills. What can I do in the bathroom?  Use night lights.  Install grab bars by the toilet and in the tub and shower. Do not use towel bars as grab bars.  Use non-skid mats or decals in the tub or shower.  If you need to sit down in the shower, use a plastic, non-slip stool.  Keep the floor dry. Clean up any water that spills on the floor as soon as it happens.  Remove soap buildup in the tub or shower regularly.  Attach bath mats securely with double-sided non-slip rug tape.  Do not have throw rugs and other things on the floor that can make you trip. What can I do in the bedroom?  Use night lights.  Make sure that  you have a light by your bed that is easy to  reach.  Do not use any sheets or blankets that are too big for your bed. They should not hang down onto the floor.  Have a firm chair that has side arms. You can use this for support while you get dressed.  Do not have throw rugs and other things on the floor that can make you trip. What can I do in the kitchen?  Clean up any spills right away.  Avoid walking on wet floors.  Keep items that you use a lot in easy-to-reach places.  If you need to reach something above you, use a strong step stool that has a grab bar.  Keep electrical cords out of the way.  Do not use floor polish or wax that makes floors slippery. If you must use wax, use non-skid floor wax.  Do not have throw rugs and other things on the floor that can make you trip. What can I do with my stairs?  Do not leave any items on the stairs.  Make sure that there are handrails on both sides of the stairs and use them. Fix handrails that are broken or loose. Make sure that handrails are as long as the stairways.  Check any carpeting to make sure that it is firmly attached to the stairs. Fix any carpet that is loose or worn.  Avoid having throw rugs at the top or bottom of the stairs. If you do have throw rugs, attach them to the floor with carpet tape.  Make sure that you have a light switch at the top of the stairs and the bottom of the stairs. If you do not have them, ask someone to add them for you. What else can I do to help prevent falls?  Wear shoes that: ? Do not have high heels. ? Have rubber bottoms. ? Are comfortable and fit you well. ? Are closed at the toe. Do not wear sandals.  If you use a stepladder: ? Make sure that it is fully opened. Do not climb a closed stepladder. ? Make sure that both sides of the stepladder are locked into place. ? Ask someone to hold it for you, if possible.  Clearly mark and make sure that you can see: ? Any grab bars or  handrails. ? First and last steps. ? Where the edge of each step is.  Use tools that help you move around (mobility aids) if they are needed. These include: ? Canes. ? Walkers. ? Scooters. ? Crutches.  Turn on the lights when you go into a dark area. Replace any light bulbs as soon as they burn out.  Set up your furniture so you have a clear path. Avoid moving your furniture around.  If any of your floors are uneven, fix them.  If there are any pets around you, be aware of where they are.  Review your medicines with your doctor. Some medicines can make you feel dizzy. This can increase your chance of falling. Ask your doctor what other things that you can do to help prevent falls. This information is not intended to replace advice given to you by your health care provider. Make sure you discuss any questions you have with your health care provider. Document Released: 11/03/2008 Document Revised: 06/15/2015 Document Reviewed: 02/11/2014 Elsevier Interactive Patient Education  [employer].

## 2017-10-13 NOTE — Progress Notes (Signed)
Chief Complaint  Patient presents with  . Elbow Pain    Pt c/o elbow pain x 31months or more. Pt states that she was given Diflucan gel by Dr D. in April and Arthritis was ruled out thorugh xray. Pt states that pain comes and goes, pain is at times only in forearm and others she will have tingling in her hand and her hand will feel cold and achy.     HPI: Donna Warren 67 y.o. come in for  sda   Since early in the year she has been struggling with right elbow pain seen by her rheumatologist who x-rayed it and not felt to be arthritis.  Given topical diclofenac using it sparingly as needed without obvious help her symptoms wax and wane is been more problematic recently. Describes it as more of an ache in the proximal forearm up to the right lateral elbow.  Sometimes the hand feels cold especially if it was it down in position.  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 X ray no arthritis in joint . Sometimes sharp in elbow and then ache at times  Right elbow and cold  But not tingling. Sensation  .   And  Some  Movements  hruts  Deeper than skin.   Is right handed .  ocassional use of   diclofenac anc ibuprofen.   No history of carpal tunnel injury she is right-handed does gardening but no other major repetitive activity. No current neck problems radiating to her arm.  She was here this week for repeat laboratory studies monitoring her LFTs. ROS: See pertinent positives and negatives per HPI.  Past Medical History:  Diagnosis Date  . 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 Femur fracture, right ([facility]) 2003  . Fibroids   . Gilbert's syndrome   . History of liver biopsy nl    records of fatty liver 2008 not confirmed   . Hx of adenomatous colonic polyps   . Meniere disease    Right ear  . Osteoporosis   . Osteoporosis   . Other  hemochromatosis    heterozygosity for h63d gene  . Spontaneous abortion     Family History  Problem Relation Age of Onset  . Colon cancer Father   . Other Father        tinnitus  . Heart attack Father   . Depression Mother   . Anxiety disorder Mother   . Dementia Mother   . Other Mother        high calcium  . Hearing loss Mother        death  . Pulmonary disease Mother   . Stroke Unknown        silent  . Other Brother        elevated LFTs  . Healthy Daughter   . Healthy Daughter     Social History   Socioeconomic History  . Marital status: Married    Spouse name: Not on file  . Number of children: Not on file  . Years of education: Not on file  . Highest education level: Not on file  Occupational History  . Not on file  Social Needs  . Financial resource strain: Not on file  . Food insecurity:    Worry: Not on file    Inability: Not on file  . Transportation needs:  Medical: Not on file    Non-medical: Not on file  Tobacco Use  . Smoking status: Never Smoker  . Smokeless tobacco: Never Used  Substance and Sexual Activity  . Alcohol use: Yes    Alcohol/week: 1.0 - 2.0 standard drinks    Types: 1 - 2 Glasses of wine per week    Comment: glass of wine a week  . Drug use: No  . Sexual activity: Not on file  Lifestyle  . Physical activity:    Days per week: Not on file    Minutes per session: Not on file  . Stress: Not on file  Relationships  . Social connections:    Talks on phone: Not on file    Gets together: Not on file    Attends religious service: Not on file    Active member of club or organization: Not on file    Attends meetings of clubs or organizations: Not on file    Relationship status: Not on file  Other Topics Concern  . Not on file  Social History Narrative        2 daughters   Husband a [employer] director  Working 84 - 15.  Travels    Daily caffeine use   Sleep  Often disrupted.    Alcohol about  once a week.   Hof 2 pet dogs    Outpatient Medications Prior to Visit  Medication Sig Dispense Refill  . Calcium Carb-Cholecalciferol (CALCIUM 600/VITAMIN D3) 600-800 MG-UNIT TABS Take 1 tablet by mouth daily.    . chlorthalidone (HYGROTON) 25 MG tablet Take 25 mg by mouth daily.    . cholecalciferol (VITAMIN D) 1000 units tablet Take 1 tablet by mouth daily.    . diclofenac sodium (VOLTAREN) 1 % GEL Apply 3 g to 3 large joints up to 3 times a day. 3 Tube 3  . TURMERIC PO Take by mouth daily.     No facility-administered medications prior to visit.      EXAM:  BP (!) 102/58 (BP Location: Right Arm, Patient Position: Sitting, Cuff Size: Normal)   Pulse (!) 58   Wt 131 lb (59.4 kg)   BMI 22.49 kg/m   Body mass index is 22.49 kg/m.  GENERAL: vitals reviewed and listed above, alert, oriented, appears well hydrated and in no acute distress HEENT: atraumatic, conjunctiva  clear, no obvious abnormalities on inspection of external nose and ears  NECK: no obvious masses on inspection palpation    CV: HRRR, no clubbing cyanosis or  peripheral edema nl cap refill  MS: moves all extremities without noticeable focal  Abnormality right upper extremity without atrophy crepitus redness or swelling.  Tenderness seems diffusely localized to the right lateral epicondyle attachment.  Normal range of motion no obvious weakness.  Good range of motion. PSYCH: pleasant and cooperative, no obvious depression or anxiety Lab Results  Component Value Date   WBC 3.5 (L) 10/13/2017   HGB 12.6 10/13/2017   HCT 35.8 (L) 10/13/2017   PLT 202.0 10/13/2017   GLUCOSE 91 10/13/2017   CHOL 206 (H) 10/13/2017   TRIG 81.0 10/13/2017   HDL 66.50 10/13/2017   LDLDIRECT 129.1 02/16/2013   LDLCALC 123 (H) 10/13/2017   ALT 20 10/13/2017   AST 20 10/13/2017   NA 140 10/13/2017   K 4.1 10/13/2017   CL 100 10/13/2017   CREATININE 1.01 10/13/2017   BUN 19 10/13/2017   CO2 34 (H) 10/13/2017  TSH 1.38  12/06/2015   INR 0.92 06/23/2013   HGBA1C 5.1 09/21/2008   BP Readings from Last 3 Encounters:  10/13/17 (!) 102/58  10/13/17 92/60  05/06/17 105/61    ASSESSMENT AND PLAN:  Discussed the following assessment and plan:  Right elbow pain - Plan: Ambulatory referral to Sports Medicine This is reminiscent of epicondylitis has seen rheumatology and given topical diclofenac but inconsistent use.  Consent discussed this is a possible overuse ice at the end of the day avoiding certain activities add some exercises and get a consult from sports medicine. Point-of-care ultrasound might be helpful in defining the problem and plans for future management. When lab results are all and will contact her about the results her PV was due at the end of November we will move this out to January 2020. The 10-year ASCVD risk score [name] DC Brooke Bonito., et al., 2013) is: 3.8%   Values used to calculate the score:     Age: 21 years     Sex: Female     Is Non-Hispanic African American: No     Diabetic: No     Tobacco smoker: No     Systolic Blood Pressure: 062 mmHg     Is BP treated: No     HDL Cholesterol: 66.5 mg/dL     Total Cholesterol: 206 mg/dL -Patient advised to return or notify health care team  if  new concerns arise.  Patient Instructions   Still think this acts     Like  Epicondylitis.   Consider .    We can do a referral .    To sports medicine   Since on going  problems  .  Will be contacted about appt and you cna change as needed.     Tennis Elbow Tennis elbow (lateral epicondylitis) is inflammation of the outer tendons of your forearm close to your elbow. Your tendons attach your muscles to your bones. The outer tendons of your forearm are used to extend your wrist, and they attach on the outside part of your elbow. Tennis elbow is often found in people who play tennis, but anyone may get the condition from repeatedly extending the wrist or turning the forearm. What are the causes? This  condition is caused by repeatedly extending your wrist and using your hands. It can result from sports or work that requires repetitive forearm movements. Tennis elbow may also be caused by an injury. What increases the risk? You have a higher risk of developing tennis elbow if you play tennis or another racquet sport. You also have a higher risk if you frequently use your hands for work. This condition is also more likely to develop in:  Musicians.  Carpenters, painters, and plumbers.  Cooks.  Cashiers.  People who work in [employer].  Architect workers.  Butchers.  People who use computers.  What are the signs or symptoms? Symptoms of this condition include:  Pain and tenderness in your forearm and the outer part of your elbow. You may only feel the pain when you use your arm, or you may feel it even when you are not using your arm.  A burning feeling that runs from your elbow through your arm.  Weak grip in your hands.  How is this diagnosed? This condition may be diagnosed by medical history and physical exam. You may also have other tests, including:  X-rays.  MRI.  How is this treated? Your health care provider will recommend lifestyle adjustments, such as  resting and icing your arm. Treatment may also include:  Medicines for inflammation. This may include shots of cortisone if your pain continues.  Physical therapy. This may include massage or exercises.  An elbow brace.  Surgery may eventually be recommended if your pain does not go away with treatment. Follow these instructions at home: Activity  Rest your elbow and wrist as directed by your health care provider. Try to avoid any activities that caused the problem until your health care provider says that you can do them again.  If a physical therapist teaches you exercises, do all of them as directed.  If you lift an object, lift it with your palm facing upward. This lowers the stress on your  elbow. Lifestyle  If your tennis elbow is caused by sports, check your equipment and make sure that: ? You are using it correctly. ? It is the best fit for you.  If your tennis elbow is caused by work, take breaks frequently, if you are able. Talk with your manager about how to best perform tasks in a way that is safe. ? If your tennis elbow is caused by computer use, talk with your manager about any changes that can be made to your work environment. General instructions  If directed, apply ice to the painful area: ? Put ice in a plastic bag. ? Place a towel between your skin and the bag. ? Leave the ice on for 20 minutes, 2-3 times per day.  Take medicines only as directed by your health care provider.  If you were given a brace, wear it as directed by your health care provider.  Keep all follow-up visits as directed by your health care provider. This is important. Contact a health care provider if:  Your pain does not get better with treatment.  Your pain gets worse.  You have numbness or weakness in your forearm, hand, or fingers. This information is not intended to replace advice given to you by your health care provider. Make sure you discuss any questions you have with your health care provider. Document Released: 01/07/2005 Document Revised: 09/07/2015 Document Reviewed: 01/03/2014 Elsevier Interactive Patient Education  2018 [place] Ask your health care provider which exercises are safe for you. Do exercises exactly as told by your health care provider and adjust them as directed. It is normal to feel mild stretching, pulling, tightness, or discomfort as you do these exercises, but you should stop right away if you feel sudden pain or your pain gets worse. Do not begin these exercises until told by your health care provider. Stretching and range of motion exercises These exercises warm up your muscles and joints and improve the movement and  flexibility of your elbow. These exercises also help to relieve pain, numbness, and tingling. Exercise A: Wrist extensor stretch 1. Extend your left / right elbow with your fingers pointing down. 2. Gently pull the palm of your left / right hand toward you until you feel a gentle stretch on the top of your forearm. 3. To increase the stretch, push your left / right hand toward the outer edge or pinkie side of your forearm. 4. Hold this position for __________ seconds. Repeat __________ times. Complete this exercise __________ times a day. If directed by your health care provider, repeat this stretch except do it with a bent elbow this time. Exercise B: Wrist flexor stretch  1. Extend your left / right elbow and turn your palm  upward. 2. Gently pull your left / right palm and fingertips back so your wrist extends and your fingers point more toward the ground. 3. You should feel a gentle stretch on the inside of your forearm. 4. Hold this position for __________ seconds. Repeat __________ times. Complete this exercise __________ times a day. If directed by your health care provider, repeat this stretch except do it with a bent elbow this time. Strengthening exercises These exercises build strength and endurance in your elbow. Endurance is the ability to use your muscles for a long time, even after they get tired. Exercise C: Wrist extensors  1. Sit with your left / right forearm palm-down and fully supported on a table or countertop. Your elbow should be resting below the height of your shoulder. 2. Let your left / right wrist extend over the edge of the surface. 3. Loosely hold a __________ weight or a piece of rubber exercise band or tubing in your left / right hand. Slowly curl your left / right hand up toward your forearm. If you are using band or tubing, hold the band or tubing in place with your other hand to provide resistance. 4. Hold this position for __________ seconds. 5. Slowly  return to the starting position. Repeat __________ times. Complete this exercise __________ times a day. Exercise D: Radial deviators  1. Stand with a __________ weight in your left / righthand. Or, sit while holding a rubber exercise band or tubing with your other arm supported on a table or countertop. Position your hand so your thumb is on top. 2. Raise your hand upward in front of you so your thumb travels toward your forearm, or pull up on the rubber tubing. 3. Hold this position for __________ seconds. 4. Slowly return to the starting position. Repeat __________ times. Complete this exercise __________ times a day. Exercise E: Eccentric wrist extensors 1. Sit with your left / right forearm palm-down and fully supported on a table or countertop. Your elbow should be resting below the height of your shoulder. 2. If told by your health care provider, hold a __________ weight in your hand. 3. Let your left / right wrist extend over the edge of the surface. 4. Use your other hand to lift up your left / right hand toward your forearm. Keep your forearm on the table. 5. Using only the muscles in your left / right hand, slowly lower your hand back down to the starting position. Repeat __________ times. Complete this exercise __________ times a day. This information is not intended to replace advice given to you by your health care provider. Make sure you discuss any questions you have with your health care provider. Document Released: 01/07/2005 Document Revised: 09/13/2015 Document Reviewed: 10/06/2014 Elsevier Interactive Patient Education  2018 [place]. Jaileen Janelle M.D.

## 2017-10-13 NOTE — Patient Instructions (Addendum)
Still think this acts     Like  Epicondylitis.   Consider .    We can do a referral .    To sports medicine   Since on going  problems  .  Will be contacted about appt and you cna change as needed.     Tennis Elbow Tennis elbow (lateral epicondylitis) is inflammation of the outer tendons of your forearm close to your elbow. Your tendons attach your muscles to your bones. The outer tendons of your forearm are used to extend your wrist, and they attach on the outside part of your elbow. Tennis elbow is often found in people who play tennis, but anyone may get the condition from repeatedly extending the wrist or turning the forearm. What are the causes? This condition is caused by repeatedly extending your wrist and using your hands. It can result from sports or work that requires repetitive forearm movements. Tennis elbow may also be caused by an injury. What increases the risk? You have a higher risk of developing tennis elbow if you play tennis or another racquet sport. You also have a higher risk if you frequently use your hands for work. This condition is also more likely to develop in:  Musicians.  Carpenters, painters, and plumbers.  Cooks.  Cashiers.  People who work in [employer].  Architect workers.  Butchers.  People who use computers.  What are the signs or symptoms? Symptoms of this condition include:  Pain and tenderness in your forearm and the outer part of your elbow. You may only feel the pain when you use your arm, or you may feel it even when you are not using your arm.  A burning feeling that runs from your elbow through your arm.  Weak grip in your hands.  How is this diagnosed? This condition may be diagnosed by medical history and physical exam. You may also have other tests, including:  X-rays.  MRI.  How is this treated? Your health care provider will recommend lifestyle adjustments, such as resting and icing your arm. Treatment may also  include:  Medicines for inflammation. This may include shots of cortisone if your pain continues.  Physical therapy. This may include massage or exercises.  An elbow brace.  Surgery may eventually be recommended if your pain does not go away with treatment. Follow these instructions at home: Activity  Rest your elbow and wrist as directed by your health care provider. Try to avoid any activities that caused the problem until your health care provider says that you can do them again.  If a physical therapist teaches you exercises, do all of them as directed.  If you lift an object, lift it with your palm facing upward. This lowers the stress on your elbow. Lifestyle  If your tennis elbow is caused by sports, check your equipment and make sure that: ? You are using it correctly. ? It is the best fit for you.  If your tennis elbow is caused by work, take breaks frequently, if you are able. Talk with your manager about how to best perform tasks in a way that is safe. ? If your tennis elbow is caused by computer use, talk with your manager about any changes that can be made to your work environment. General instructions  If directed, apply ice to the painful area: ? Put ice in a plastic bag. ? Place a towel between your skin and the bag. ? Leave the ice on for 20 minutes, 2-3 times  per day.  Take medicines only as directed by your health care provider.  If you were given a brace, wear it as directed by your health care provider.  Keep all follow-up visits as directed by your health care provider. This is important. Contact a health care provider if:  Your pain does not get better with treatment.  Your pain gets worse.  You have numbness or weakness in your forearm, hand, or fingers. This information is not intended to replace advice given to you by your health care provider. Make sure you discuss any questions you have with your health care provider. Document Released:  01/07/2005 Document Revised: 09/07/2015 Document Reviewed: 01/03/2014 Elsevier Interactive Patient Education  2018 [place] Ask your health care provider which exercises are safe for you. Do exercises exactly as told by your health care provider and adjust them as directed. It is normal to feel mild stretching, pulling, tightness, or discomfort as you do these exercises, but you should stop right away if you feel sudden pain or your pain gets worse. Do not begin these exercises until told by your health care provider. Stretching and range of motion exercises These exercises warm up your muscles and joints and improve the movement and flexibility of your elbow. These exercises also help to relieve pain, numbness, and tingling. Exercise A: Wrist extensor stretch 1. Extend your left / right elbow with your fingers pointing down. 2. Gently pull the palm of your left / right hand toward you until you feel a gentle stretch on the top of your forearm. 3. To increase the stretch, push your left / right hand toward the outer edge or pinkie side of your forearm. 4. Hold this position for __________ seconds. Repeat __________ times. Complete this exercise __________ times a day. If directed by your health care provider, repeat this stretch except do it with a bent elbow this time. Exercise B: Wrist flexor stretch  1. Extend your left / right elbow and turn your palm upward. 2. Gently pull your left / right palm and fingertips back so your wrist extends and your fingers point more toward the ground. 3. You should feel a gentle stretch on the inside of your forearm. 4. Hold this position for __________ seconds. Repeat __________ times. Complete this exercise __________ times a day. If directed by your health care provider, repeat this stretch except do it with a bent elbow this time. Strengthening exercises These exercises build strength and endurance in your elbow. Endurance  is the ability to use your muscles for a long time, even after they get tired. Exercise C: Wrist extensors  1. Sit with your left / right forearm palm-down and fully supported on a table or countertop. Your elbow should be resting below the height of your shoulder. 2. Let your left / right wrist extend over the edge of the surface. 3. Loosely hold a __________ weight or a piece of rubber exercise band or tubing in your left / right hand. Slowly curl your left / right hand up toward your forearm. If you are using band or tubing, hold the band or tubing in place with your other hand to provide resistance. 4. Hold this position for __________ seconds. 5. Slowly return to the starting position. Repeat __________ times. Complete this exercise __________ times a day. Exercise D: Radial deviators  1. Stand with a __________ weight in your left / righthand. Or, sit while holding a rubber exercise band or tubing  with your other arm supported on a table or countertop. Position your hand so your thumb is on top. 2. Raise your hand upward in front of you so your thumb travels toward your forearm, or pull up on the rubber tubing. 3. Hold this position for __________ seconds. 4. Slowly return to the starting position. Repeat __________ times. Complete this exercise __________ times a day. Exercise E: Eccentric wrist extensors 1. Sit with your left / right forearm palm-down and fully supported on a table or countertop. Your elbow should be resting below the height of your shoulder. 2. If told by your health care provider, hold a __________ weight in your hand. 3. Let your left / right wrist extend over the edge of the surface. 4. Use your other hand to lift up your left / right hand toward your forearm. Keep your forearm on the table. 5. Using only the muscles in your left / right hand, slowly lower your hand back down to the starting position. Repeat __________ times. Complete this exercise __________ times  a day. This information is not intended to replace advice given to you by your health care provider. Make sure you discuss any questions you have with your health care provider. Document Released: 01/07/2005 Document Revised: 09/13/2015 Document Reviewed: 10/06/2014 Elsevier Interactive Patient Education  [employer].

## 2017-10-16 LAB — VITAMIN D 1,25 DIHYDROXY
VITAMIN D 1, 25 (OH) TOTAL: 34 pg/mL (ref 18–72)
Vitamin D2 1, 25 (OH)2: <8 pg/mL
Vitamin D3 1, 25 (OH)2: 34 pg/mL

## 2017-10-20 ENCOUNTER — Encounter: Payer: Self-pay | Admitting: Family Medicine

## 2017-10-23 ENCOUNTER — Encounter: Payer: Self-pay | Admitting: Family Medicine

## 2017-10-23 ENCOUNTER — Ambulatory Visit (INDEPENDENT_AMBULATORY_CARE_PROVIDER_SITE_OTHER): Payer: PPO | Admitting: Family Medicine

## 2017-10-23 ENCOUNTER — Ambulatory Visit: Payer: Self-pay

## 2017-10-23 VITALS — BP 100/60 | HR 91 | Ht 64.0 in | Wt 130.0 lb

## 2017-10-23 DIAGNOSIS — S52123A Displaced fracture of head of unspecified radius, initial encounter for closed fracture: Secondary | ICD-10-CM

## 2017-10-23 DIAGNOSIS — S52124A Nondisplaced fracture of head of right radius, initial encounter for closed fracture: Secondary | ICD-10-CM | POA: Diagnosis not present

## 2017-10-23 DIAGNOSIS — M25521 Pain in right elbow: Secondary | ICD-10-CM

## 2017-10-23 DIAGNOSIS — Z23 Encounter for immunization: Secondary | ICD-10-CM | POA: Diagnosis not present

## 2017-10-23 HISTORY — DX: Displaced fracture of head of unspecified radius, initial encounter for closed fracture: S52.123A

## 2017-10-23 MED ORDER — VITAMIN D (ERGOCALCIFEROL) 1.25 MG (50000 UNIT) PO CAPS: 50000.0000 [IU] | ORAL_CAPSULE | ORAL | 0 refills | Status: DC

## 2017-10-23 MED ORDER — GABAPENTIN 100 MG PO CAPS: 200.0000 mg | ORAL_CAPSULE | Freq: Every day | ORAL | 3 refills | Status: DC

## 2017-10-23 NOTE — Progress Notes (Signed)
[facility] [address] [place], [place] [address] Phone: [phone] Subjective:    I Kandace Blitz am serving as a Education administrator for Dr. [name].   I'm seeing this patient by the request  of:  Panosh, Standley Brooking, MD   CC: Right elbow pain  [id number]  Donna Warren is a 67 y.o. female coming in with complaint of right elbow pain. Doesn't recall an injury. Sometimes posterior pain but mostly lateral pain. Sometimes she gets hand pain and it feels "cold". No numbness and tingling noted. Developing arthritis. Has xrays. Would like the high dose flu shot today.   Onset- Feburary Location- posterior, lateral Duration-  Character- sharp, achy Aggravating factors- cross body movements (moving covers), abduction, lifting weighted objects Reliving factors- Diclofenac topical  Therapies tried-  Severity-7 out of 10 when it occurs   Patient did have x-rays independently visualized by me.  Injection showed minimal osteoarthritic changes and otherwise fairly unremarkable.  Past Medical History:  Diagnosis Date  . 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 Femur fracture, right ([place]) 2003  . Fibroids   . Gilbert's syndrome   . History of liver biopsy nl    records of fatty liver 2008 not confirmed   . Hx of adenomatous colonic polyps   . Meniere disease    Right ear  . Osteoporosis   . Osteoporosis   . Other hemochromatosis    heterozygosity for h63d gene  . Spontaneous abortion    Past Surgical History:  Procedure Laterality Date  . BREAST CYST EXCISION Right   . [place]  . COLONOSCOPY  220, 2004, 2007, 08/13/2010   2002: 8 mm polyp 2004: no polyps 2007: no polyps 2012: hemorrhoids  . hosp r/o mi cv right chest and jaw pain  2010  . LIVER BIOPSY     2015 negative   no fatty liver  records  2008 said fatty liver    . OVARIAN CYST REMOVAL    .  spontaneously abortion on 3rd preg     triploid dna on path   Social History   Socioeconomic History  . Marital status: Married    Spouse name: Not on file  . Number of children: Not on file  . Years of education: Not on file  . Highest education level: Not on file  Occupational History  . Not on file  Social Needs  . Financial resource strain: Not on file  . Food insecurity:    Worry: Not on file    Inability: Not on file  . Transportation needs:    Medical: Not on file    Non-medical: Not on file  Tobacco Use  . Smoking status: Never Smoker  . Smokeless tobacco: Never Used  Substance and Sexual Activity  . Alcohol use: Yes    Alcohol/week: 1.0 - 2.0 standard drinks    Types: 1 - 2 Glasses of wine per week    Comment: glass of wine a week  . Drug use: No  . Sexual activity: Not on file  Lifestyle  . Physical activity:    Days per week: Not on file    Minutes per session: Not on file  . Stress: Not on file  Relationships  . Social connections:    Talks on phone: Not on file    Gets together: Not on file    Attends religious service: Not on  file    Active member of club or organization: Not on file    Attends meetings of clubs or organizations: Not on file    Relationship status: Not on file  Other Topics Concern  . Not on file  Social History Narrative        2 daughters   Husband a [employer] director  Working 44 - 31.  Travels    Daily caffeine use   Sleep  Often disrupted.    Alcohol about once a week.   Hof 2 pet dogs   Allergies  Allergen Reactions  . Penicillins     rash   Family History  Problem Relation Age of Onset  . Colon cancer Father   . Other Father        tinnitus  . Heart attack Father   . Depression Mother   . Anxiety disorder Mother   . Dementia Mother   . Other Mother        high calcium  . Hearing loss Mother        death  . Pulmonary disease Mother   . Stroke Unknown        silent  . Other  Brother        elevated LFTs  . Healthy Daughter   . Healthy Daughter      Current Outpatient Medications (Cardiovascular):  .  chlorthalidone (HYGROTON) 25 MG tablet, Take 25 mg by mouth daily.     Current Outpatient Medications (Other):  Marland Kitchen  Calcium Carb-Cholecalciferol (CALCIUM 600/VITAMIN D3) 600-800 MG-UNIT TABS, Take 1 tablet by mouth daily. .  cholecalciferol (VITAMIN D) 1000 units tablet, Take 1 tablet by mouth daily. .  diclofenac sodium (VOLTAREN) 1 % GEL, Apply 3 g to 3 large joints up to 3 times a day. .  TURMERIC PO, Take by mouth daily. Marland Kitchen  gabapentin (NEURONTIN) 100 MG capsule, Take 2 capsules (200 mg total) by mouth at bedtime. .  Vitamin D, Ergocalciferol, (DRISDOL) 50000 units CAPS capsule, Take 1 capsule (50,000 Units total) by mouth every 7 (seven) days.    Past medical history, social, surgical and family history all reviewed in electronic medical record.  No pertanent information unless stated regarding to the chief complaint.   Review of Systems:  No headache, visual changes, nausea, vomiting, diarrhea, constipation, dizziness, abdominal pain, skin rash, fevers, chills, night sweats, weight loss, swollen lymph nodes, body aches, joint swelling,  chest pain, shortness of breath, mood changes.  Positive muscle aches  Objective  Blood pressure 100/60, pulse 91, height 5\' 4"  (1.626 m), weight 130 lb (59 kg), SpO2 98 %.    General: No apparent distress alert and oriented x3 mood and affect normal, dressed appropriately.  HEENT: Pupils equal, extraocular movements intact  Respiratory: Patient's speak in full sentences and does not appear short of breath  Cardiovascular: No lower extremity edema, non tender, no erythema  Skin: Warm dry intact with no signs of infection or rash on extremities or on axial skeleton.  Abdomen: Soft nontender  Neuro: Cranial nerves II through XII are intact, neurovascularly intact in all extremities with 2+ DTRs and 2+ pulses.  Lymph:  No lymphadenopathy of posterior or anterior cervical chain or axillae bilaterally.  Gait normal with good balance and coordination.  MSK:  Non tender with full range of motion and good stability and symmetric strength and tone of shoulders,  wrist, hip, knee and ankles bilaterally.  Elbow: Right Unremarkable to inspection.  Range of motion full pronation, supination, flexion, extension. Strength is full to all of the above directions Stable to varus, valgus stress. Negative moving valgus stress test. = Mild pain in the antecubital fossa Ulnar nerve does not sublux. Negative cubital tunnel Tinel's.  Difficulty over the radial nerve Contralateral elbow unremarkable  Musculoskeletal ultrasound was performed and interpreted by Charlann Boxer D.O.   Elbow:  Lateral epicondyle and common extensor tendon origin visualized.  Trace effusion of the joint noted.  Radial head very small nonhealing nondisplaced fracture noted Medial epicondyle and common flexor tendon origin visualized.  No edema, effusions, or avulsions seen. Ulnar nerve in cubital tunnel unremarkable. Olecranon and triceps insertion visualized and unremarkable without edema, effusion, or avulsion.  No signs olecranon bursitis. Power doppler signal normal.  IMPRESSION: Small joint effusion with a very small nonhealing radial head fracture   Impression and Recommendations:     This case required medical decision making of moderate complexity. The above documentation has been reviewed and is accurate and complete Lyndal Pulley, DO       Note: This dictation was prepared with Dragon dictation along with smaller phrase technology. Any transcriptional errors that result from this process are unintentional.

## 2017-10-23 NOTE — Assessment & Plan Note (Signed)
Patient still has radial head fracture discussed icing regimen and home exercises discussed icing regimen and home exercise.  Patient did have a mild effusion of the joint as well.  Radial nerve seems to be irritated as well.  Discussed icing regimen and home exercises.  Follow-up again in 4 weeks for further evaluation

## 2017-10-23 NOTE — Patient Instructions (Signed)
Good to see you  Radial head mild fracture  Ice 20 minutes 2 times daily. Usually after activity and before bed. pennsaid pinkie amount topically 2 times daily as needed.  Exercises 3 times a week.  Gabapentin 200mg  at nght Once weekly vitamin D for 12 weeks.  See em agai ni n3-4 weeks and we will make sure you are doing better

## 2017-10-28 ENCOUNTER — Telehealth: Payer: Self-pay | Admitting: Internal Medicine

## 2017-10-28 NOTE — Telephone Encounter (Signed)
Copied from [place] [phone]. Topic: Quick Communication - See Telephone Encounter >> Oct 28, 2017 11:16 AM Rutherford Nail, NT wrote: CRM for notification. See Telephone encounter for: 10/28/17. Patient calling to see if she could come pick up samples of 40 MG diclofenac or could Dr Tamala Julian write her a prescription? States that she had received samples at her last visit and is running low. States that she is going out of town.  [id number] [place], [place] - [place] CB#: [phone]

## 2017-10-28 NOTE — Telephone Encounter (Signed)
Spoke to pt, she would like samples of pennsaid & pt will come by to pick them up later today.

## 2017-11-05 ENCOUNTER — Ambulatory Visit: Payer: PPO | Admitting: Rheumatology

## 2017-11-12 ENCOUNTER — Encounter: Payer: Self-pay | Admitting: Internal Medicine

## 2017-11-14 ENCOUNTER — Other Ambulatory Visit: Payer: Self-pay | Admitting: Family Medicine

## 2017-11-17 ENCOUNTER — Encounter: Payer: Self-pay | Admitting: Internal Medicine

## 2017-11-20 ENCOUNTER — Ambulatory Visit: Payer: PPO | Admitting: Family Medicine

## 2017-11-26 NOTE — Progress Notes (Signed)
[facility] [address] [place], [place] [address] Phone: [phone] Subjective:   [name], am serving as a scribe for Dr. [name].    CC: Elbow pain follow-up  [id number]  Donna Warren is a 67 y.o. female coming in with complaint of right elbow better. Pain in hand and forearm have diminished. Does still have pain in the elbow intermittently. Pain with supination and often in the morning. Pain over olecranon. Completed gabapentin. Is using pennsaid topically. Continues with Vitamin D.  Overall would state that she is 75% better      Past Medical History:  Diagnosis Date  . 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 Femur fracture, right ([place]) 2003  . Fibroids   . Gilbert's syndrome   . History of liver biopsy nl    records of fatty liver 2008 not confirmed   . Hx of adenomatous colonic polyps   . Meniere disease    Right ear  . Osteoporosis   . Osteoporosis   . Other hemochromatosis    heterozygosity for h63d gene  . Spontaneous abortion    Past Surgical History:  Procedure Laterality Date  . BREAST CYST EXCISION Right   . [place]  . COLONOSCOPY  220, 2004, 2007, 08/13/2010   2002: 8 mm polyp 2004: no polyps 2007: no polyps 2012: hemorrhoids  . hosp r/o mi cv right chest and jaw pain  2010  . LIVER BIOPSY     2015 negative   no fatty liver  records  2008 said fatty liver    . OVARIAN CYST REMOVAL    . spontaneously abortion on 3rd preg     triploid dna on path   Social History   Socioeconomic History  . Marital status: Married    Spouse name: Not on file  . Number of children: Not on file  . Years of education: Not on file  . Highest education level: Not on file  Occupational History  . Not on file  Social Needs  . Financial resource strain: Not on file  . Food insecurity:    Worry: Not on file    Inability: Not  on file  . Transportation needs:    Medical: Not on file    Non-medical: Not on file  Tobacco Use  . Smoking status: Never Smoker  . Smokeless tobacco: Never Used  Substance and Sexual Activity  . Alcohol use: Yes    Alcohol/week: 1.0 - 2.0 standard drinks    Types: 1 - 2 Glasses of wine per week    Comment: glass of wine a week  . Drug use: No  . Sexual activity: Not on file  Lifestyle  . Physical activity:    Days per week: Not on file    Minutes per session: Not on file  . Stress: Not on file  Relationships  . Social connections:    Talks on phone: Not on file    Gets together: Not on file    Attends religious service: Not on file    Active member of club or organization: Not on file    Attends meetings of clubs or organizations: Not on file    Relationship status: Not on file  Other Topics Concern  . 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  Travels    Daily caffeine use   Sleep  Often disrupted.    Alcohol about once a week.   Hof 2 pet dogs   Allergies  Allergen Reactions  . Penicillins     rash   Family History  Problem Relation Age of Onset  . Colon cancer Father   . Other Father        tinnitus  . Heart attack Father   . Depression Mother   . Anxiety disorder Mother   . Dementia Mother   . Other Mother        high calcium  . Hearing loss Mother        death  . Pulmonary disease Mother   . Stroke Unknown        silent  . Other Brother        elevated LFTs  . Healthy Daughter   . Healthy Daughter      Current Outpatient Medications (Cardiovascular):  .  chlorthalidone (HYGROTON) 25 MG tablet, Take 25 mg by mouth daily.     Current Outpatient Medications (Other):  Marland Kitchen  Calcium Carb-Cholecalciferol (CALCIUM 600/VITAMIN D3) 600-800 MG-UNIT TABS, Take 1 tablet by mouth daily. .  diclofenac sodium (VOLTAREN) 1 % GEL, Apply 3 g to 3 large joints up  to 3 times a day. .  gabapentin (NEURONTIN) 100 MG capsule, TAKE 2 CAPSULES (200 MG TOTAL) BY MOUTH AT BEDTIME. Marland Kitchen  TURMERIC PO, Take by mouth daily. .  Vitamin D, Ergocalciferol, (DRISDOL) 50000 units CAPS capsule, Take 1 capsule (50,000 Units total) by mouth every 7 (seven) days.    Past medical history, social, surgical and family history all reviewed in electronic medical record.  No pertanent information unless stated regarding to the chief complaint.   Review of Systems:  No headache, visual changes, nausea, vomiting, diarrhea, constipation, dizziness, abdominal pain, skin rash, fevers, chills, night sweats, weight loss, swollen lymph nodes, body aches, joint swelling, muscle aches, chest pain, shortness of breath, mood changes.  Mild positive muscle aches  Objective  Blood pressure 110/68, height 5\' 4"  (1.626 m), weight 132 lb (59.9 kg).    General: No apparent distress alert and oriented x3 mood and affect normal, dressed appropriately.  HEENT: Pupils equal, extraocular movements intact  Respiratory: Patient's speak in full sentences and does not appear short of breath  Cardiovascular: No lower extremity edema, non tender, no erythema  Skin: Warm dry intact with no signs of infection or rash on extremities or on axial skeleton.  Abdomen: Soft nontender  Neuro: Cranial nerves II through XII are intact, neurovascularly intact in all extremities with 2+ DTRs and 2+ pulses.  Lymph: No lymphadenopathy of posterior or anterior cervical chain or axillae bilaterally.  Gait normal with good balance and coordination.  MSK:  Non tender with full range of motion and good stability and symmetric strength and tone of shoulders,  wrist, hip, knee and ankles bilaterally.  Elbow: Right Unremarkable to inspection. Range of motion full pronation, supination, flexion, extension. Strength is full to all of the above directions Stable to varus, valgus stress. Negative moving valgus stress test. Mild  pain over the radial head on the antecubital fossa Ulnar nerve does not sublux. Negative cubital tunnel Tinel's. Contralateral elbow unremarkable  Musculoskeletal ultrasound was performed and interpreted by Charlann Boxer D.O.   Elbow: Right elbow Lateral epicondyle and common extensor tendon origin visualized.  Abnormality of radial head previously seen  seems to be improved at this time. Medial epicondyle and common flexor tendon origin visualized.  No edema, effusions, or avulsions seen. Ulnar nerve in cubital tunnel unremarkable. Effusion of the joint noted  IMPRESSION:  NORMAL except for mild effusion of the joint   Impression and Recommendations:      The above documentation has been reviewed and is accurate and complete Lyndal Pulley, DO       Note: This dictation was prepared with Dragon dictation along with smaller phrase technology. Any transcriptional errors that result from this process are unintentional.

## 2017-11-27 ENCOUNTER — Ambulatory Visit: Payer: PPO | Admitting: Family Medicine

## 2017-11-28 ENCOUNTER — Ambulatory Visit (INDEPENDENT_AMBULATORY_CARE_PROVIDER_SITE_OTHER): Payer: PPO | Admitting: Family Medicine

## 2017-11-28 ENCOUNTER — Ambulatory Visit: Payer: Self-pay

## 2017-11-28 VITALS — BP 110/68 | Ht 64.0 in | Wt 132.0 lb

## 2017-11-28 DIAGNOSIS — M25521 Pain in right elbow: Secondary | ICD-10-CM

## 2017-11-28 DIAGNOSIS — S52124A Nondisplaced fracture of head of right radius, initial encounter for closed fracture: Secondary | ICD-10-CM | POA: Diagnosis not present

## 2017-11-28 NOTE — Progress Notes (Signed)
Office Visit Note  Patient: Donna Warren             Date of Birth: 10-Mar-1950           MRN: [id number]             PCP: Burnis Medin, MD Referring: Burnis Medin, MD Visit Date: 12/12/2017 Occupation: @GUAROCC @  Subjective:  Increased joint pain  History of Present Illness: Donna Warren is a 67 y.o. female with history of osteoarthritis, DDD, and osteoporosis.  According to patient she has been having increased pain and stiffness in her hands especially over the DIP joints.  She has been also noticing discomfort in her bilateral feet.  She states she was having some discomfort in her right forearm for which she was seen by Dr. Tamala Julian.  She states Dr. Tamala Julian did ultrasound and told her that she had radial fracture and nerve entrapment.  She was given gabapentin for pain relief.  She has noticed some improvement but she still has ongoing discomfort in her right elbow.  She notices some swelling over the DIP joints.  Activities of Daily Living:  Patient reports morning stiffness for 10 minutes.   Patient Denies nocturnal pain.  Difficulty dressing/grooming: Denies Difficulty climbing stairs: Denies Difficulty getting out of chair: Denies Difficulty using hands for taps, buttons, cutlery, and/or writing: Denies  Review of Systems  Constitutional: Negative for fatigue.  HENT: Negative for mouth sores, mouth dryness and nose dryness.   Eyes: Negative for pain, visual disturbance and dryness.  Respiratory: Negative for cough, hemoptysis, shortness of breath and difficulty breathing.   Cardiovascular: Negative for chest pain, palpitations, hypertension and swelling in legs/feet.  Gastrointestinal: Negative for blood in stool, constipation and diarrhea.  Endocrine: Negative for increased urination.  Genitourinary: Negative for painful urination, nocturia and pelvic pain.  Musculoskeletal: Positive for arthralgias, joint pain, joint swelling and morning stiffness. Negative for myalgias,  muscle weakness, muscle tenderness and myalgias.  Skin: Positive for hair loss. Negative for color change, pallor, rash, nodules/bumps, skin tightness, ulcers and sensitivity to sunlight.  Allergic/Immunologic: Negative for susceptible to infections.  Neurological: Negative for dizziness, numbness, headaches, memory loss and weakness.  Hematological: Negative for swollen glands.  Psychiatric/Behavioral: Negative for depressed mood, confusion and sleep disturbance. The patient is not nervous/anxious.     PMFS History:  Patient Active Problem List   Diagnosis Date Noted  . Radial head fracture 10/23/2017  . 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 Primary osteoarthritis of both hands 06/05/2016  . DJD (degenerative joint disease), cervical 05/08/2016  . Female genital lesion 08/12/2014  . Osteoporosis 07/20/2014  . Elevated LFTs 05/21/2013  . 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 Meniere's disease 10/14/2011  . Skin lesion 08/14/2011  . Right shoulder strain 08/13/2011  . Phlebitis, superficial 08/16/2010  . DECREASED HEARING, RIGHT EAR 11/08/2009  . PROBLEMS WITH HEARING 11/08/2009  . DYSLIPIDEMIA 10/07/2008  . Gilbert syndrome 10/26/2007  . FIBROIDS, UTERUS 05/29/2007  . LIVER HEMANGIOMA 05/29/2007  . COLONIC POLYPS, HX OF 05/29/2007    Past Medical History:  Diagnosis Date  . 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 Femur fracture, right ([place]) 2003  . Fibroids   . Gilbert's syndrome   . History of liver biopsy nl    records of fatty liver 2008 not confirmed   . Hx of  adenomatous colonic polyps   . Meniere disease    Right ear  . Osteoporosis   . Osteoporosis   . Other hemochromatosis    heterozygosity for h63d gene  . Spontaneous abortion     Family History  Problem Relation Age of Onset    . Colon cancer Father   . Other Father        tinnitus  . Heart attack Father   . Depression Mother   . Anxiety disorder Mother   . Dementia Mother   . Other Mother        high calcium  . Hearing loss Mother        death  . Pulmonary disease Mother   . Stroke Unknown        silent  . Other Brother        elevated LFTs  . Healthy Daughter   . Healthy Daughter    Past Surgical History:  Procedure Laterality Date  . BREAST CYST EXCISION Right   . [place]  . COLONOSCOPY  220, 2004, 2007, 08/13/2010   2002: 8 mm polyp 2004: no polyps 2007: no polyps 2012: hemorrhoids  . hosp r/o mi cv right chest and jaw pain  2010  . LIVER BIOPSY     2015 negative   no fatty liver  records  2008 said fatty liver    . OVARIAN CYST REMOVAL    . spontaneously abortion on 3rd preg     triploid dna on path   Social History   Social History Narrative        2 daughters   Husband a [employer] director  Working 3 - 54.  Travels    Daily caffeine use   Sleep  Often disrupted.    Alcohol about once a week.   Hof 2 pet dogs    Objective: Vital Signs: BP (!) 86/58 (BP Location: Left Arm, Patient Position: Sitting, Cuff Size: Normal)   Pulse 67   Resp 13   Ht 5\' 4"  (1.626 m)   Wt 130 lb 12.8 oz (59.3 kg)   BMI 22.45 kg/m    Physical Exam  Constitutional: She is oriented to person, place, and time. She appears well-developed and well-nourished.  HENT:  Head: Normocephalic and atraumatic.  Eyes: Conjunctivae and EOM are normal.  Neck: Normal range of motion.  Cardiovascular: Normal rate, regular rhythm, normal heart sounds and intact distal pulses.  Pulmonary/Chest: Effort normal and breath sounds normal.  Abdominal: Soft. Bowel sounds are normal.  Lymphadenopathy:    She has no cervical adenopathy.  Neurological: She is alert and oriented to person, place, and time.  Skin: Skin is warm and dry. Capillary refill takes less than 2 seconds.   Psychiatric: She has a normal mood and affect. Her behavior is normal.  Nursing note and vitals reviewed.    Musculoskeletal Exam: C-spine thoracic lumbar spine some limitation with range of motion.  Shoulder joints elbow joints were in good range of motion.  She has bilateral DIP thickening with no synovitis.  Hip joints knee joints ankles MTPs PIPs were in good range of motion with no synovitis.  She has bilateral pes planus and first MTP thickening consistent with osteoarthritis.  CDAI Exam: CDAI Score: Not documented Patient Global Assessment: Not documented; Provider Global Assessment: Not documented Swollen: Not documented; Tender: Not documented Joint Exam   Not documented   There is currently no information documented on the  homunculus. Go to the Rheumatology activity and complete the homunculus joint exam.  Investigation: No additional findings.  Imaging: [place] Limited Joint Space Structures Up Right  Result Date: 12/05/2017 Musculoskeletal ultrasound was performed and interpreted by Charlann Boxer D.O. Elbow: Right elbow Lateral epicondyle and common extensor tendon origin visualized. Abnormality of radial head previously seen seems to be improved at this time. Medial epicondyle and common flexor tendon origin visualized. No edema, effusions, or avulsions seen. Ulnar nerve in cubital tunnel unremarkable. Effusion of the joint noted   NORMAL except for mild effusion of the joint    Recent Labs: Lab Results  Component Value Date   WBC 3.5 (L) 10/13/2017   HGB 12.6 10/13/2017   PLT 202.0 10/13/2017   NA 140 10/13/2017   K 4.1 10/13/2017   CL 100 10/13/2017   CO2 34 (H) 10/13/2017   GLUCOSE 91 10/13/2017   BUN 19 10/13/2017   CREATININE 1.01 10/13/2017   BILITOT 1.5 (H) 10/13/2017   ALKPHOS 60 10/13/2017   AST 20 10/13/2017   ALT 20 10/13/2017   PROT 7.1 10/13/2017   ALBUMIN 4.4 10/13/2017   CALCIUM 10.1 10/13/2017   GFRAA 64 05/10/2016    Speciality Comments: No  specialty comments available.  Procedures:  No procedures performed Allergies: Penicillins   Assessment / Plan:     Visit Diagnoses: Primary osteoarthritis of both hands-patient has no synovitis on examination she has DIP thickening with some inflammation.  She has elevated LFTs I would avoid all NSAIDs.  Natural anti-inflammatories and joint protection was discussed.  Pain in both feet-she has bilateral pes planus and also some first MTP thickening.  Proper fitting shoes with arch support were discussed.  Right elbow pain-patient states that she was diagnosed with right radial head fracture by Dr. Tamala Julian.  She also was told that she has nerve entrapment.  She has been taking Neurontin.  She has follow-up coming up with Dr. Tamala Julian.  DDD (degenerative disc disease), cervical-she does not have much discomfort in her cervical spine.  DDD (degenerative disc disease), lumbar-she has intermittent chronic lower back pain.  Age-related osteoporosis without current pathological fracture - August 2018 T score -2.5 with change of -1.0% compared to the bone density of 2016.  She has been getting bone density through her PCP.  Have advised her to discuss possible use of Prolia or bisphosphonates.  Positive ANA (antinuclear antibody)-she has no clinical features of autoimmune disease.  She had low titer positive ANA and ENA was negative.  Meniere's disease, unspecified laterality  Rosanna Randy syndrome  Hemochromatosis carrier  Elevated LFTs - Patient reports that her liver biopsy was negative in the past.    Orders: No orders of the defined types were placed in this encounter.  No orders of the defined types were placed in this encounter.   Face-to-face time spent with patient was 30 minutes. Greater than 50% of time was spent in counseling and coordination of care.  Follow-Up Instructions: Return in about 1 year (around 12/13/2018) for Osteoarthritis, DDD, Osteoporosis.   Bo Merino,  MD  Note - This record has been created using Editor, commissioning.  Chart creation errors have been sought, but may not always  have been located. Such creation errors do not reflect on  the standard of medical care.

## 2017-11-28 NOTE — Patient Instructions (Signed)
God to se eyou  You will do great  Compression sleeve can give  alittle support and keep the swelling down.  Ice at the end of a long  Day for 20 minutes pennsaid pinkie amount topically 2 times daily as needed.  See em again in 6-7 weeks to make sure it is gone

## 2017-11-29 ENCOUNTER — Encounter: Payer: Self-pay | Admitting: Family Medicine

## 2017-11-29 NOTE — Assessment & Plan Note (Signed)
Believe the patient has made significant improvement.  Still have a joint effusion.  Patient is making progress notes will not change management except for encouraging a compression sleeve at this time.  Follow-up again in 6 to 8 weeks if not fully resolved

## 2017-12-12 ENCOUNTER — Encounter: Payer: Self-pay | Admitting: Rheumatology

## 2017-12-12 ENCOUNTER — Ambulatory Visit: Payer: PPO | Admitting: Rheumatology

## 2017-12-12 VITALS — BP 86/58 | HR 67 | Resp 13 | Ht 64.0 in | Wt 130.8 lb

## 2017-12-12 DIAGNOSIS — M810 Age-related osteoporosis without current pathological fracture: Secondary | ICD-10-CM | POA: Diagnosis not present

## 2017-12-12 DIAGNOSIS — R945 Abnormal results of liver function studies: Secondary | ICD-10-CM | POA: Diagnosis not present

## 2017-12-12 DIAGNOSIS — Z148 Genetic carrier of other disease: Secondary | ICD-10-CM

## 2017-12-12 DIAGNOSIS — M25521 Pain in right elbow: Secondary | ICD-10-CM | POA: Diagnosis not present

## 2017-12-12 DIAGNOSIS — M19041 Primary osteoarthritis, right hand: Secondary | ICD-10-CM

## 2017-12-12 DIAGNOSIS — M5030 Other cervical disc degeneration, unspecified cervical region: Secondary | ICD-10-CM | POA: Diagnosis not present

## 2017-12-12 DIAGNOSIS — H8109 Meniere's disease, unspecified ear: Secondary | ICD-10-CM | POA: Diagnosis not present

## 2017-12-12 DIAGNOSIS — M19042 Primary osteoarthritis, left hand: Secondary | ICD-10-CM

## 2017-12-12 DIAGNOSIS — M79672 Pain in left foot: Secondary | ICD-10-CM

## 2017-12-12 DIAGNOSIS — M79671 Pain in right foot: Secondary | ICD-10-CM

## 2017-12-12 DIAGNOSIS — R768 Other specified abnormal immunological findings in serum: Secondary | ICD-10-CM

## 2017-12-12 DIAGNOSIS — R7989 Other specified abnormal findings of blood chemistry: Secondary | ICD-10-CM

## 2017-12-12 DIAGNOSIS — M5136 Other intervertebral disc degeneration, lumbar region: Secondary | ICD-10-CM

## 2017-12-25 DIAGNOSIS — H90A21 Sensorineural hearing loss, unilateral, right ear, with restricted hearing on the contralateral side: Secondary | ICD-10-CM | POA: Diagnosis not present

## 2017-12-25 DIAGNOSIS — H9311 Tinnitus, right ear: Secondary | ICD-10-CM | POA: Diagnosis not present

## 2017-12-25 DIAGNOSIS — H8101 Meniere's disease, right ear: Secondary | ICD-10-CM | POA: Diagnosis not present

## 2017-12-29 NOTE — Progress Notes (Signed)
[name] [address] [place], [place] [address] Phone: [phone] Subjective:      CC: Right elbow pain follow-up  [id number]  Donna Warren is a 67 y.o. female coming in with complaint of right elbow pain. Still has consistent pain. Sometimes the pain is sharp and achy. Stiff. Has full ROM but feels the pain with certain movements.  Initially found to have more of a radial head injury as well as a joint effusion.  Patient has done somewhat better but it still been 8 months of continuous pain.  Patient states that she is still never without pain.  Sometimes feels like a sharp pain that can be severe enough that stops her from activity.  Sometimes feels like she has swelling in the joint itself.     Past Medical History:  Diagnosis Date  . 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 Femur fracture, right ([place]) 2003  . Fibroids   . Gilbert's syndrome   . History of liver biopsy nl    records of fatty liver 2008 not confirmed   . Hx of adenomatous colonic polyps   . Meniere disease    Right ear  . Osteoporosis   . Osteoporosis   . Other hemochromatosis    heterozygosity for h63d gene  . Spontaneous abortion    Past Surgical History:  Procedure Laterality Date  . BREAST CYST EXCISION Right   . [place]  . COLONOSCOPY  220, 2004, 2007, 08/13/2010   2002: 8 mm polyp 2004: no polyps 2007: no polyps 2012: hemorrhoids  . hosp r/o mi cv right chest and jaw pain  2010  . LIVER BIOPSY     2015 negative   no fatty liver  records  2008 said fatty liver    . OVARIAN CYST REMOVAL    . spontaneously abortion on 3rd preg     triploid dna on path   Social History   Socioeconomic History  . Marital status: Married    Spouse name: Not on file  . Number of children: Not on file  . Years of education: Not on file  . Highest education level: Not on file    Occupational History  . Not on file  Social Needs  . Financial resource strain: Not on file  . Food insecurity:    Worry: Not on file    Inability: Not on file  . Transportation needs:    Medical: Not on file    Non-medical: Not on file  Tobacco Use  . Smoking status: Never Smoker  . Smokeless tobacco: Never Used  Substance and Sexual Activity  . Alcohol use: Yes    Alcohol/week: 1.0 - 2.0 standard drinks    Types: 1 - 2 Glasses of wine per week    Comment: glass of wine a week  . Drug use: No  . Sexual activity: Not on file  Lifestyle  . Physical activity:    Days per week: Not on file    Minutes per session: Not on file  . Stress: Not on file  Relationships  . Social connections:    Talks on phone: Not on file    Gets together: Not on file    Attends religious service: Not on file    Active member of club or organization: Not on file    Attends meetings of clubs or organizations: Not on file  Relationship status: Not on file  Other Topics Concern  . Not on file  Social History Narrative        2 daughters   Husband a [employer] director  Working 2 - 65.  Travels    Daily caffeine use   Sleep  Often disrupted.    Alcohol about once a week.   Hof 2 pet dogs   Allergies  Allergen Reactions  . Penicillins     rash   Family History  Problem Relation Age of Onset  . Colon cancer Father   . Other Father        tinnitus  . Heart attack Father   . Depression Mother   . Anxiety disorder Mother   . Dementia Mother   . Other Mother        high calcium  . Hearing loss Mother        death  . Pulmonary disease Mother   . Stroke Unknown        silent  . Other Brother        elevated LFTs  . Healthy Daughter   . Healthy Daughter      Current Outpatient Medications (Cardiovascular):  .  chlorthalidone (HYGROTON) 25 MG tablet, Take 25 mg by mouth daily.     Current Outpatient Medications (Other):  Marland Kitchen  Calcium  Carb-Cholecalciferol (CALCIUM 600/VITAMIN D3) 600-800 MG-UNIT TABS, Take 1 tablet by mouth daily. .  Diclofenac Sodium (PENNSAID TD), Place onto the skin as needed. .  diclofenac sodium (VOLTAREN) 1 % GEL, Apply 3 g to 3 large joints up to 3 times a day. .  gabapentin (NEURONTIN) 100 MG capsule, TAKE 2 CAPSULES (200 MG TOTAL) BY MOUTH AT BEDTIME. Marland Kitchen  TURMERIC PO, Take by mouth daily. .  Vitamin D, Ergocalciferol, (DRISDOL) 50000 units CAPS capsule, Take 1 capsule (50,000 Units total) by mouth every 7 (seven) days.    Past medical history, social, surgical and family history all reviewed in electronic medical record.  No pertanent information unless stated regarding to the chief complaint.   Review of Systems:  No headache, visual changes, nausea, vomiting, diarrhea, constipation, dizziness, abdominal pain, skin rash, fevers, chills, night sweats, weight loss, swollen lymph nodes, body aches, joint swelling, , chest pain, shortness of breath, mood changes.  Positive muscle aches  Objective  Blood pressure 104/60, pulse 64, height 5\' 4"  (1.626 m), SpO2 97 %.    General: No apparent distress alert and oriented x3 mood and affect normal, dressed appropriately.  HEENT: Pupils equal, extraocular movements intact  Respiratory: Patient's speak in full sentences and does not appear short of breath  Cardiovascular: No lower extremity edema, non tender, no erythema  Skin: Warm dry intact with no signs of infection or rash on extremities or on axial skeleton.  Abdomen: Soft nontender  Neuro: Cranial nerves II through XII are intact, neurovascularly intact in all extremities with 2+ DTRs and 2+ pulses.  Lymph: No lymphadenopathy of posterior or anterior cervical chain or axillae bilaterally.  Gait normal with good balance and coordination.  MSK:  Non tender with full range of motion and good stability and symmetric strength and tone of shoulders, wrist, hip, knee and ankles bilaterally.  Elbow:  Right Unremarkable to inspection. Range of motion full pronation, supination, flexion, extension. Strength is full to all of the above directions Mild instability noted with varus force of the elbow Tender to palpation over the radial head still noted. Ulnar  nerve does not sublux. Negative cubital tunnel Tinel's. Contra lateral elbow unremarkable  Musculoskeletal ultrasound was performed and interpreted by Charlann Boxer D.O.   Elbow:  Patient's elbow does have still is very small effusion noted.  Patient's radial head seems to be unremarkable at this time.  Patient does have a trace effusion noted on the posterior aspect of the elbow as well.  No true masses or significant abnormal vascularity noted.  IMPRESSION: Continued effusion   Impression and Recommendations:     This case required medical decision making of moderate complexity. The above documentation has been reviewed and is accurate and complete Lyndal Pulley, DO       Note: This dictation was prepared with Dragon dictation along with smaller phrase technology. Any transcriptional errors that result from this process are unintentional.

## 2017-12-30 ENCOUNTER — Encounter: Payer: Self-pay | Admitting: Family Medicine

## 2017-12-30 ENCOUNTER — Ambulatory Visit: Payer: Self-pay

## 2017-12-30 ENCOUNTER — Ambulatory Visit: Payer: PPO | Admitting: Family Medicine

## 2017-12-30 VITALS — BP 104/60 | HR 64 | Ht 64.0 in

## 2017-12-30 DIAGNOSIS — S52124G Nondisplaced fracture of head of right radius, subsequent encounter for closed fracture with delayed healing: Secondary | ICD-10-CM | POA: Diagnosis not present

## 2017-12-30 DIAGNOSIS — G8929 Other chronic pain: Secondary | ICD-10-CM

## 2017-12-30 DIAGNOSIS — M25521 Pain in right elbow: Secondary | ICD-10-CM

## 2017-12-30 NOTE — Patient Instructions (Signed)
Good to see you  Ice is your friend Stay active W ewill get MRI of the right elbow  I will write you when I get the results.  Happy holidays!

## 2017-12-30 NOTE — Assessment & Plan Note (Signed)
Radial head had a fracture initially and seemed to actually improve.  Patient is from previous x-rays back in April did not show any significant bony abnormality and everything had been seen on the ultrasound.  Patient has done all conservative therapy including home exercise, formal physical therapy and continues to have pain and instability.  Feels that advanced imaging is warranted.  MRI would be beneficial to rule out any other bony abnormality or soft tissue abnormality that could be contributing to patient's pain.  Spent  25 minutes with patient face-to-face and had greater than 50% of counseling including as described above in assessment and plan.

## 2018-01-05 ENCOUNTER — Telehealth: Payer: Self-pay | Admitting: Internal Medicine

## 2018-01-05 NOTE — Telephone Encounter (Signed)
Copied from [place] [phone]. Topic: Quick Communication - See Telephone Encounter >> Jan 05, 2018 10:26 AM Bea Graff, NT wrote: CRM for notification. See Telephone encounter for: 01/05/18. Freda Munro with [facility] states she contacted pt to schedule, but the MRI is ordered for the incorrect elbow. Pt states Dr. Tamala Julian has been treating her for her right elbow. Requesting order to be corrected. CB#: [phone] ext: [phone]

## 2018-01-05 NOTE — Telephone Encounter (Signed)
Order has been fixed. 

## 2018-01-05 NOTE — Addendum Note (Signed)
Addended by: Douglass Rivers T on: 01/05/2018 11:07 AM   Modules accepted: Orders

## 2018-01-08 ENCOUNTER — Other Ambulatory Visit: Payer: Self-pay | Admitting: Family Medicine

## 2018-01-20 ENCOUNTER — Encounter: Payer: Self-pay | Admitting: Internal Medicine

## 2018-01-30 ENCOUNTER — Other Ambulatory Visit: Payer: PPO

## 2018-02-12 DIAGNOSIS — H5213 Myopia, bilateral: Secondary | ICD-10-CM | POA: Diagnosis not present

## 2018-02-13 ENCOUNTER — Ambulatory Visit
Admission: RE | Admit: 2018-02-13 | Discharge: 2018-02-13 | Disposition: A | Discharge status: Home / Self Care | Payer: PPO | Source: Ambulatory Visit | Attending: Family Medicine | Admitting: Family Medicine

## 2018-02-13 DIAGNOSIS — M25521 Pain in right elbow: Secondary | ICD-10-CM | POA: Diagnosis not present

## 2018-02-13 DIAGNOSIS — S52124G Nondisplaced fracture of head of right radius, subsequent encounter for closed fracture with delayed healing: Secondary | ICD-10-CM

## 2018-03-06 ENCOUNTER — Ambulatory Visit (AMBULATORY_SURGERY_CENTER): Payer: Self-pay | Admitting: *Deleted

## 2018-03-06 ENCOUNTER — Encounter: Payer: Self-pay | Admitting: Internal Medicine

## 2018-03-06 VITALS — Ht 64.0 in | Wt 136.0 lb

## 2018-03-06 DIAGNOSIS — Z800 Family history of malignant neoplasm of digestive organs: Secondary | ICD-10-CM

## 2018-03-06 DIAGNOSIS — Z86010 Personal history of colonic polyps: Secondary | ICD-10-CM

## 2018-03-06 NOTE — Progress Notes (Signed)
No egg or soy allergy known to patient  No issues with past sedation with any surgeries  or procedures, no intubation problems  No diet pills per patient No home 02 use per patient  No blood thinners per patient  Pt denies issues with constipation  No A fib or A flutter  EMMI video sent to pt's e mail pt declined   

## 2018-03-17 DIAGNOSIS — H02814 Retained foreign body in left upper eyelid: Secondary | ICD-10-CM | POA: Diagnosis not present

## 2018-03-20 ENCOUNTER — Ambulatory Visit (AMBULATORY_SURGERY_CENTER): Payer: PPO | Admitting: Internal Medicine

## 2018-03-20 ENCOUNTER — Encounter: Payer: Self-pay | Admitting: Internal Medicine

## 2018-03-20 VITALS — BP 103/70 | HR 57 | Temp 96.6°F | Resp 10 | Ht 64.0 in | Wt 130.0 lb

## 2018-03-20 DIAGNOSIS — D120 Benign neoplasm of cecum: Secondary | ICD-10-CM | POA: Diagnosis not present

## 2018-03-20 DIAGNOSIS — Z86010 Personal history of colonic polyps: Secondary | ICD-10-CM

## 2018-03-20 DIAGNOSIS — K635 Polyp of colon: Secondary | ICD-10-CM

## 2018-03-20 DIAGNOSIS — Z1211 Encounter for screening for malignant neoplasm of colon: Secondary | ICD-10-CM | POA: Diagnosis not present

## 2018-03-20 DIAGNOSIS — D124 Benign neoplasm of descending colon: Secondary | ICD-10-CM

## 2018-03-20 HISTORY — PX: COLONOSCOPY: SHX174

## 2018-03-20 MED ORDER — SODIUM CHLORIDE 0.9 % IV SOLN: 500.0000 mL | Freq: Once | INTRAVENOUS | Status: DC

## 2018-03-20 NOTE — Patient Instructions (Addendum)
I found and removed 2 small polyps today. I will let you know pathology results and when to have another routine colonoscopy by mail and/or My Chart.  You also have a condition called diverticulosis - common and not usually a problem. Please read the handout provided.  I appreciate the opportunity to care for you. Gatha Mayer, MD, [facility]  Polyp handout given to patient. Diverticulosis handout given to patient.  Resume previous diet. Continue present medications.  Repeat colonoscopy recommended for surveillance.  Date to be determined after pathology results reviewed.  YOU HAD AN ENDOSCOPIC PROCEDURE TODAY AT [facility]:   Refer to the procedure report that was given to you for any specific questions about what was found during the examination.  If the procedure report does not answer your questions, please call your gastroenterologist to clarify.  If you requested that your care partner not be given the details of your procedure findings, then the procedure report has been included in a sealed envelope for you to review at your convenience later.  YOU SHOULD EXPECT: Some feelings of bloating in the abdomen. Passage of more gas than usual.  Walking can help get rid of the air that was put into your GI tract during the procedure and reduce the bloating. If you had a lower endoscopy (such as a colonoscopy or flexible sigmoidoscopy) you may notice spotting of blood in your stool or on the toilet paper. If you underwent a bowel prep for your procedure, you may not have a normal bowel movement for a few days.  Please Note:  You might notice some irritation and congestion in your nose or some drainage.  This is from the oxygen used during your procedure.  There is no need for concern and it should clear up in a day or so.  SYMPTOMS TO REPORT IMMEDIATELY:   Following lower endoscopy (colonoscopy or flexible sigmoidoscopy):  Excessive amounts of blood in the  stool  Significant tenderness or worsening of abdominal pains  Swelling of the abdomen that is new, acute  Fever of 100F or higher For urgent or emergent issues, a gastroenterologist can be reached at any hour by calling [phone].   DIET:  We do recommend a small meal at first, but then you may proceed to your regular diet.  Drink plenty of fluids but you should avoid alcoholic beverages for 24 hours.  ACTIVITY:  You should plan to take it easy for the rest of today and you should NOT DRIVE or use heavy machinery until tomorrow (because of the sedation medicines used during the test).    FOLLOW UP: Our staff will call the number listed on your records the next business day following your procedure to check on you and address any questions or concerns that you may have regarding the information given to you following your procedure. If we do not reach you, we will leave a message.  However, if you are feeling well and you are not experiencing any problems, there is no need to return our call.  We will assume that you have returned to your regular daily activities without incident.  If any biopsies were taken you will be contacted by phone or by letter within the next 1-3 weeks.  Please call us at [phone] if you have not heard about the biopsies in 3 weeks.    SIGNATURES/CONFIDENTIALITY: You and/or your care partner have signed paperwork which will be entered into your electronic medical record.  These signatures attest to the fact that that the information above on your After Visit Summary has been reviewed and is understood.  Full responsibility of the confidentiality of this discharge information lies with you and/or your care-partner.

## 2018-03-20 NOTE — Progress Notes (Signed)
Called to room to assist during endoscopic procedure.  Patient ID and intended procedure confirmed with present staff. Received instructions for my participation in the procedure from the performing physician.  

## 2018-03-20 NOTE — Op Note (Signed)
[facility] Patient Name: Donna Warren Procedure Date: 03/20/2018 8:57 AM MRN: [id number] Endoscopist: Gatha Mayer , MD Age: 68 Referring MD:  Date of Birth: Feb 16, 1950 Gender: Female Account #: [id number] Procedure:                Colonoscopy Indications:              Surveillance: Personal history of adenomatous                            polyps on last colonoscopy > 5 years ago Medicines:                Propofol per Anesthesia, Monitored Anesthesia Care Procedure:                Pre-Anesthesia Assessment:                           - Prior to the procedure, a History and Physical                            was performed, and patient medications and                            allergies were reviewed. The patient's tolerance of                            previous anesthesia was also reviewed. The risks                            and benefits of the procedure and the sedation                            options and risks were discussed with the patient.                            All questions were answered, and informed consent                            was obtained. Prior Anticoagulants: The patient has                            taken no previous anticoagulant or antiplatelet                            agents. ASA Grade Assessment: II - A patient with                            mild systemic disease. After reviewing the risks                            and benefits, the patient was deemed in                            satisfactory condition to undergo the procedure.  After obtaining informed consent, the colonoscope                            was passed under direct vision. Throughout the                            procedure, the patient's blood pressure, pulse, and                            oxygen saturations were monitored continuously. The                            Colonoscope was introduced through the anus and   advanced to the the cecum, identified by                            appendiceal orifice and ileocecal valve. The                            colonoscopy was performed without difficulty. The                            patient tolerated the procedure well. The quality                            of the bowel preparation was excellent. The                            ileocecal valve, appendiceal orifice, and rectum                            were photographed. The bowel preparation used was                            Miralax. Scope In: 9:04:54 AM Scope Out: 9:30:38 AM Scope Withdrawal Time: 0 hours 21 minutes 9 seconds  Total Procedure Duration: 0 hours 25 minutes 44 seconds  Findings:                 The perianal and digital rectal examinations were                            normal.                           Two sessile polyps were found in the cecum. The                            polyps were 2 to 6 mm in size. These polyps were                            removed with a cold snare. Resection and retrieval                            were complete. Verification of patient  identification for the specimen was done. Estimated                            blood loss was minimal.                           Multiple diverticula were found in the sigmoid                            colon.                           The exam was otherwise without abnormality on                            direct and retroflexion views. Complications:            No immediate complications. Estimated Blood Loss:     Estimated blood loss was minimal. Impression:               - Two 2 to 6 mm polyps in the cecum, removed with a                            cold snare. Resected and retrieved.                           - Diverticulosis in the sigmoid colon.                           - The examination was otherwise normal on direct                            and retroflexion views.                            - Personal history of colonic polyp 8 mm polyp 2002                            none since then. Recommendation:           - Patient has a contact number available for                            emergencies. The signs and symptoms of potential                            delayed complications were discussed with the                            patient. Return to normal activities tomorrow.                            Written discharge instructions were provided to the                            patient.                           -  Resume previous diet.                           - Continue present medications.                           - Repeat colonoscopy is recommended for                            surveillance. The colonoscopy date will be                            determined after pathology results from today's                            exam become available for review. Gatha Mayer, MD 03/20/2018 9:38:06 AM This report has been signed electronically.

## 2018-03-20 NOTE — Progress Notes (Signed)
Pt's states no medical or surgical changes since previsit or office visit. 

## 2018-03-20 NOTE — Progress Notes (Signed)
To PACU, VSS. Report to [email or web address] 

## 2018-03-23 ENCOUNTER — Telehealth: Payer: Self-pay

## 2018-03-23 NOTE — Telephone Encounter (Signed)
Left message to call with any questions or concerns

## 2018-03-23 NOTE — Telephone Encounter (Signed)
Left message on f/u call 

## 2018-03-24 ENCOUNTER — Encounter: Payer: Self-pay | Admitting: Internal Medicine

## 2018-03-24 NOTE — Progress Notes (Signed)
2 ssp's max 6 mm Recall guidelines 5-10 years Will recommend 7  My Chart

## 2018-04-17 ENCOUNTER — Encounter: Payer: Self-pay | Admitting: Internal Medicine

## 2018-04-17 ENCOUNTER — Ambulatory Visit (INDEPENDENT_AMBULATORY_CARE_PROVIDER_SITE_OTHER): Payer: PPO | Admitting: Internal Medicine

## 2018-04-17 ENCOUNTER — Other Ambulatory Visit: Payer: Self-pay

## 2018-04-17 DIAGNOSIS — F419 Anxiety disorder, unspecified: Secondary | ICD-10-CM

## 2018-04-17 DIAGNOSIS — F439 Reaction to severe stress, unspecified: Secondary | ICD-10-CM

## 2018-04-17 MED ORDER — LORAZEPAM 0.5 MG PO TABS: ORAL_TABLET | ORAL | 0 refills | Status: DC

## 2018-04-17 MED ORDER — CITALOPRAM HYDROBROMIDE 10 MG PO TABS: 5.0000 mg | ORAL_TABLET | Freq: Every day | ORAL | 1 refills | Status: DC

## 2018-04-17 NOTE — Progress Notes (Signed)
Virtual Visit via Video Note  I connected with@ on 04/17/18 at 11:00 AM EDT by a video enabled telemedicine application and verified that I am speaking with the correct person using two identifiers.  Location patient: home Location provider:work or home office Persons participating in the virtual visit: patient, provider  limitations of evaluation and management by telemedicine and the availability of in person appointments. The patient expressed understanding and agreed to proceed.   HPI: Donna Warren contacts [place] for help with weight visit regarding to stress and anxiety. She generally handles stress well and does many measures but over the last weeks to months she is having increased anxiety and stress and has had a very difficult time at work adjusting to new technology and job changes related to the COVID-19 epidemic.  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 She has no panic attacks but sometimes feels like her heart is racing and has a dry mouth. Her routine self calming measures do not seem to be as helpful and asked for help despite great relaxant 2.  She describes decreasing ability to retain information when she is trying to learn new things at work not sleeping quite as well.  Sometimes has 10-hour days in the last week Denies depression is some alcohol at night but not excessive.  Family history her daughter is been on Effexor for years and seems to do well. She has a past history of depression and is been on medicine I not sure which but does not feel depressed at this time.    ROS: See pertinent positives and negatives per HPI.  Past Medical History:  Diagnosis Date  . 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 Arthritis   . Cataract    developing  . Femur fracture, right ([place]) 2003  . Fibroids   . Gilbert's syndrome   . History of liver biopsy  nl    records of fatty liver 2008 not confirmed   . Hx of adenomatous colonic polyps   . Meniere disease    Right ear  . Osteoporosis   . Osteoporosis   . Other hemochromatosis    heterozygosity for h63d gene  . Spontaneous abortion     Past Surgical History:  Procedure Laterality Date  . BREAST CYST EXCISION Right   . [place]  . COLONOSCOPY  220, 2004, 2007, 08/13/2010   2002: 8 mm polyp 2004: no polyps 2007: no polyps 2012: hemorrhoids  . hosp r/o mi cv right chest and jaw pain  2010  . LIVER BIOPSY     2015 negative   no fatty liver  records  2008 said fatty liver    . OVARIAN CYST REMOVAL    . POLYPECTOMY    . spontaneously abortion on 3rd preg     triploid dna on path    Family History  Problem Relation Age of Onset  . Colon cancer Father        dx'd late to mid 54's  . Other Father        tinnitus  . Heart attack Father   . Depression Mother   . Anxiety disorder Mother   . Dementia Mother   . Other Mother        high calcium  . Hearing loss Mother        death  . Pulmonary disease Mother   .  Stroke Other        silent  . Other Brother        elevated LFTs  . Healthy Daughter   . Healthy Daughter   . Colon polyps Neg Hx   . Esophageal cancer Neg Hx   . Rectal cancer Neg Hx   . 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  Calcium Carb-Cholecalciferol (CALCIUM 600/VITAMIN D3) 600-800 MG-UNIT TABS, Take 1 tablet by mouth daily., Disp: , Rfl:  .  chlorthalidone (HYGROTON) 25 MG tablet, Take 25 mg by mouth daily., Disp: , Rfl:  .  cholecalciferol (VITAMIN D3) 25 MCG (1000 UT) tablet, Take 1,000 Units by mouth daily., Disp: , Rfl:  .  citalopram (CELEXA) 10 MG tablet, Take 0.5 tablets (5 mg total) by mouth daily., Disp: 30 tablet, Rfl: 1 .  diazepam (VALIUM) 5 MG tablet, , Disp: , Rfl:  .  Diclofenac Sodium (PENNSAID TD), Place onto the skin as needed.,  Disp: , Rfl:  .  diclofenac sodium (VOLTAREN) 1 % GEL, Apply 3 g to 3 large joints up to 3 times a day. (Patient not taking: Reported on 03/06/2018), Disp: 3 Tube, Rfl: 3 .  gabapentin (NEURONTIN) 100 MG capsule, TAKE 2 CAPSULES (200 MG TOTAL) BY MOUTH AT BEDTIME., Disp: 180 capsule, Rfl: 2 .  LORazepam (ATIVAN) 0.5 MG tablet, 1-2 po if needed for anxiety up to every 8 hours, Disp: 24 tablet, Rfl: 0 .  ondansetron (ZOFRAN-ODT) 4 MG disintegrating tablet, Take 4 mg by mouth every 8 (eight) hours as needed. , Disp: , Rfl: 1 .  TURMERIC PO, Take by mouth daily., Disp: , Rfl:  .  Vitamin D, Ergocalciferol, (DRISDOL) 1.25 MG (50000 UT) CAPS capsule, TAKE 1 CAPSULE (50,000 UNITS TOTAL) BY MOUTH EVERY 7 (SEVEN) DAYS., Disp: 12 capsule, Rfl: 0  Current Facility-Administered Medications:  .  0.9 %  sodium chloride infusion, 500 mL, Intravenous, Once, Gatha Mayer, MD  EXAM:  VITALS per patient if applicable:  GENERAL: alert, oriented, appears well and in no acute distress  HEENT: atraumatic, conjunttiva clear, no obvious abnormalities on inspection of external nose and ears  NECK: normal movements of the head and neck  LUNGS: on inspection no signs of respiratory distress, breathing rate appears normal, no obvious gross SOB, gasping or wheezing  CV: no obvious cyanosis  MS: moves all visible extremities without noticeable abnormality  PSYCH/NEURO: pleasant and cooperative, , speech and thought processing grossly intact appears somewhat distressed emotional   anxious  but  Nl speech  And  conversation ASSESSMENT AND PLAN:  Discussed the following assessment and plan:  Anxiety  Stress    Reviewed her record and was on Cymbalta at some point and did not feel it was agreed with her and may have had some kind of side effects from Lexapro also. Is willing to try citalopram consider Effexor because her daughter does well on it but at this time would try low-dose citalopram first.  In addition  discussed benzos can be used as an add-on for anxiety panic. Also discussed strategies and prioritization.  I suspect her decreased ability to learn new technology is from her anxiety and stress.   If tolerated may inc to 10 mg  At next check or as needed Also pay attention to getting enough sleep. Plan follow-up with visit or Web X in about 3  weeks my chart messages as needed before then.  Expectant management Risk-benefit of medicine restarting a super low-dose of citalopram may be able to increase to 10 mg at some point No obvious significant depression at this time. I discussed the assessment and treatment plan with the patient. The patient was provided an opportunity to ask questions and all were answered. The patient agreed with the plan and demonstrated an understanding of the instructions.   The patient was advised to call back or seek an in-person evaluation if the symptoms worsen or if the condition fails to improve as anticipated.  I provided 27 minutes of non-face-to-face time during this encounter. Video visit    Shanon Ace, MD

## 2018-05-27 NOTE — Progress Notes (Signed)
Virtual Visit via Video Note  I connected with@ on 05/28/18 at  8:30 AM EDT by a video enabled telemedicine application and verified that I am speaking with the correct person using two identifiers. Location patient: home Location provider:work office Persons participating in the virtual visit: patient, provider  [place] national recommendations  regarding COVID 19 pandemic   video visit is advised over in office visit for this patient.  Discussed the limitations of evaluation and management by telemedicine and  availability of in person appointments. The patient expressed understanding and agreed to proceed.   HPI: Therapist, nutritional for a couple of problems  And fu   TICK bite  Removed by husband 3 days ago and thea rea is red and itchy no systemic sx  No  Bullseye  No travel asks for advice   Has meneiers and is on  Diuretic daily with options to do qod  As needed valium 5 q8 hours prn vertigo disp 10  and Zofran if needed for attack but hasn't needed for ? A y ear   Last rx was dec 19   But ENT  Dr Thornell Mule closed is office and will be with [facility] in [place] . Stable  And hearing some tinnitus right but gets audiology check and no progression. Asks  I can manage meds at this time   In  [place]  of trying to get in with new  ent at this time .   hasn't had  her fu lfts but has been well  At home no travel  Citalopram 5 mg seems to be helping her and anxiety seems controlled hasn't touched the lorazepam but has it.    ROS: See pertinent positives and negatives per HPI. No cv sx   Past Medical History:  Diagnosis Date  . 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 Arthritis   . Cataract    developing  . Femur fracture, right ([place]) 2003  . Fibroids   . Gilbert's syndrome   . History of liver biopsy nl    records of fatty liver 2008 not confirmed   . Hx of adenomatous colonic polyps   . Meniere disease     Right ear  . Osteoporosis   . Osteoporosis   . Other hemochromatosis    heterozygosity for h63d gene  . Spontaneous abortion     Past Surgical History:  Procedure Laterality Date  . BREAST CYST EXCISION Right   . [place]  . COLONOSCOPY  220, 2004, 2007, 08/13/2010   2002: 8 mm polyp 2004: no polyps 2007: no polyps 2012: hemorrhoids  . hosp r/o mi cv right chest and jaw pain  2010  . LIVER BIOPSY     2015 negative   no fatty liver  records  2008 said fatty liver    . OVARIAN CYST REMOVAL    . POLYPECTOMY    . spontaneously abortion on 3rd preg     triploid dna on path    Family History  Problem Relation Age of Onset  . Colon cancer Father        dx'd late to mid 6's  . Other Father        tinnitus  . Heart attack Father   . Depression Mother   . Anxiety disorder Mother   . Dementia Mother   . Other Mother        high calcium  .  Hearing loss Mother        death  . Pulmonary disease Mother   . Stroke Other        silent  . Other Brother        elevated LFTs  . Healthy Daughter   . Healthy Daughter   . Colon polyps Neg Hx   . Esophageal cancer Neg Hx   . Rectal cancer Neg Hx   . Stomach cancer Neg Hx     Social History   Tobacco Use  . Smoking status: Never Smoker  . Smokeless tobacco: Never Used  Substance Use Topics  . Alcohol use: Yes    Alcohol/week: 1.0 - 2.0 standard drinks    Types: 1 - 2 Glasses of wine per week    Comment: glass of wine a week  . Drug use: No      Current Outpatient Medications:  .  Calcium Carb-Cholecalciferol (CALCIUM 600/VITAMIN D3) 600-800 MG-UNIT TABS, Take 1 tablet by mouth daily., Disp: , Rfl:  .  chlorthalidone (HYGROTON) 25 MG tablet, Take 25 mg by mouth daily., Disp: , Rfl:  .  cholecalciferol (VITAMIN D3) 25 MCG (1000 UT) tablet, Take 1,000 Units by mouth daily., Disp: , Rfl:  .  citalopram (CELEXA) 10 MG tablet, Take 0.5 tablets (5 mg total) by mouth daily., Disp: 30 tablet, Rfl: 1 .  diazepam  (VALIUM) 5 MG tablet, , Disp: , Rfl:  .  TURMERIC PO, Take by mouth daily., Disp: , Rfl:  .  Vitamin D, Ergocalciferol, (DRISDOL) 1.25 MG (50000 UT) CAPS capsule, TAKE 1 CAPSULE (50,000 UNITS TOTAL) BY MOUTH EVERY 7 (SEVEN) DAYS., Disp: 12 capsule, Rfl: 0 .  Diclofenac Sodium (PENNSAID TD), Place onto the skin as needed., Disp: , Rfl:  .  diclofenac sodium (VOLTAREN) 1 % GEL, Apply 3 g to 3 large joints up to 3 times a day. (Patient not taking: Reported on 03/06/2018), Disp: 3 Tube, Rfl: 3 .  gabapentin (NEURONTIN) 100 MG capsule, TAKE 2 CAPSULES (200 MG TOTAL) BY MOUTH AT BEDTIME. (Patient not taking: Reported on 05/28/2018), Disp: 180 capsule, Rfl: 2 .  LORazepam (ATIVAN) 0.5 MG tablet, 1-2 po if needed for anxiety up to every 8 hours (Patient not taking: Reported on 05/28/2018), Disp: 24 tablet, Rfl: 0 .  ondansetron (ZOFRAN-ODT) 4 MG disintegrating tablet, Take 4 mg by mouth every 8 (eight) hours as needed. , Disp: , Rfl: 1  Current Facility-Administered Medications:  .  0.9 %  sodium chloride infusion, 500 mL, Intravenous, Once, Gatha Mayer, MD  EXAM: BP Readings from Last 3 Encounters:  03/20/18 103/70  12/30/17 104/60  12/12/17 (!) 86/58    VITALS per patient if applicable: looks well   GENERAL: alert, oriented, appears well and in no acute distress  HEENT: atraumatic, conjunttiva clear, no obvious abnormalities on inspection of external nose and ears  NECK: normal movements of the head and neck  LUNGS: on inspection no signs of respiratory distress, breathing rate appears normal, no obvious gross SOB, gasping or wheezing  CV: no obvious cyanosis Skin back trunk small 1-2 cm redness with central bite  No bullseye  Non icteric  MS: moves all visible extremities without noticeable abnormality  PSYCH/NEURO: pleasant and cooperative, no obvious depression or anxiety, speech and thought processing grossly intact nl affect much more relaxed than last visit  Lab Results  Component  Value Date   WBC 3.5 (L) 10/13/2017   HGB 12.6 10/13/2017   HCT 35.8 (L) 10/13/2017  PLT 202.0 10/13/2017   GLUCOSE 91 10/13/2017   CHOL 206 (H) 10/13/2017   TRIG 81.0 10/13/2017   HDL 66.50 10/13/2017   LDLDIRECT 129.1 02/16/2013   LDLCALC 123 (H) 10/13/2017   ALT 20 10/13/2017   AST 20 10/13/2017   NA 140 10/13/2017   K 4.1 10/13/2017   CL 100 10/13/2017   CREATININE 1.01 10/13/2017   BUN 19 10/13/2017   CO2 34 (H) 10/13/2017   TSH 1.38 12/06/2015   INR 0.92 06/23/2013   HGBA1C 5.1 09/21/2008    ASSESSMENT AND PLAN:  Discussed the following assessment and plan:  Meniere's disease, unspecified laterality - Plan: Basic metabolic panel, CBC with Differential/Platelet, Hepatic function panel, Lipid panel, TSH, VITAMIN D 25 Hydroxy (Vit-D Deficiency, Fractures)  Medication management - Plan: Basic metabolic panel, CBC with Differential/Platelet, Hepatic function panel, Lipid panel, TSH, VITAMIN D 25 Hydroxy (Vit-D Deficiency, Fractures)  Tick bite, initial encounter  Osteoporosis without current pathological fracture, unspecified osteoporosis type - Plan: Basic metabolic panel, CBC with Differential/Platelet, Hepatic function panel, Lipid panel, TSH, VITAMIN D 25 Hydroxy (Vit-D Deficiency, Fractures)  Hemochromatosis carrier - Plan: Basic metabolic panel, CBC with Differential/Platelet, Hepatic function panel, Lipid panel, TSH, VITAMIN D 25 Hydroxy (Vit-D Deficiency, Fractures), IBC + Ferritin  On potassium wasting diuretic therapy - Plan: Basic metabolic panel, CBC with Differential/Platelet, Hepatic function panel, Lipid panel, TSH, VITAMIN D 25 Hydroxy (Vit-D Deficiency, Fractures)  Vitamin D deficiency - hx osteoporosis rx  - Plan: Basic metabolic panel, CBC with Differential/Platelet, Hepatic function panel, Lipid panel, TSH, VITAMIN D 25 Hydroxy (Vit-D Deficiency, Fractures)  Elevated LFTs - Plan: Basic metabolic panel, CBC with Differential/Platelet, Hepatic function  panel, Lipid panel, TSH, VITAMIN D 25 Hydroxy (Vit-D Deficiency, Fractures) Local rx tick bite  Sx zHCS if needed and observe for alarm sx  Counseled.   Expectant management and discussion of plan and treatment with patient with opportunity to ask questions and all were answered. The patient agreed with the plan and demonstrated an understanding of the instructions.  contact us when needs med refill   Ok to manage med for menieres at this time    If needed stable   Mood stable and can continue medication citalopram  Plan future labs and exam  In last summer or sept The patient was advised to call back or seek an in-person evaluation if worsening  or having concerns .     Shanon Ace, MD

## 2018-05-28 ENCOUNTER — Ambulatory Visit (INDEPENDENT_AMBULATORY_CARE_PROVIDER_SITE_OTHER): Payer: PPO | Admitting: Internal Medicine

## 2018-05-28 ENCOUNTER — Encounter: Payer: Self-pay | Admitting: Internal Medicine

## 2018-05-28 ENCOUNTER — Other Ambulatory Visit: Payer: Self-pay

## 2018-05-28 ENCOUNTER — Telehealth: Payer: Self-pay

## 2018-05-28 DIAGNOSIS — H8109 Meniere's disease, unspecified ear: Secondary | ICD-10-CM | POA: Diagnosis not present

## 2018-05-28 DIAGNOSIS — R7989 Other specified abnormal findings of blood chemistry: Secondary | ICD-10-CM

## 2018-05-28 DIAGNOSIS — E559 Vitamin D deficiency, unspecified: Secondary | ICD-10-CM

## 2018-05-28 DIAGNOSIS — Z148 Genetic carrier of other disease: Secondary | ICD-10-CM

## 2018-05-28 DIAGNOSIS — W57XXXA Bitten or stung by nonvenomous insect and other nonvenomous arthropods, initial encounter: Secondary | ICD-10-CM

## 2018-05-28 DIAGNOSIS — M810 Age-related osteoporosis without current pathological fracture: Secondary | ICD-10-CM | POA: Diagnosis not present

## 2018-05-28 DIAGNOSIS — R945 Abnormal results of liver function studies: Secondary | ICD-10-CM

## 2018-05-28 DIAGNOSIS — Z79899 Other long term (current) drug therapy: Secondary | ICD-10-CM | POA: Diagnosis not present

## 2018-05-28 NOTE — Telephone Encounter (Signed)
lvm for pt  To call back to schedule lab and cpe in September

## 2018-06-08 ENCOUNTER — Other Ambulatory Visit: Payer: Self-pay | Admitting: Internal Medicine

## 2018-07-01 NOTE — Telephone Encounter (Signed)
Ok to take 10 mg per day and refill if needed for 90 days   bnot sure what happened with refills  But please refill the chlorthalidone for 90 days with 1 refill She is due for  Labs in summer and the orders are in place

## 2018-07-02 MED ORDER — CHLORTHALIDONE 25 MG PO TABS: 25.0000 mg | ORAL_TABLET | Freq: Every day | ORAL | 0 refills | Status: DC

## 2018-07-14 ENCOUNTER — Ambulatory Visit (INDEPENDENT_AMBULATORY_CARE_PROVIDER_SITE_OTHER): Payer: PPO | Admitting: Family Medicine

## 2018-07-14 ENCOUNTER — Other Ambulatory Visit: Payer: Self-pay

## 2018-07-14 DIAGNOSIS — R21 Rash and other nonspecific skin eruption: Secondary | ICD-10-CM | POA: Diagnosis not present

## 2018-07-14 DIAGNOSIS — W57XXXA Bitten or stung by nonvenomous insect and other nonvenomous arthropods, initial encounter: Secondary | ICD-10-CM

## 2018-07-14 DIAGNOSIS — S80862A Insect bite (nonvenomous), left lower leg, initial encounter: Secondary | ICD-10-CM

## 2018-07-14 MED ORDER — DOXYCYCLINE HYCLATE 100 MG PO CAPS: 100.0000 mg | ORAL_CAPSULE | Freq: Two times a day (BID) | ORAL | 0 refills | Status: DC

## 2018-07-14 NOTE — Progress Notes (Signed)
Patient ID: Donna Warren, female   DOB: 1950/06/18, 68 y.o.   MRN: [id number]  This visit type was conducted due to national recommendations for restrictions regarding the COVID-19 pandemic in an effort to limit this patient's exposure and mitigate transmission in our community.   Virtual Visit via Video Note  I connected with Donna Warren on 07/14/18 at  3:45 PM EDT by a video enabled telemedicine application and verified that I am speaking with the correct person using two identifiers.  Location patient: home Location provider:work or home office Persons participating in the virtual visit: patient, provider  I discussed the limitations of evaluation and management by telemedicine and the availability of in person appointments. The patient expressed understanding and agreed to proceed.   HPI: Patient is seen following tick bite she first noted on "Sunday around 3 PM.  She does a lot of hiking and was out hiking on Saturday and Sunday.  She noticed a tick on her left lower leg.  She feels that she was able to remove the tick entirely.  She now has a red ring which is spreading around the area of the bite.  She has some itching.  No fever.  No headache.  No arthralgias.  No other skin rash noted.  She is not sure what type of tick but states this was fairly small.  Her concern is that she is getting ready to head out of town to [place] to visit her daughter and granddaughter.  She has been quarantined strictly for the past 10 days in preparation for that   ROS: See pertinent positives and negatives per HPI.  Past Medical History:  Diagnosis Date  . 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 Arthritis   . Cataract    developing  . Femur fracture, right ([facility]) 2003  . Fibroids   . Gilbert's syndrome   . History of liver biopsy nl    records of fatty liver 2008 not confirmed   . Hx of adenomatous colonic polyps    . Meniere disease    Right ear  . Osteoporosis   . Osteoporosis   . Other hemochromatosis    heterozygosity for h63d gene  . Spontaneous abortion     Past Surgical History:  Procedure Laterality Date  . BREAST CYST EXCISION Right   . CESAREAN SECTION  1982  . COLONOSCOPY  220, 2004, 2007, 08/13/2010   2002: 8 mm polyp 2004: no polyps 2007: no polyps 2012: hemorrhoids  . hosp r/o mi cv right chest and jaw pain  2010  . LIVER BIOPSY     20" 15 negative   no fatty liver  records  2008 said fatty liver    . OVARIAN CYST REMOVAL    . POLYPECTOMY    . spontaneously abortion on 3rd preg     triploid dna on path    Family History  Problem Relation Age of Onset  . Colon cancer Father        dx'd late to mid 76's  . Other Father        tinnitus  . Heart attack Father   . Depression Mother   . Anxiety disorder Mother   . Dementia Mother   . Other Mother        high calcium  . Hearing loss Mother        death  . Pulmonary disease Mother   . Stroke Other  silent  . Other Brother        elevated LFTs  . Healthy Daughter   . Healthy Daughter   . Colon polyps Neg Hx   . Esophageal cancer Neg Hx   . Rectal cancer Neg Hx   . Stomach cancer Neg Hx     SOCIAL HX: Non-smoker   Current Outpatient Medications:  .  Calcium Carb-Cholecalciferol (CALCIUM 600/VITAMIN D3) 600-800 MG-UNIT TABS, Take 1 tablet by mouth daily., Disp: , Rfl:  .  chlorthalidone (HYGROTON) 25 MG tablet, Take 1 tablet (25 mg total) by mouth daily., Disp: 90 tablet, Rfl: 0 .  cholecalciferol (VITAMIN D3) 25 MCG (1000 UT) tablet, Take 1,000 Units by mouth daily., Disp: , Rfl:  .  citalopram (CELEXA) 10 MG tablet, TAKE 1/2 TABLET BY MOUTH DAILY, Disp: 45 tablet, Rfl: 2 .  diazepam (VALIUM) 5 MG tablet, , Disp: , Rfl:  .  Diclofenac Sodium (PENNSAID TD), Place onto the skin as needed., Disp: , Rfl:  .  diclofenac sodium (VOLTAREN) 1 % GEL, Apply 3 g to 3 large joints up to 3 times a day. (Patient not  taking: Reported on 03/06/2018), Disp: 3 Tube, Rfl: 3 .  doxycycline (VIBRAMYCIN) 100 MG capsule, Take 1 capsule (100 mg total) by mouth 2 (two) times daily., Disp: 20 capsule, Rfl: 0 .  gabapentin (NEURONTIN) 100 MG capsule, TAKE 2 CAPSULES (200 MG TOTAL) BY MOUTH AT BEDTIME. (Patient not taking: Reported on 05/28/2018), Disp: 180 capsule, Rfl: 2 .  LORazepam (ATIVAN) 0.5 MG tablet, 1-2 po if needed for anxiety up to every 8 hours (Patient not taking: Reported on 05/28/2018), Disp: 24 tablet, Rfl: 0 .  ondansetron (ZOFRAN-ODT) 4 MG disintegrating tablet, Take 4 mg by mouth every 8 (eight) hours as needed. , Disp: , Rfl: 1 .  TURMERIC PO, Take by mouth daily., Disp: , Rfl:  .  Vitamin D, Ergocalciferol, (DRISDOL) 1.25 MG (50000 UT) CAPS capsule, TAKE 1 CAPSULE (50,000 UNITS TOTAL) BY MOUTH EVERY 7 (SEVEN) DAYS., Disp: 12 capsule, Rfl: 0  Current Facility-Administered Medications:  .  0.9 %  sodium chloride infusion, 500 mL, Intravenous, Once, Gatha Mayer, MD  EXAM:  VITALS per patient if applicable:  GENERAL: alert, oriented, appears well and in no acute distress  HEENT: atraumatic, conjunttiva clear, no obvious abnormalities on inspection of external nose and ears  NECK: normal movements of the head and neck  LUNGS: on inspection no signs of respiratory distress, breathing rate appears normal, no obvious gross SOB, gasping or wheezing  CV: no obvious cyanosis  MS: moves all visible extremities without noticeable abnormality  PSYCH/NEURO: pleasant and cooperative, no obvious depression or anxiety, speech and thought processing grossly intact  Skin: She has small punctate area left lower leg and what appears to be erythematous ring spreading out about 2 to 3 cm.  ASSESSMENT AND PLAN:  Discussed the following assessment and plan:  Tick bite.  Patient does have some local rash and advancing reddish ring.  She does not have any fever or arthralgias.  Although risk for erythema migrans  is relatively low versus allergic reaction we elected to cover with doxycycline especially since she is heading out of town tomorrow for several days.  She is penicillin allergic  -Start doxycycline 100 mg twice daily for 10 days.  She is cautioned about potential photosensitivity -Follow-up for any fever or worsening symptoms     I discussed the assessment and treatment plan with the patient. The patient was provided  an opportunity to ask questions and all were answered. The patient agreed with the plan and demonstrated an understanding of the instructions.   The patient was advised to call back or seek an in-person evaluation if the symptoms worsen or if the condition fails to improve as anticipated.   Carolann Littler, MD

## 2018-07-30 MED ORDER — CITALOPRAM HYDROBROMIDE 10 MG PO TABS: 10.0000 mg | ORAL_TABLET | Freq: Every day | ORAL | 1 refills | Status: DC

## 2018-07-30 NOTE — Telephone Encounter (Signed)
Please send in 10 mg citalopram disp 90 refill x 1 as requested

## 2018-09-21 NOTE — Telephone Encounter (Signed)
Okay for early labs. If so, what labs should be placed?

## 2018-09-22 ENCOUNTER — Other Ambulatory Visit: Payer: Self-pay | Admitting: Internal Medicine

## 2018-09-22 DIAGNOSIS — Z1231 Encounter for screening mammogram for malignant neoplasm of breast: Secondary | ICD-10-CM

## 2018-09-24 NOTE — Telephone Encounter (Signed)
Please make her a cpx appt  And  make her a  Lab  appt   Orders are already in the system      Lab   to be done before visit ( no rush)

## 2018-09-25 ENCOUNTER — Other Ambulatory Visit: Payer: Self-pay

## 2018-09-25 ENCOUNTER — Ambulatory Visit
Admission: RE | Admit: 2018-09-25 | Discharge: 2018-09-25 | Disposition: A | Discharge status: Home / Self Care | Payer: PPO | Source: Ambulatory Visit | Attending: Internal Medicine | Admitting: Internal Medicine

## 2018-09-25 DIAGNOSIS — Z1231 Encounter for screening mammogram for malignant neoplasm of breast: Secondary | ICD-10-CM

## 2018-09-26 ENCOUNTER — Other Ambulatory Visit: Payer: Self-pay | Admitting: Internal Medicine

## 2018-10-05 ENCOUNTER — Other Ambulatory Visit: Payer: Self-pay

## 2018-10-07 ENCOUNTER — Other Ambulatory Visit: Payer: Self-pay

## 2018-10-07 ENCOUNTER — Other Ambulatory Visit: Payer: PPO

## 2018-10-07 DIAGNOSIS — R6889 Other general symptoms and signs: Secondary | ICD-10-CM | POA: Diagnosis not present

## 2018-10-07 DIAGNOSIS — Z20822 Contact with and (suspected) exposure to covid-19: Secondary | ICD-10-CM

## 2018-10-08 LAB — NOVEL CORONAVIRUS, NAA: SARS-CoV-2, NAA: NOT DETECTED

## 2018-10-13 ENCOUNTER — Telehealth: Payer: Self-pay | Admitting: Internal Medicine

## 2018-10-13 NOTE — Telephone Encounter (Signed)
Pt called stating that her daughter has just returned home after living abroad.  She is self Quarantined since Sunday. Mom was informed that the quarantine should last a full 14 days. Onset of symptoms from day 2 of exposure up to 14 days after exposure. Daughter was negative before flight and has no symptom. Mom was advised that after 14 days it is not necessary to be tested unless having symptoms, but if her daughter desired the test that our site are open to all.  She verbalized understanding of all information.

## 2018-10-17 ENCOUNTER — Ambulatory Visit: Payer: PPO

## 2018-10-23 ENCOUNTER — Ambulatory Visit: Payer: PPO | Admitting: Family Medicine

## 2018-10-27 ENCOUNTER — Encounter: Payer: PPO | Admitting: Internal Medicine

## 2018-11-04 DIAGNOSIS — H90A21 Sensorineural hearing loss, unilateral, right ear, with restricted hearing on the contralateral side: Secondary | ICD-10-CM | POA: Diagnosis not present

## 2018-11-04 DIAGNOSIS — H8101 Meniere's disease, right ear: Secondary | ICD-10-CM | POA: Diagnosis not present

## 2018-11-09 NOTE — Progress Notes (Signed)
Chief Complaint  Patient presents with  . Annual Exam    HPI: Donna Warren 68 y.o. comes in today for Preventive Medicare exam . And  Medication Chronic disease management    recent meniere flare  Episodic at times recently  Still seeing   Dr Thornell Mule office and now on prednisone  And to have fu then .  Sleeping dec .  Day 1-7 6 tabes amd then tapers. 12  Days. tapers.   recent  [place] to family  .   Needs refill citalopram  missplaced  Wants to remain on med   Needs  Fu lfts   Hx des exposures  Last pap 2018       Health Maintenance  Topic Date Due  . MAMMOGRAM  09/24/2020  . COLONOSCOPY  03/21/2023  . TETANUS/TDAP  12/06/2027  . INFLUENZA VACCINE  Completed  . DEXA SCAN  Completed  . Hepatitis C Screening  Completed  . PNA vac Low Risk Adult  Completed   Health Maintenance Review LIFESTYLE:  Exercise:   Walks a lot until recnetly   doig Tobacco/ETS: no Alcohol:   no Sugar beverages: Sleep:  About 8 hours  Plus  Drug use: no HH:  0  husband in [place]     Hearing: dec with meniers right  When flare   Vision:  No limitations at present . Last eye check UTD  Safety:  Has smoke detector and wears seat belts.  . No excess sun exposure. Sees dentist regularly.  Falls:   Slipped on mat  No major injury   Memory: Felt to be good  , no concern from her or her family.  Depression: No anhedonia unusual crying or depressive symptoms  Nutrition: Eats well balanced diet; adequate calcium and vitamin D. No swallowing chewing problems.  Injury: no major injuries in the last six months.  Other healthcare providers:  Reviewed today .  Preventive parameters: up-to-date  Reviewed   ADLS:   There are no problems or need for assistance  driving, feeding, obtaining food, dressing, toileting and bathing, managing money using phone. She is independent.     ROS:  GEN/ HEENT: No fever, significant weight changes sweats headaches vision problems , CV/ PULM; No  chest pain shortness of breath cough, syncope,edema  change in exercise tolerance. GI /GU: No adominal pain, vomiting, change in bowel habits. No blood in the stool. No significant GU symptoms. SKIN/HEME: ,no acute skin rashes suspicious lesions or bleeding. No lymphadenopathy, nodules, masses.  NEURO/ PSYCH:  No neurologic signs such as weakness numbness. No depression anxiety. IMM/ Allergy: No unusual infections.  Allergy .   REST of 12 system review negative except as per HPI   Past Medical History:  Diagnosis Date  . 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 Arthritis   . Cataract    developing  . Femur fracture, right ([facility]) 2003  . Fibroids   . Gilbert's syndrome   . History of liver biopsy nl    records of fatty liver 2008 not confirmed   . Hx of adenomatous colonic polyps   . Meniere disease    Right ear  . Osteoporosis   . Osteoporosis   . Other hemochromatosis    heterozygosity for h63d gene  . Spontaneous abortion     Family History  Problem Relation Age of Onset  . Colon cancer Father  dx'd late to mid 70's  . Other Father        tinnitus  . Heart attack Father   . Depression Mother   . Anxiety disorder Mother   . Dementia Mother   . Other Mother        high calcium  . Hearing loss Mother        death  . Pulmonary disease Mother   . Stroke Other        silent  . Other Brother        elevated LFTs  . Healthy Daughter   . Healthy Daughter   . Colon polyps Neg Hx   . Esophageal cancer Neg Hx   . Rectal cancer Neg Hx   . Stomach cancer Neg Hx   . Breast cancer Neg Hx     Social History   Socioeconomic History  . Marital status: Married    Spouse name: Not on file  . Number of children: Not on file  . Years of education: Not on file  . Highest education level: Not on file  Occupational History  . Not on file  Social Needs  . Financial resource strain: Not  on file  . Food insecurity    Worry: Not on file    Inability: Not on file  . Transportation needs    Medical: Not on file    Non-medical: Not on file  Tobacco Use  . Smoking status: Never Smoker  . Smokeless tobacco: Never Used  Substance and Sexual Activity  . Alcohol use: Yes    Alcohol/week: 1.0 - 2.0 standard drinks    Types: 1 - 2 Glasses of wine per week    Comment: glass of wine a week  . Drug use: No  . Sexual activity: Not on file  Lifestyle  . Physical activity    Days per week: Not on file    Minutes per session: Not on file  . Stress: Not on file  Relationships  . Social Herbalist on phone: Not on file    Gets together: Not on file    Attends religious service: Not on file    Active member of club or organization: Not on file    Attends meetings of clubs or organizations: Not on file    Relationship status: Not on file  Other Topics Concern  . Not on file  Social History Narrative        2 daughters   Husband a [employer] director  Working 59 - 27.  Travels    Daily caffeine use   Sleep  Often disrupted.    Alcohol about once a week.   Hof 2 pet dogs    Outpatient Encounter Medications as of 11/10/2018  Medication Sig  . Calcium Carb-Cholecalciferol (CALCIUM 600/VITAMIN D3) 600-800 MG-UNIT TABS Take 1 tablet by mouth daily.  . chlorthalidone (HYGROTON) 25 MG tablet TAKE 1 TABLET BY MOUTH EVERY DAY  . cholecalciferol (VITAMIN D3) 25 MCG (1000 UT) tablet Take 1,000 Units by mouth daily.  . citalopram (CELEXA) 10 MG tablet Take 1 tablet (10 mg total) by mouth daily.  . diazepam (VALIUM) 5 MG tablet   . Diclofenac Sodium (PENNSAID TD) Place onto the skin as needed.  . diclofenac sodium (VOLTAREN) 1 % GEL Apply 3 g to 3 large joints up to 3 times a day.  . gabapentin (NEURONTIN) 100 MG capsule TAKE 2 CAPSULES (200 MG TOTAL)  BY MOUTH AT BEDTIME.  . LORazepam (ATIVAN) 0.5 MG tablet 1-2 po if needed for anxiety up to  every 8 hours  . ondansetron (ZOFRAN-ODT) 4 MG disintegrating tablet Take 4 mg by mouth every 8 (eight) hours as needed.   . TURMERIC PO Take by mouth daily.  . Vitamin D, Ergocalciferol, (DRISDOL) 1.25 MG (50000 UT) CAPS capsule TAKE 1 CAPSULE (50,000 UNITS TOTAL) BY MOUTH EVERY 7 (SEVEN) DAYS.  . [DISCONTINUED] citalopram (CELEXA) 10 MG tablet Take 1 tablet (10 mg total) by mouth daily.  . [DISCONTINUED] doxycycline (VIBRAMYCIN) 100 MG capsule Take 1 capsule (100 mg total) by mouth 2 (two) times daily.   Facility-Administered Encounter Medications as of 11/10/2018  Medication  . 0.9 %  sodium chloride infusion    EXAM:  BP 118/70 (BP Location: Right Arm, Patient Position: Sitting, Cuff Size: Normal)   Pulse 85   Temp 97.6 F (36.4 C) (Temporal)   Ht 5\' 4"  (1.626 m)   Wt 123 lb 6.4 oz (56 kg)   SpO2 97%   BMI 21.18 kg/m   Body mass index is 21.18 kg/m.  Physical Exam: Vital signs reviewed [id number] is a well-developed well-nourished alert cooperative   who appears stated age in no acute distress.  HEENT: normocephalic atraumatic , Eyes: PERRL EOM's full, conjunctiva clear,  Ears: no deformity EAC's clear TMs with normal landmarks. Mouth: clear OP,masked NECK: supple without masses, thyromegaly or bruits. CHEST/PULM:  Clear to auscultation and percussion breath sounds equal no wheeze , rales or rhonchi. No chest wall deformities or tenderness.Breast: normal by inspection . No dimpling, discharge, masses, tenderness or discharge .wellhealed scar right  CV: PMI is nondisplaced, S1 S2 no gallops, murmurs, rubs. Peripheral pulses are full without delay.No JVD .  ABDOMEN: Bowel sounds normal nontender  No guard or rebound, no hepato splenomegal no CVA tenderness.   Extremtities:  No clubbing cyanosis or edema, no acute joint swelling or redness no focal atrophy NEURO:  Oriented x3, cranial nerves 3-12 appear to be intact, no obvious focal weakness,gait within normal limits no abnormal  reflexes or asymmetrical SKIN: No acute rashes normal turgor, color, no bruising or petechiae. PSYCH: Oriented, good eye contact, no obvious depression anxiety, cognition and judgment appear normal. LN: no cervical axillary inguinal adenopathy No noted deficits in memory, attention, and speech. Pelvic: NL ext GU, labia clear without lesions or rash . Vagina no lesions  Nabothian cyst at introitus but no masses .Cervix: clear  UTERUS: Neg CMT Adnexa:  clear no masses . PAP done   Lab Results  Component Value Date   WBC 8.8 11/10/2018   HGB 14.0 11/10/2018   HCT 40.3 11/10/2018   PLT 318.0 11/10/2018   GLUCOSE 92 11/10/2018   CHOL 292 (H) 11/10/2018   TRIG 84.0 11/10/2018   HDL 68.10 11/10/2018   LDLDIRECT 129.1 02/16/2013   LDLCALC 207 (H) 11/10/2018   ALT 32 11/10/2018   AST 15 11/10/2018   NA 137 11/10/2018   K 3.7 11/10/2018   CL 95 (L) 11/10/2018   CREATININE 0.98 11/10/2018   BUN 26 (H) 11/10/2018   CO2 34 (H) 11/10/2018   TSH 0.84 11/10/2018   INR 0.92 06/23/2013   HGBA1C 5.1 09/21/2008    ASSESSMENT AND PLAN:  Discussed the following assessment and plan:  Visit for preventive health examination  Medication management - Plan: Basic metabolic panel, CBC with Differential/Platelet, Hepatic function panel, Lipid panel, TSH  Meniere's disease, unspecified laterality - Plan: Basic metabolic panel, CBC  with Differential/Platelet, Hepatic function panel, Lipid panel, TSH  Hemochromatosis carrier - Plan: Basic metabolic panel, CBC with Differential/Platelet, Hepatic function panel, Lipid panel, TSH  Osteoporosis without current pathological fracture, unspecified osteoporosis type - delayed  decision about prolia  - Plan: Basic metabolic panel, CBC with Differential/Platelet, Hepatic function panel, Lipid panel, TSH  Hyperlipidemia, unspecified hyperlipidemia type - Plan: Basic metabolic panel, CBC with Differential/Platelet, Hepatic function panel, Lipid panel, TSH  Need  for immunization against influenza - Plan: Flu Vaccine QUAD High Dose(Fluad)  Screening for cervical cancer - Plan: PAP [[facility]]  Elevated LFTs  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   Refill citalopram today .     Health Maintenance, Female Adopting a healthy lifestyle and getting preventive care are important in promoting health and wellness. Ask your health care provider about:  The right schedule for you to have regular tests and exams.  Things you can do on your own to prevent diseases and keep yourself healthy. What should I know about diet, weight, and exercise? Eat a healthy diet   Eat a diet that includes plenty of vegetables, fruits, low-fat dairy products, and lean protein.  Do not eat a lot of foods that are high in solid fats, added sugars, or sodium. Maintain a healthy weight Body mass index (BMI) is used to identify weight problems. It estimates body fat based on height and weight. Your health care provider can help determine your BMI and help you achieve or maintain a healthy weight. Get regular exercise Get regular exercise. This is one of the most important things you can do for your health. Most adults should:  Exercise for at least 150 minutes each week. The exercise should increase your heart rate and make you sweat (moderate-intensity exercise).  Do strengthening exercises at least twice a week. This is in addition to the moderate-intensity exercise.  Spend less time sitting. Even light physical activity can  be beneficial. Watch cholesterol and blood lipids Have your blood tested for lipids and cholesterol at 68 years of age, then have this test every 5 years. Have your cholesterol levels checked more often if:  Your lipid or cholesterol levels are high.  You are older than 68 years of age.  You are at high risk for heart disease. What should I know about cancer screening? Depending on your health history and family history, you may need to have cancer screening at various ages. This may include screening for:  Breast cancer.  Cervical cancer.  Colorectal cancer.  Skin cancer.  Lung cancer. What should I know about heart disease, diabetes, and high blood pressure? Blood pressure and heart disease  High blood pressure causes heart disease and increases the risk of stroke. This is more likely to develop in people who have high blood pressure readings, are of African descent, or are overweight.  Have your blood pressure checked: ? Every 3-5 years if you are 59-57 years of age. ? Every year if you are 23 years old or older. Diabetes Have regular diabetes screenings. This checks your fasting blood  sugar level. Have the screening done:  Once every three years after age 17 if you are at a normal weight and have a low risk for diabetes.  More often and at a younger age if you are overweight or have a high risk for diabetes. What should I know about preventing infection? Hepatitis B If you have a higher risk for hepatitis B, you should be screened for this virus. Talk with your health care provider to find out if you are at risk for hepatitis B infection. Hepatitis C Testing is recommended for:  Everyone born from [birth year] through 1965.  Anyone with known risk factors for hepatitis C. Sexually transmitted infections (STIs)  Get screened for STIs, including gonorrhea and chlamydia, if: ? You are sexually active and are younger than 68 years of age. ? You are older than 68 years of age  and your health care provider tells you that you are at risk for this type of infection. ? Your sexual activity has changed since you were last screened, and you are at increased risk for chlamydia or gonorrhea. Ask your health care provider if you are at risk.  Ask your health care provider about whether you are at high risk for HIV. Your health care provider may recommend a prescription medicine to help prevent HIV infection. If you choose to take medicine to prevent HIV, you should first get tested for HIV. You should then be tested every 3 months for as long as you are taking the medicine. Pregnancy  If you are about to stop having your period (premenopausal) and you may become pregnant, seek counseling before you get pregnant.  Take 400 to 800 micrograms (mcg) of folic acid every day if you become pregnant.  Ask for birth control (contraception) if you want to prevent pregnancy. Osteoporosis and menopause Osteoporosis is a disease in which the bones lose minerals and strength with aging. This can result in bone fractures. If you are 41 years old or older, or if you are at risk for osteoporosis and fractures, ask your health care provider if you should:  Be screened for bone loss.  Take a calcium or vitamin D supplement to lower your risk of fractures.  Be given hormone replacement therapy (HRT) to treat symptoms of menopause. Follow these instructions at home: Lifestyle  Do not use any products that contain nicotine or tobacco, such as cigarettes, e-cigarettes, and chewing tobacco. If you need help quitting, ask your health care provider.  Do not use street drugs.  Do not share needles.  Ask your health care provider for help if you need support or information about quitting drugs. Alcohol use  Do not drink alcohol if: ? Your health care provider tells you not to drink. ? You are pregnant, may be pregnant, or are planning to become pregnant.  If you drink alcohol: ? Limit how  much you use to 0-1 drink a day. ? Limit intake if you are breastfeeding.  Be aware of how much alcohol is in your drink. In the [place] one drink equals one 12 oz bottle of beer (355 mL), one 5 oz glass of wine (148 mL), or one 1 oz glass of hard liquor (44 mL). General instructions  Schedule regular health, dental, and eye exams.  Stay current with your vaccines.  Tell your health care provider if: ? You often feel depressed. ? You have ever been abused or do not feel safe at home. Summary  Adopting a healthy lifestyle and getting  preventive care are important in promoting health and wellness.  Follow your health care provider's instructions about healthy diet, exercising, and getting tested or screened for diseases.  Follow your health care provider's instructions on monitoring your cholesterol and blood pressure. This information is not intended to replace advice given to you by your health care provider. Make sure you discuss any questions you have with your health care provider. Document Released: 07/23/2010 Document Revised: 12/31/2017 Document Reviewed: 12/31/2017 Elsevier Patient Education  2020 [place] Dessie Tatem M.D.

## 2018-11-10 ENCOUNTER — Ambulatory Visit (INDEPENDENT_AMBULATORY_CARE_PROVIDER_SITE_OTHER): Payer: PPO | Admitting: Internal Medicine

## 2018-11-10 ENCOUNTER — Other Ambulatory Visit (HOSPITAL_COMMUNITY)
Admission: RE | Admit: 2018-11-10 | Discharge: 2018-11-10 | Disposition: A | Discharge status: Home / Self Care | Payer: PPO | Source: Ambulatory Visit | Attending: Internal Medicine | Admitting: Internal Medicine

## 2018-11-10 ENCOUNTER — Encounter: Payer: Self-pay | Admitting: Internal Medicine

## 2018-11-10 ENCOUNTER — Other Ambulatory Visit: Payer: Self-pay

## 2018-11-10 VITALS — BP 118/70 | HR 85 | Temp 97.6°F | Ht 64.0 in | Wt 123.4 lb

## 2018-11-10 DIAGNOSIS — Z0000 Encounter for general adult medical examination without abnormal findings: Secondary | ICD-10-CM | POA: Diagnosis not present

## 2018-11-10 DIAGNOSIS — Z23 Encounter for immunization: Secondary | ICD-10-CM | POA: Diagnosis not present

## 2018-11-10 DIAGNOSIS — Z79899 Other long term (current) drug therapy: Secondary | ICD-10-CM | POA: Diagnosis not present

## 2018-11-10 DIAGNOSIS — R7989 Other specified abnormal findings of blood chemistry: Secondary | ICD-10-CM | POA: Diagnosis not present

## 2018-11-10 DIAGNOSIS — Z1151 Encounter for screening for human papillomavirus (HPV): Secondary | ICD-10-CM | POA: Insufficient documentation

## 2018-11-10 DIAGNOSIS — Z124 Encounter for screening for malignant neoplasm of cervix: Secondary | ICD-10-CM | POA: Diagnosis not present

## 2018-11-10 DIAGNOSIS — Z148 Genetic carrier of other disease: Secondary | ICD-10-CM | POA: Diagnosis not present

## 2018-11-10 DIAGNOSIS — E785 Hyperlipidemia, unspecified: Secondary | ICD-10-CM

## 2018-11-10 DIAGNOSIS — Z01419 Encounter for gynecological examination (general) (routine) without abnormal findings: Secondary | ICD-10-CM

## 2018-11-10 DIAGNOSIS — H8109 Meniere's disease, unspecified ear: Secondary | ICD-10-CM | POA: Diagnosis not present

## 2018-11-10 DIAGNOSIS — M810 Age-related osteoporosis without current pathological fracture: Secondary | ICD-10-CM | POA: Diagnosis not present

## 2018-11-10 DIAGNOSIS — Z9189 Other specified personal risk factors, not elsewhere classified: Secondary | ICD-10-CM | POA: Diagnosis not present

## 2018-11-10 DIAGNOSIS — Z91B Personal risk factor of exposure to diethylstilbestrol: Secondary | ICD-10-CM

## 2018-11-10 LAB — CBC WITH DIFFERENTIAL/PLATELET
Basophils Absolute: 0 10*3/uL (ref 0.0–0.1)
Basophils Relative: 0 % (ref 0.0–3.0)
Eosinophils Absolute: 0 10*3/uL (ref 0.0–0.7)
Eosinophils Relative: 0.1 % (ref 0.0–5.0)
HCT: 40.3 % (ref 36.0–46.0)
Hemoglobin: 14 g/dL (ref 12.0–15.0)
Lymphocytes Relative: 20.9 % (ref 12.0–46.0)
Lymphs Abs: 1.8 10*3/uL (ref 0.7–4.0)
MCHC: 34.8 g/dL (ref 30.0–36.0)
MCV: 95.1 fl (ref 78.0–100.0)
Monocytes Absolute: 0.5 10*3/uL (ref 0.1–1.0)
Monocytes Relative: 6.1 % (ref 3.0–12.0)
Neutro Abs: 6.4 10*3/uL (ref 1.4–7.7)
Neutrophils Relative %: 72.9 % (ref 43.0–77.0)
Platelets: 318 10*3/uL (ref 150.0–400.0)
RBC: 4.24 Mil/uL (ref 3.87–5.11)
RDW: 12.3 % (ref 11.5–15.5)
WBC: 8.8 10*3/uL (ref 4.0–10.5)

## 2018-11-10 LAB — BASIC METABOLIC PANEL
BUN: 26 mg/dL — ABNORMAL HIGH (ref 6–23)
CO2: 34 mEq/L — ABNORMAL HIGH (ref 19–32)
Calcium: 10.1 mg/dL (ref 8.4–10.5)
Chloride: 95 mEq/L — ABNORMAL LOW (ref 96–112)
Creatinine, Ser: 0.98 mg/dL (ref 0.40–1.20)
GFR: 56.44 mL/min — ABNORMAL LOW (ref >60.00)
Glucose, Bld: 92 mg/dL (ref 70–99)
Potassium: 3.7 mEq/L (ref 3.5–5.1)
Sodium: 137 mEq/L (ref 135–145)

## 2018-11-10 LAB — LIPID PANEL
Cholesterol: 292 mg/dL — ABNORMAL HIGH (ref 0–200)
HDL: 68.1 mg/dL (ref >39.00)
LDL Cholesterol: 207 mg/dL — ABNORMAL HIGH (ref 0–99)
NonHDL: 223.7
Total CHOL/HDL Ratio: 4
Triglycerides: 84 mg/dL (ref 0.0–149.0)
VLDL: 16.8 mg/dL (ref 0.0–40.0)

## 2018-11-10 LAB — HEPATIC FUNCTION PANEL
ALT: 32 U/L (ref 0–35)
AST: 15 U/L (ref 0–37)
Albumin: 4.8 g/dL (ref 3.5–5.2)
Alkaline Phosphatase: 96 U/L (ref 39–117)
Bilirubin, Direct: 0.2 mg/dL (ref 0.0–0.3)
Total Bilirubin: 1.5 mg/dL — ABNORMAL HIGH (ref 0.2–1.2)
Total Protein: 7.7 g/dL (ref 6.0–8.3)

## 2018-11-10 LAB — TSH: TSH: 0.84 u[IU]/mL (ref 0.35–4.50)

## 2018-11-10 MED ORDER — CITALOPRAM HYDROBROMIDE 10 MG PO TABS: 10.0000 mg | ORAL_TABLET | Freq: Every day | ORAL | 1 refills | Status: DC

## 2018-11-10 NOTE — Patient Instructions (Addendum)
Lab today   Prednisone can temprorarily  rasie the WBC and blood sugar  FYI.   Refill citalopram today .     Health Maintenance, Female Adopting a healthy lifestyle and getting preventive care are important in promoting health and wellness. Ask your health care provider about:  The right schedule for you to have regular tests and exams.  Things you can do on your own to prevent diseases and keep yourself healthy. What should I know about diet, weight, and exercise? Eat a healthy diet   Eat a diet that includes plenty of vegetables, fruits, low-fat dairy products, and lean protein.  Do not eat a lot of foods that are high in solid fats, added sugars, or sodium. Maintain a healthy weight Body mass index (BMI) is used to identify weight problems. It estimates body fat based on height and weight. Your health care provider can help determine your BMI and help you achieve or maintain a healthy weight. Get regular exercise Get regular exercise. This is one of the most important things you can do for your health. Most adults should:  Exercise for at least 150 minutes each week. The exercise should increase your heart rate and make you sweat (moderate-intensity exercise).  Do strengthening exercises at least twice a week. This is in addition to the moderate-intensity exercise.  Spend less time sitting. Even light physical activity can be beneficial. Watch cholesterol and blood lipids Have your blood tested for lipids and cholesterol at 68 years of age, then have this test every 5 years. Have your cholesterol levels checked more often if:  Your lipid or cholesterol levels are high.  You are older than 68 years of age.  You are at high risk for heart disease. What should I know about cancer screening? Depending on your health history and family history, you may need to have cancer screening at various ages. This may include screening for:  Breast cancer.  Cervical  cancer.  Colorectal cancer.  Skin cancer.  Lung cancer. What should I know about heart disease, diabetes, and high blood pressure? Blood pressure and heart disease  High blood pressure causes heart disease and increases the risk of stroke. This is more likely to develop in people who have high blood pressure readings, are of African descent, or are overweight.  Have your blood pressure checked: ? Every 3-5 years if you are 34-68 years of age. ? Every year if you are 46 years old or older. Diabetes Have regular diabetes screenings. This checks your fasting blood sugar level. Have the screening done:  Once every three years after age 76 if you are at a normal weight and have a low risk for diabetes.  More often and at a younger age if you are overweight or have a high risk for diabetes. What should I know about preventing infection? Hepatitis B If you have a higher risk for hepatitis B, you should be screened for this virus. Talk with your health care provider to find out if you are at risk for hepatitis B infection. Hepatitis C Testing is recommended for:  Everyone born from [birth year] through 1965.  Anyone with known risk factors for hepatitis C. Sexually transmitted infections (STIs)  Get screened for STIs, including gonorrhea and chlamydia, if: ? You are sexually active and are younger than 68 years of age. ? You are older than 68 years of age and your health care provider tells you that you are at risk for this type of infection. ?  Your sexual activity has changed since you were last screened, and you are at increased risk for chlamydia or gonorrhea. Ask your health care provider if you are at risk.  Ask your health care provider about whether you are at high risk for HIV. Your health care provider may recommend a prescription medicine to help prevent HIV infection. If you choose to take medicine to prevent HIV, you should first get tested for HIV. You should then be tested every 3  months for as long as you are taking the medicine. Pregnancy  If you are about to stop having your period (premenopausal) and you may become pregnant, seek counseling before you get pregnant.  Take 400 to 800 micrograms (mcg) of folic acid every day if you become pregnant.  Ask for birth control (contraception) if you want to prevent pregnancy. Osteoporosis and menopause Osteoporosis is a disease in which the bones lose minerals and strength with aging. This can result in bone fractures. If you are 24 years old or older, or if you are at risk for osteoporosis and fractures, ask your health care provider if you should:  Be screened for bone loss.  Take a calcium or vitamin D supplement to lower your risk of fractures.  Be given hormone replacement therapy (HRT) to treat symptoms of menopause. Follow these instructions at home: Lifestyle  Do not use any products that contain nicotine or tobacco, such as cigarettes, e-cigarettes, and chewing tobacco. If you need help quitting, ask your health care provider.  Do not use street drugs.  Do not share needles.  Ask your health care provider for help if you need support or information about quitting drugs. Alcohol use  Do not drink alcohol if: ? Your health care provider tells you not to drink. ? You are pregnant, may be pregnant, or are planning to become pregnant.  If you drink alcohol: ? Limit how much you use to 0-1 drink a day. ? Limit intake if you are breastfeeding.  Be aware of how much alcohol is in your drink. In the [place] one drink equals one 12 oz bottle of beer (355 mL), one 5 oz glass of wine (148 mL), or one 1 oz glass of hard liquor (44 mL). General instructions  Schedule regular health, dental, and eye exams.  Stay current with your vaccines.  Tell your health care provider if: ? You often feel depressed. ? You have ever been abused or do not feel safe at home. Summary  Adopting a healthy lifestyle and getting  preventive care are important in promoting health and wellness.  Follow your health care provider's instructions about healthy diet, exercising, and getting tested or screened for diseases.  Follow your health care provider's instructions on monitoring your cholesterol and blood pressure. This information is not intended to replace advice given to you by your health care provider. Make sure you discuss any questions you have with your health care provider. Document Released: 07/23/2010 Document Revised: 12/31/2017 Document Reviewed: 12/31/2017 Elsevier Patient Education  2020 [employer].

## 2018-11-12 LAB — CYTOLOGY - PAP
Comment: NEGATIVE
Diagnosis: NEGATIVE
High risk HPV: NEGATIVE

## 2018-11-13 NOTE — Progress Notes (Deleted)
Office Visit Note  Patient: Donna Warren             Date of Birth: Jun 04, 1950           MRN: [id number]             PCP: Burnis Medin, MD Referring: Burnis Medin, MD Visit Date: 11/27/2018 Occupation: @GUAROCC @  Subjective:  No chief complaint on file.   History of Present Illness: Donna Warren is a 68 y.o. female ***   Activities of Daily Living:  Patient reports morning stiffness for *** {minute/hour:19697}.   Patient {ACTIONS;DENIES/REPORTS:21021675::"Denies"} nocturnal pain.  Difficulty dressing/grooming: {ACTIONS;DENIES/REPORTS:21021675::"Denies"} Difficulty climbing stairs: {ACTIONS;DENIES/REPORTS:21021675::"Denies"} Difficulty getting out of chair: {ACTIONS;DENIES/REPORTS:21021675::"Denies"} Difficulty using hands for taps, buttons, cutlery, and/or writing: {ACTIONS;DENIES/REPORTS:21021675::"Denies"}  No Rheumatology ROS completed.   PMFS History:  Patient Active Problem List   Diagnosis Date Noted  . Radial head fracture 10/23/2017  . 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 Primary osteoarthritis of both hands 06/05/2016  . DJD (degenerative joint disease), cervical 05/08/2016  . Female genital lesion 08/12/2014  . Osteoporosis 07/20/2014  . Elevated LFTs 05/21/2013  . 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 Meniere's disease 10/14/2011  . Skin lesion 08/14/2011  . Right shoulder strain 08/13/2011  . Phlebitis, superficial 08/16/2010  . DECREASED HEARING, RIGHT EAR 11/08/2009  . PROBLEMS WITH HEARING 11/08/2009  . DYSLIPIDEMIA 10/07/2008  . Gilbert syndrome 10/26/2007  . FIBROIDS, UTERUS 05/29/2007  . LIVER HEMANGIOMA 05/29/2007  . COLONIC POLYPS, HX OF 05/29/2007    Past Medical History:  Diagnosis Date  . 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 Arthritis   . Cataract     developing  . Femur fracture, right ([place]) 2003  . Fibroids   . Gilbert's syndrome   . History of liver biopsy nl    records of fatty liver 2008 not confirmed   . Hx of adenomatous colonic polyps   . Meniere disease    Right ear  . Osteoporosis   . Osteoporosis   . Other hemochromatosis    heterozygosity for h63d gene  . Spontaneous abortion     Family History  Problem Relation Age of Onset  . Colon cancer Father        dx'd late to mid 47's  . Other Father        tinnitus  . Heart attack Father   . Depression Mother   . Anxiety disorder Mother   . Dementia Mother   . Other Mother        high calcium  . Hearing loss Mother        death  . Pulmonary disease Mother   . Stroke Other        silent  . Other Brother        elevated LFTs  . Healthy Daughter   . Healthy Daughter   . Colon polyps Neg Hx   . Esophageal cancer Neg Hx   . Rectal cancer Neg Hx   . Stomach cancer Neg Hx   . Breast cancer Neg Hx    Past Surgical History:  Procedure Laterality Date  . BREAST CYST EXCISION Right   . [place]  . COLONOSCOPY  220, 2004, 2007, 08/13/2010   2002: 8 mm polyp 2004: no polyps 2007: no polyps 2012: hemorrhoids  . hosp r/o mi cv  right chest and jaw pain  2010  . LIVER BIOPSY     2015 negative   no fatty liver  records  2008 said fatty liver    . OVARIAN CYST REMOVAL    . POLYPECTOMY    . spontaneously abortion on 3rd preg     triploid dna on path   Social History   Social History Narrative        2 daughters   Husband a [employer] director  Working 107 - 61.  Travels    Daily caffeine use   Sleep  Often disrupted.    Alcohol about once a week.   [place] 2 pet dogs   Immunization History  Administered Date(s) Administered  . Fluad Quad(high Dose 65+) 11/10/2018  . Influenza Split 10/20/2012, 10/12/2013, 10/12/2014  . Influenza, High Dose Seasonal PF 12/11/2016, 10/23/2017  . Pneumococcal Conjugate-13 12/06/2015   . Pneumococcal Polysaccharide-23 12/11/2016  . Td 06/19/2007  . Tdap 12/05/2017  . Zoster Recombinat (Shingrix) 02/10/2017, 04/21/2017     Objective: Vital Signs: There were no vitals taken for this visit.   Physical Exam   Musculoskeletal Exam: ***  CDAI Exam: CDAI Score: - Patient Global: -; Provider Global: - Swollen: -; Tender: - Joint Exam   No joint exam has been documented for this visit   There is currently no information documented on the homunculus. Go to the Rheumatology activity and complete the homunculus joint exam.  Investigation: No additional findings.  Imaging: No results found.  Recent Labs: Lab Results  Component Value Date   WBC 8.8 11/10/2018   HGB 14.0 11/10/2018   PLT 318.0 11/10/2018   NA 137 11/10/2018   K 3.7 11/10/2018   CL 95 (L) 11/10/2018   CO2 34 (H) 11/10/2018   GLUCOSE 92 11/10/2018   BUN 26 (H) 11/10/2018   CREATININE 0.98 11/10/2018   BILITOT 1.5 (H) 11/10/2018   ALKPHOS 96 11/10/2018   AST 15 11/10/2018   ALT 32 11/10/2018   PROT 7.7 11/10/2018   ALBUMIN 4.8 11/10/2018   CALCIUM 10.1 11/10/2018   GFRAA 64 05/10/2016    Speciality Comments: No specialty comments available.  Procedures:  No procedures performed Allergies: Penicillins   Assessment / Plan:     Visit Diagnoses: No diagnosis found.  Orders: No orders of the defined types were placed in this encounter.  No orders of the defined types were placed in this encounter.   Face-to-face time spent with patient was *** minutes. Greater than 50% of time was spent in counseling and coordination of care.  Follow-Up Instructions: No follow-ups on file.   Earnestine Mealing, CMA  Note - This record has been created using Editor, commissioning.  Chart creation errors have been sought, but may not always  have been located. Such creation errors do not reflect on  the standard of medical care.

## 2018-11-16 ENCOUNTER — Other Ambulatory Visit: Payer: Self-pay | Admitting: *Deleted

## 2018-11-16 DIAGNOSIS — E7800 Pure hypercholesterolemia, unspecified: Secondary | ICD-10-CM

## 2018-11-18 ENCOUNTER — Other Ambulatory Visit: Payer: Self-pay | Admitting: Internal Medicine

## 2018-11-26 DIAGNOSIS — H8101 Meniere's disease, right ear: Secondary | ICD-10-CM | POA: Diagnosis not present

## 2018-11-26 DIAGNOSIS — H9041 Sensorineural hearing loss, unilateral, right ear, with unrestricted hearing on the contralateral side: Secondary | ICD-10-CM | POA: Diagnosis not present

## 2018-11-26 DIAGNOSIS — Z7289 Other problems related to lifestyle: Secondary | ICD-10-CM | POA: Diagnosis not present

## 2018-11-26 DIAGNOSIS — Z880 Allergy status to penicillin: Secondary | ICD-10-CM | POA: Diagnosis not present

## 2018-11-27 ENCOUNTER — Ambulatory Visit: Payer: Self-pay | Admitting: Rheumatology

## 2018-11-27 ENCOUNTER — Ambulatory Visit: Payer: PPO | Admitting: Rheumatology

## 2018-12-03 DIAGNOSIS — H8101 Meniere's disease, right ear: Secondary | ICD-10-CM | POA: Diagnosis not present

## 2018-12-03 DIAGNOSIS — H9041 Sensorineural hearing loss, unilateral, right ear, with unrestricted hearing on the contralateral side: Secondary | ICD-10-CM | POA: Diagnosis not present

## 2018-12-10 DIAGNOSIS — H9041 Sensorineural hearing loss, unilateral, right ear, with unrestricted hearing on the contralateral side: Secondary | ICD-10-CM | POA: Diagnosis not present

## 2018-12-10 DIAGNOSIS — H8101 Meniere's disease, right ear: Secondary | ICD-10-CM | POA: Diagnosis not present

## 2018-12-10 DIAGNOSIS — Z9621 Cochlear implant status: Secondary | ICD-10-CM | POA: Diagnosis not present

## 2018-12-31 ENCOUNTER — Other Ambulatory Visit: Payer: Self-pay

## 2019-01-01 ENCOUNTER — Other Ambulatory Visit (INDEPENDENT_AMBULATORY_CARE_PROVIDER_SITE_OTHER): Payer: PPO

## 2019-01-01 DIAGNOSIS — E7800 Pure hypercholesterolemia, unspecified: Secondary | ICD-10-CM

## 2019-01-01 LAB — LIPID PANEL
Cholesterol: 291 mg/dL — ABNORMAL HIGH (ref 0–200)
HDL: 64.6 mg/dL (ref >39.00)
LDL Cholesterol: 199 mg/dL — ABNORMAL HIGH (ref 0–99)
NonHDL: 226.54
Total CHOL/HDL Ratio: 5
Triglycerides: 138 mg/dL (ref 0.0–149.0)
VLDL: 27.6 mg/dL (ref 0.0–40.0)

## 2019-01-08 ENCOUNTER — Other Ambulatory Visit: Payer: Self-pay | Admitting: Internal Medicine

## 2019-01-08 DIAGNOSIS — H9041 Sensorineural hearing loss, unilateral, right ear, with unrestricted hearing on the contralateral side: Secondary | ICD-10-CM | POA: Diagnosis not present

## 2019-01-08 DIAGNOSIS — H8101 Meniere's disease, right ear: Secondary | ICD-10-CM | POA: Diagnosis not present

## 2019-01-08 DIAGNOSIS — H905 Unspecified sensorineural hearing loss: Secondary | ICD-10-CM | POA: Diagnosis not present

## 2019-01-08 DIAGNOSIS — H9311 Tinnitus, right ear: Secondary | ICD-10-CM | POA: Diagnosis not present

## 2019-01-11 ENCOUNTER — Encounter (HOSPITAL_COMMUNITY): Payer: Self-pay | Admitting: Emergency Medicine

## 2019-01-11 ENCOUNTER — Telehealth (HOSPITAL_COMMUNITY): Payer: Self-pay | Admitting: Emergency Medicine

## 2019-01-11 ENCOUNTER — Other Ambulatory Visit: Payer: Self-pay

## 2019-01-11 ENCOUNTER — Observation Stay (HOSPITAL_COMMUNITY)
Admission: EM | Admit: 2019-01-11 | Discharge: 2019-01-13 | Disposition: A | Discharge status: Home / Self Care | Payer: PPO | Attending: Family Medicine | Admitting: Family Medicine

## 2019-01-11 ENCOUNTER — Ambulatory Visit (INDEPENDENT_AMBULATORY_CARE_PROVIDER_SITE_OTHER): Payer: PPO | Admitting: Family Medicine

## 2019-01-11 ENCOUNTER — Encounter: Payer: Self-pay | Admitting: Family Medicine

## 2019-01-11 VITALS — BP 130/66 | HR 43 | Temp 97.6°F | Ht 64.0 in | Wt 122.0 lb

## 2019-01-11 DIAGNOSIS — H8101 Meniere's disease, right ear: Secondary | ICD-10-CM | POA: Insufficient documentation

## 2019-01-11 DIAGNOSIS — R569 Unspecified convulsions: Secondary | ICD-10-CM

## 2019-01-11 DIAGNOSIS — I10 Essential (primary) hypertension: Secondary | ICD-10-CM | POA: Diagnosis not present

## 2019-01-11 DIAGNOSIS — Z20828 Contact with and (suspected) exposure to other viral communicable diseases: Secondary | ICD-10-CM | POA: Insufficient documentation

## 2019-01-11 DIAGNOSIS — E876 Hypokalemia: Secondary | ICD-10-CM | POA: Insufficient documentation

## 2019-01-11 DIAGNOSIS — H8109 Meniere's disease, unspecified ear: Secondary | ICD-10-CM | POA: Diagnosis present

## 2019-01-11 DIAGNOSIS — R001 Bradycardia, unspecified: Secondary | ICD-10-CM | POA: Diagnosis present

## 2019-01-11 DIAGNOSIS — F4323 Adjustment disorder with mixed anxiety and depressed mood: Secondary | ICD-10-CM | POA: Insufficient documentation

## 2019-01-11 DIAGNOSIS — R202 Paresthesia of skin: Secondary | ICD-10-CM | POA: Diagnosis not present

## 2019-01-11 DIAGNOSIS — Z79899 Other long term (current) drug therapy: Secondary | ICD-10-CM | POA: Diagnosis not present

## 2019-01-11 DIAGNOSIS — R4701 Aphasia: Secondary | ICD-10-CM

## 2019-01-11 DIAGNOSIS — G459 Transient cerebral ischemic attack, unspecified: Secondary | ICD-10-CM | POA: Diagnosis not present

## 2019-01-11 DIAGNOSIS — N179 Acute kidney failure, unspecified: Secondary | ICD-10-CM | POA: Diagnosis not present

## 2019-01-11 DIAGNOSIS — R200 Anesthesia of skin: Secondary | ICD-10-CM | POA: Diagnosis not present

## 2019-01-11 DIAGNOSIS — R531 Weakness: Secondary | ICD-10-CM | POA: Diagnosis not present

## 2019-01-11 LAB — URINALYSIS, ROUTINE W REFLEX MICROSCOPIC
Bacteria, UA: NONE SEEN
Bilirubin Urine: NEGATIVE
Glucose, UA: NEGATIVE mg/dL
Ketones, ur: NEGATIVE mg/dL
Leukocytes,Ua: NEGATIVE
Nitrite: NEGATIVE
Protein, ur: NEGATIVE mg/dL
Specific Gravity, Urine: 1.008 (ref 1.005–1.030)
pH: 8 (ref 5.0–8.0)

## 2019-01-11 LAB — CBC WITH DIFFERENTIAL/PLATELET
Abs Immature Granulocytes: 0.01 10*3/uL (ref 0.00–0.07)
Basophils Absolute: 0 10*3/uL (ref 0.0–0.1)
Basophils Relative: 1 %
Eosinophils Absolute: 0.2 10*3/uL (ref 0.0–0.5)
Eosinophils Relative: 2 %
HCT: 36.7 % (ref 36.0–46.0)
Hemoglobin: 13 g/dL (ref 12.0–15.0)
Immature Granulocytes: 0 %
Lymphocytes Relative: 34 %
Lymphs Abs: 2.4 10*3/uL (ref 0.7–4.0)
MCH: 32.7 pg (ref 26.0–34.0)
MCHC: 35.4 g/dL (ref 30.0–36.0)
MCV: 92.4 fL (ref 80.0–100.0)
Monocytes Absolute: 0.5 10*3/uL (ref 0.1–1.0)
Monocytes Relative: 8 %
Neutro Abs: 4 10*3/uL (ref 1.7–7.7)
Neutrophils Relative %: 55 %
Platelets: 279 10*3/uL (ref 150–400)
RBC: 3.97 MIL/uL (ref 3.87–5.11)
RDW: 11.5 % (ref 11.5–15.5)
WBC: 7.2 10*3/uL (ref 4.0–10.5)
nRBC: 0 % (ref 0.0–0.2)

## 2019-01-11 NOTE — Patient Instructions (Signed)
Transient Ischemic Attack A transient ischemic attack (TIA) is a "warning stroke" that causes stroke-like symptoms that then go away quickly. The symptoms of a TIA come on suddenly, and they last less than 24 hours. Unlike a stroke, a TIA does not cause permanent damage to the brain. It is important to know the symptoms of a TIA and what to do. Seek medical care right away, even if your symptoms go away. Having a TIA is a sign that you are at higher risk for a permanent stroke. Lifestyle changes and medical treatments can help prevent a stroke. What are the causes? This condition is caused by a temporary blockage in an artery in the head or neck. The blockage does not allow the brain to get the blood supply it needs and can cause various symptoms. The blockage can be caused by:  Fatty buildup in an artery in the head or neck (atherosclerosis).  A blood clot.  Tearing of an artery (dissection).  Inflammation of an artery (vasculitis). Sometimes the cause is not known. What increases the risk? Certain factors may make you more likely to develop this condition. Some of these factors are things that you can change, such as:  Obesity.  Using products that contain nicotine or tobacco, such as cigarettes and e-cigarettes.  Taking oral birth control, especially if you also use tobacco.  Lack of physical inactivity.  Excessive use of alcohol.  Use of drugs, especially cocaine and methamphetamine. Other risk factors include:  High blood pressure (hypertension).  High cholesterol.  Diabetes mellitus.  Heart disease (coronary artery disease).  Atrial fibrillation.  Being African American or Hispanic.  Being over the age of 102.  Being female.  Family history of stroke.  Previous history of blood clots, stroke, TIA, or heart attack.  Sickle cell disease.  Being a woman with a history of preeclampsia.  Migraine headache.  Sleep apnea.  Chronic inflammatory diseases, such as  rheumatoid arthritis or lupus.  Blood clotting disorders (hypercoagulable state). What are the signs or symptoms? Symptoms of this condition are the same as those of a stroke, but they are temporary. The symptoms develop suddenly, and they go away quickly, usually within minutes to hours. Symptoms may include sudden:  Weakness or numbness in your face, arm, or leg, especially on one side of your body.  Trouble walking or difficulty moving your arms or legs.  Trouble speaking, understanding speech, or both (aphasia).  Vision changes in one or both eyes. These include double vision, blurred vision, or loss of vision.  Dizziness.  Confusion.  Loss of balance or coordination.  Nausea and vomiting.  Severe headache with no known cause. If possible, make note of the exact time that you last felt like your normal self and what time your symptoms started. Tell your health care provider. How is this diagnosed? This condition may be diagnosed based on:  Your symptoms and medical history.  A physical exam.  Imaging tests, usually a CT or MRI scan of the brain.  Blood tests. You may also have other tests, including:  Electrocardiogram (ECG).  Echocardiogram.  Carotid ultrasound.  A scan of the brain circulation (CT angiogram or MRI angiogram).  Continuous heart monitoring. How is this treated? The goal of treatment is to reduce the risk for a subsequent stroke. Treatment may include stroke prevention therapies such as:  Changes to diet or lifestyle to decrease your risk. Lifestyle changes may include exercising and stopping smoking.  Medicines to thin the blood (antiplatelets  or anticoagulants).  Blood pressure medicines.  Medicines to reduce cholesterol.  Treating other health conditions, such as diabetes or atrial fibrillation. If testing shows that you have narrowing in the arteries to your brain, your health care provider may recommend a procedure, such as:  Carotid  endarterectomy. This is a surgery to remove the blockage from your artery.  Carotid angioplasty and stenting. This is a procedure to open or widen an artery in the neck using a metal mesh tube (stent). The stent helps keep the artery open by supporting the artery walls. Follow these instructions at home: Medicines   Take over-the-counter and prescription medicines only as told by your health care provider.  If you were told to take a medicine to thin your blood, such as aspirin or an anticoagulant, take it exactly as told by your health care provider. ? Taking too much blood-thinning medicine can cause bleeding. ? If you do not take enough blood-thinning medicine, you will not have the protection that you need against a stroke and other problems. Eating and drinking   Eat 5 or more servings of fruits and vegetables each day.  Follow instructions from your health care provider about diet. You may need to follow a certain nutrition plan to help manage risk factors for stroke, such as high blood pressure, high cholesterol, diabetes, or obesity. This may include: ? Eating a low-fat, low-salt diet. ? Including a lot of fiber in your diet. ? Limiting the amount of carbohydrates and sugar in your diet.  Limit alcohol intake to no more than 1 drink a day for nonpregnant women and 2 drinks a day for men. One drink equals 12 oz of beer, 5 oz of wine, or 1 oz of hard liquor. General instructions  Maintain a healthy weight.  Stay physically active. Try to get at least 30 minutes of exercise on most or all days.  Find out if you have sleep apnea, and seek treatment if needed.  Do not use any products that contain nicotine or tobacco, such as cigarettes and e-cigarettes. If you need help quitting, ask your health care provider.  Do not abuse drugs.  Keep all follow-up visits as told by your health care provider. This is important. Where to find more information  American Stroke Association:  www.strokeassociation.org  National Stroke Association: www.stroke.org Get help right away if:  You have chest pain or an irregular heartbeat.  You have any symptoms of stroke. The acronym BEFAST is an easy way to remember the main warning signs of stroke. ? B - Balance problems. Signs include dizziness, sudden trouble walking, or loss of balance. ? E - Eye problems. This includes trouble seeing or a sudden change in vision. ? F - Face changes. This includes sudden weakness or numbness of the face, or the face or eyelid drooping to one side. ? A - Arm weakness or numbness. This happens suddenly and usually on one side of the body. ? S - Speech problems. This includes trouble speaking or trouble understanding speech. ? T - Time. Time to call 911 or seek emergency care. Do not wait to see if symptoms will go away. Make note of the time your symptoms started.  Other signs of stroke may include: ? A sudden, severe headache with no known cause. ? Nausea or vomiting. ? Seizure. These symptoms may represent a serious problem that is an emergency. Do not wait to see if the symptoms will go away. Get medical help right away. Call your  local emergency services (911 in the [place] Do not drive yourself to the hospital. Summary  A TIA happens when an artery in the head or neck is blocked, leading to stroke-like symptoms that then go away quickly. The blockage clears before there is any permanent brain damage. A TIA is a medical emergency and requires immediate medical attention.  Symptoms of this condition are the same as those of a stroke, but they are temporary. The symptoms usually develop suddenly, and they go away quickly, usually within minutes to hours.  Having a TIA means that you are at high risk of a stroke in the near future.  Treatment may include medicines to thin the blood as well as medicines, diet changes, and lifestyle changes to manage conditions that increase the risk of another TIA  or a stroke. This information is not intended to replace advice given to you by your health care provider. Make sure you discuss any questions you have with your health care provider. Document Released: 10/17/2004 Document Revised: 07/15/2017 Document Reviewed: 02/20/2016 Elsevier Patient Education  2020 [place] baby aspirin one daily  Call 911 or go directly to ED for any recurrent symptoms.  We will set up MRI and carotid dopplers to further assess.

## 2019-01-11 NOTE — Telephone Encounter (Signed)
Pt's brother, Dr Candis Schatz, who is an ER physician in [place],  called the ED.   Pt has had 3 TIA episodes this week.   Pt saw PCP and plan was for outpatient workup.  Pt's brother is concerned about her escalating frequency and requests she be admitted to the hospital for further workup including neurology consult.  Pt will be coming to the ED for evaluation this evening.

## 2019-01-11 NOTE — Progress Notes (Signed)
Subjective:     Patient ID: Donna Warren, female   DOB: 07-Aug-1950, 68 y.o.   MRN: [id number]  HPI   Donna Warren has history of Mnire's disease, osteoporosis, Gilbert's syndrome, dyslipidemia.  She relates 2 episodes of transient neurologic symptoms including right hand numbness without weakness and difficulty comprehending words while reading.  First episode happened about 2 and half weeks ago.  She had about 10 to 15 minutes where she had difficulty comprehending words she was reading and then she tried to pronounce words and had difficulties getting some of the words out.  She did not describe this as slurred speech is much as difficulty producing words.  Concomitant with this was right hand numbness involving all digits of the hand but no weakness.  She denied any lower extremity symptoms.  Not aware of any facial weakness.  She then had a second episode this past Saturday- again lasting about 10 minutes.  She did not seek medical care either time.  She also relates some recent increased headaches which are bifrontal.  She had some headache intermittently past 4 to 5 days.  Ibuprofen helps.  No visual changes.  No recent confusion other than issues above.  No history of migraine headaches.  No gait difficulties.  No recent head injury.  She does have history of Mnire's disease and is followed closely by ENT for that.  She is on chlorthalidone. She has very high cholesterol by the recent lab work with cholesterol 291 and LDL cholesterol 199.  Is currently not treated with statin.  Past Medical History:  Diagnosis Date  . 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 Arthritis   . Cataract    developing  . Femur fracture, right ([place]) 2003  . Fibroids   . Gilbert's syndrome   . History of liver biopsy nl    records of fatty liver 2008 not confirmed   . Hx of adenomatous colonic polyps   . Meniere disease    Right ear  . Osteoporosis   . Osteoporosis   . Other hemochromatosis    heterozygosity for h63d gene  . Spontaneous abortion    Past Surgical History:  Procedure Laterality Date  . BREAST CYST EXCISION Right   . [place]  . COLONOSCOPY  220, 2004, 2007, 08/13/2010   2002: 8 mm polyp 2004: no polyps 2007: no polyps 2012: hemorrhoids  . hosp r/o mi cv right chest and jaw pain  2010  . LIVER BIOPSY     2015 negative   no fatty liver  records  2008 said fatty liver    . OVARIAN CYST REMOVAL    . POLYPECTOMY    . spontaneously abortion on 3rd preg     triploid dna on path    reports that she has never smoked. She has never used smokeless tobacco. She reports current alcohol use of about 1.0 - 2.0 standard drinks of alcohol per week. She reports that she does not use drugs. family history includes Anxiety disorder in her mother; Colon cancer in her father; Dementia in her mother; Depression in her mother; Healthy in her daughter and daughter; Hearing loss in her mother; Heart attack in her father; Other in her brother, father, and mother; Pulmonary disease in her mother; Stroke in an other family member. Allergies  Allergen Reactions  . Penicillins     rash     Review of Systems  Constitutional: Negative for chills, fatigue and fever.  HENT: Negative for trouble swallowing.   Eyes: Negative for visual disturbance.  Respiratory: Negative for cough, chest tightness, shortness of breath and wheezing.   Cardiovascular: Negative for chest pain, palpitations and leg swelling.  Gastrointestinal: Negative for abdominal pain.  Genitourinary: Negative for dysuria.  Neurological: Positive for speech difficulty, numbness and headaches. Negative for dizziness, seizures, syncope, weakness and light-headedness.       See HPI  Psychiatric/Behavioral: Negative for confusion.       Objective:   Physical Exam Constitutional:      Appearance: She is well-developed.  Eyes:      Pupils: Pupils are equal, round, and reactive to light.  Neck:     Thyroid: No thyromegaly.     Vascular: No JVD.     Comments: No carotid bruits Cardiovascular:     Rate and Rhythm: Normal rate and regular rhythm.     Heart sounds: No murmur. No gallop.   Pulmonary:     Effort: Pulmonary effort is normal. No respiratory distress.     Breath sounds: Normal breath sounds. No wheezing or rales.  Musculoskeletal:     Cervical back: Neck supple.  Neurological:     General: No focal deficit present.     Mental Status: She is alert and oriented to person, place, and time.     Cranial Nerves: No cranial nerve deficit.     Motor: No weakness.     Coordination: Coordination normal.     Gait: Gait normal.     Comments: Cerebellar function normal by finger-to-nose testing.  No focal weakness.        Assessment:     Patient presents with 2 distinct episodes approximately 2 weeks apart of transient right hand numbness and reported 10 to 15 minutes of difficulty with reading comprehension and aphasia.  She is also had more frequent headaches than usual recently and these are bifrontal.  Nonfocal neuro exam at this time.  Episodes above sound typical for likely TIA symptoms    Plan:     -Needs further evaluation and will recommend MRI brain to further assess -Set up carotid Dopplers -Consider starting baby aspirin 81 mg daily until further evaluated -Patient knows to call 911 or go immediately to ED for any recurrent symptoms -consider further evaluation with event monitoring and possible echocardiogram depending on results above.  Eulas Post MD [facility] at [facility]

## 2019-01-11 NOTE — ED Triage Notes (Signed)
Patient reports feeling "Jittery" today with fatigue and brief right hand numbness last Saturday with expressive aphasia . Alert and oriented at arrival , speech clear , no facial asymmetry /no arm drift with equal strong grips.

## 2019-01-12 ENCOUNTER — Encounter (HOSPITAL_COMMUNITY): Payer: Self-pay | Admitting: Radiology

## 2019-01-12 ENCOUNTER — Emergency Department (HOSPITAL_COMMUNITY): Payer: PPO

## 2019-01-12 ENCOUNTER — Observation Stay (HOSPITAL_COMMUNITY): Payer: PPO

## 2019-01-12 ENCOUNTER — Observation Stay (HOSPITAL_BASED_OUTPATIENT_CLINIC_OR_DEPARTMENT_OTHER): Payer: PPO

## 2019-01-12 DIAGNOSIS — G459 Transient cerebral ischemic attack, unspecified: Secondary | ICD-10-CM

## 2019-01-12 DIAGNOSIS — I10 Essential (primary) hypertension: Secondary | ICD-10-CM | POA: Diagnosis not present

## 2019-01-12 DIAGNOSIS — M4802 Spinal stenosis, cervical region: Secondary | ICD-10-CM | POA: Diagnosis not present

## 2019-01-12 DIAGNOSIS — R202 Paresthesia of skin: Secondary | ICD-10-CM | POA: Diagnosis not present

## 2019-01-12 DIAGNOSIS — N179 Acute kidney failure, unspecified: Secondary | ICD-10-CM | POA: Diagnosis present

## 2019-01-12 DIAGNOSIS — I6523 Occlusion and stenosis of bilateral carotid arteries: Secondary | ICD-10-CM | POA: Diagnosis not present

## 2019-01-12 DIAGNOSIS — E876 Hypokalemia: Secondary | ICD-10-CM | POA: Diagnosis not present

## 2019-01-12 DIAGNOSIS — R001 Bradycardia, unspecified: Secondary | ICD-10-CM | POA: Diagnosis not present

## 2019-01-12 DIAGNOSIS — R200 Anesthesia of skin: Secondary | ICD-10-CM | POA: Diagnosis not present

## 2019-01-12 DIAGNOSIS — I361 Nonrheumatic tricuspid (valve) insufficiency: Secondary | ICD-10-CM

## 2019-01-12 DIAGNOSIS — M47812 Spondylosis without myelopathy or radiculopathy, cervical region: Secondary | ICD-10-CM | POA: Diagnosis not present

## 2019-01-12 DIAGNOSIS — R4781 Slurred speech: Secondary | ICD-10-CM | POA: Diagnosis not present

## 2019-01-12 DIAGNOSIS — R471 Dysarthria and anarthria: Secondary | ICD-10-CM | POA: Diagnosis not present

## 2019-01-12 DIAGNOSIS — H8101 Meniere's disease, right ear: Secondary | ICD-10-CM | POA: Diagnosis not present

## 2019-01-12 LAB — COMPREHENSIVE METABOLIC PANEL
ALT: 46 U/L — ABNORMAL HIGH (ref 0–44)
AST: 32 U/L (ref 15–41)
Albumin: 4.3 g/dL (ref 3.5–5.0)
Alkaline Phosphatase: 98 U/L (ref 38–126)
Anion gap: 10 (ref 5–15)
BUN: 19 mg/dL (ref 8–23)
CO2: 31 mmol/L (ref 22–32)
Calcium: 10.6 mg/dL — ABNORMAL HIGH (ref 8.9–10.3)
Chloride: 99 mmol/L (ref 98–111)
Creatinine, Ser: 1.4 mg/dL — ABNORMAL HIGH (ref 0.44–1.00)
GFR calc Af Amer: 45 mL/min — ABNORMAL LOW (ref >60)
GFR calc non Af Amer: 39 mL/min — ABNORMAL LOW (ref >60)
Glucose, Bld: 116 mg/dL — ABNORMAL HIGH (ref 70–99)
Potassium: 3.2 mmol/L — ABNORMAL LOW (ref 3.5–5.1)
Sodium: 140 mmol/L (ref 135–145)
Total Bilirubin: 1.5 mg/dL — ABNORMAL HIGH (ref 0.3–1.2)
Total Protein: 7.3 g/dL (ref 6.5–8.1)

## 2019-01-12 LAB — APTT: aPTT: 34 seconds (ref 24–36)

## 2019-01-12 LAB — CREATININE, URINE, RANDOM: Creatinine, Urine: 50.81 mg/dL

## 2019-01-12 LAB — ECHOCARDIOGRAM COMPLETE
Height: 64 in
Weight: 1952.01 oz

## 2019-01-12 LAB — PROTIME-INR
INR: 1 (ref 0.8–1.2)
Prothrombin Time: 13.5 seconds (ref 11.4–15.2)

## 2019-01-12 LAB — RAPID URINE DRUG SCREEN, HOSP PERFORMED
Amphetamines: NOT DETECTED
Barbiturates: NOT DETECTED
Benzodiazepines: NOT DETECTED
Cocaine: NOT DETECTED
Opiates: NOT DETECTED
Tetrahydrocannabinol: NOT DETECTED

## 2019-01-12 LAB — MAGNESIUM: Magnesium: 2 mg/dL (ref 1.7–2.4)

## 2019-01-12 LAB — SARS CORONAVIRUS 2 (TAT 6-24 HRS): SARS Coronavirus 2: NEGATIVE

## 2019-01-12 LAB — ETHANOL: Alcohol, Ethyl (B): <10 mg/dL (ref <10)

## 2019-01-12 LAB — SODIUM, URINE, RANDOM: Sodium, Ur: 55 mmol/L

## 2019-01-12 LAB — HIV ANTIBODY (ROUTINE TESTING W REFLEX): HIV Screen 4th Generation wRfx: NONREACTIVE

## 2019-01-12 LAB — ANTITHROMBIN III: AntiThromb III Func: 136 % — ABNORMAL HIGH (ref 75–120)

## 2019-01-12 MED ORDER — POTASSIUM CHLORIDE CRYS ER 20 MEQ PO TBCR
40.0000 meq | EXTENDED_RELEASE_TABLET | Freq: Once | ORAL | Status: AC
  Administered 2019-01-12: 40 meq via ORAL
  Filled 2019-01-12: qty 2

## 2019-01-12 MED ORDER — STROKE: EARLY STAGES OF RECOVERY BOOK
Freq: Once | Status: DC
  Filled 2019-01-12: qty 1

## 2019-01-12 MED ORDER — ENOXAPARIN SODIUM 40 MG/0.4ML ~~LOC~~ SOLN
40.0000 mg | Freq: Every day | SUBCUTANEOUS | Status: DC
  Administered 2019-01-12 – 2019-01-13 (×2): 40 mg via SUBCUTANEOUS
  Filled 2019-01-12 (×3): qty 0.4

## 2019-01-12 MED ORDER — ONDANSETRON HCL 4 MG/2ML IJ SOLN: 4.0000 mg | Freq: Four times a day (QID) | INTRAMUSCULAR | Status: DC | PRN

## 2019-01-12 MED ORDER — SODIUM CHLORIDE 0.9 % IV SOLN: INTRAVENOUS | Status: DC

## 2019-01-12 MED ORDER — POTASSIUM CHLORIDE 10 MEQ/100ML IV SOLN
10.0000 meq | Freq: Once | INTRAVENOUS | Status: AC
  Administered 2019-01-12: 10 meq via INTRAVENOUS
  Filled 2019-01-12: qty 100

## 2019-01-12 MED ORDER — ACETAMINOPHEN 160 MG/5ML PO SOLN: 650.0000 mg | ORAL | Status: DC | PRN

## 2019-01-12 MED ORDER — ACETAMINOPHEN 650 MG RE SUPP: 650.0000 mg | RECTAL | Status: DC | PRN

## 2019-01-12 MED ORDER — ASPIRIN EC 81 MG PO TBEC
81.0000 mg | DELAYED_RELEASE_TABLET | Freq: Every day | ORAL | Status: DC
  Administered 2019-01-12 – 2019-01-13 (×2): 81 mg via ORAL
  Filled 2019-01-12 (×2): qty 1

## 2019-01-12 MED ORDER — MAGNESIUM SULFATE 2 GM/50ML IV SOLN
2.0000 g | Freq: Once | INTRAVENOUS | Status: AC
  Administered 2019-01-12: 2 g via INTRAVENOUS
  Filled 2019-01-12: qty 50

## 2019-01-12 MED ORDER — CHLORTHALIDONE 25 MG PO TABS
25.0000 mg | ORAL_TABLET | Freq: Every day | ORAL | Status: DC
  Administered 2019-01-13: 25 mg via ORAL
  Filled 2019-01-12: qty 1

## 2019-01-12 MED ORDER — ACETAMINOPHEN 325 MG PO TABS: 650.0000 mg | ORAL_TABLET | ORAL | Status: DC | PRN

## 2019-01-12 MED ORDER — DIAZEPAM 2 MG PO TABS: 2.0000 mg | ORAL_TABLET | Freq: Every day | ORAL | Status: DC | PRN

## 2019-01-12 MED ORDER — IOHEXOL 350 MG/ML SOLN
50.0000 mL | Freq: Once | INTRAVENOUS | Status: AC | PRN
  Administered 2019-01-12: 50 mL via INTRAVENOUS

## 2019-01-12 MED ORDER — SENNOSIDES-DOCUSATE SODIUM 8.6-50 MG PO TABS: 1.0000 | ORAL_TABLET | Freq: Every evening | ORAL | Status: DC | PRN

## 2019-01-12 MED ORDER — LORAZEPAM 0.5 MG PO TABS: 0.5000 mg | ORAL_TABLET | Freq: Three times a day (TID) | ORAL | Status: DC | PRN

## 2019-01-12 MED ORDER — CITALOPRAM HYDROBROMIDE 20 MG PO TABS
10.0000 mg | ORAL_TABLET | Freq: Every day | ORAL | Status: DC
  Administered 2019-01-12 – 2019-01-13 (×2): 10 mg via ORAL
  Filled 2019-01-12 (×2): qty 1

## 2019-01-12 NOTE — ED Notes (Signed)
Pt going to MRI

## 2019-01-12 NOTE — ED Notes (Signed)
Report called to floor, yet patient states she has not had the admission discussion with the MD.  Last she heard from a provider is that she might go home on ASA.  Msg sent to Dr. Tamala Julian.

## 2019-01-12 NOTE — Progress Notes (Signed)
Pt up to Bathroom without assistance. Steady balance. No needs noted.

## 2019-01-12 NOTE — ED Provider Notes (Signed)
Emergency Department Provider Note   I have reviewed the triage vital signs and the nursing notes.   HISTORY  Chief Complaint Feels Jittery / Fatigue   HPI Donna Warren is a 68 y.o. female with medical problems documented below who presents to the emergency department today secondary to neurologic changes.  Patient states she has a history of Mnire's but this is totally different.  Patient states that she has had 4 episodes in the last 3 months of having what sounds like dysarthria.  She will be trying to read something and she cannot speak it out loud.  She understands words in her head but cannot make her mouth say the words.  The first 2 were a couple months apart but the last 2 over the last week and both were associated with right hand paresthesias and weakness.  Patient contacted her physician who got her scheduled for outpatient work-up for TIA however her brother is a physician and feels that she needs to be admitted to the hospital to get her TIA work-up and thus instructed her to come here.  Patient still feels "jittery" and not quite like her self.  She also feels slight fatigue.  At this time she feels like her neurologic symptoms are significantly improved however.   No other associated or modifying symptoms.    Past Medical History:  Diagnosis Date  . 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 Arthritis   . Cataract    developing  . Femur fracture, right ([place]) 2003  . Fibroids   . Gilbert's syndrome   . History of liver biopsy nl    records of fatty liver 2008 not confirmed   . Hx of adenomatous colonic polyps   . Meniere disease    Right ear  . Osteoporosis   . Osteoporosis   . Other hemochromatosis    heterozygosity for h63d gene  . Spontaneous abortion     Patient Active Problem List   Diagnosis Date Noted  . Radial head fracture 10/23/2017  . Osteoarthritis of lumbar  spine 06/11/2016  . Primary osteoarthritis of both hands 06/05/2016  . DJD (degenerative joint disease), cervical 05/08/2016  . Female genital lesion 08/12/2014  . Osteoporosis 07/20/2014  . Elevated LFTs 05/21/2013  . 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 Meniere's disease 10/14/2011  . Skin lesion 08/14/2011  . Right shoulder strain 08/13/2011  . Phlebitis, superficial 08/16/2010  . DECREASED HEARING, RIGHT EAR 11/08/2009  . PROBLEMS WITH HEARING 11/08/2009  . DYSLIPIDEMIA 10/07/2008  . Gilbert syndrome 10/26/2007  . FIBROIDS, UTERUS 05/29/2007  . LIVER HEMANGIOMA 05/29/2007  . COLONIC POLYPS, HX OF 05/29/2007    Past Surgical History:  Procedure Laterality Date  . BREAST CYST EXCISION Right   . [place]  . COLONOSCOPY  220, 2004, 2007, 08/13/2010   2002: 8 mm polyp 2004: no polyps 2007: no polyps 2012: hemorrhoids  . hosp r/o mi cv right chest and jaw pain  2010  . LIVER BIOPSY     2015 negative   no fatty liver  records  2008 said fatty liver    . OVARIAN CYST REMOVAL    . POLYPECTOMY    . spontaneously abortion on 3rd preg     triploid dna on path    Current Outpatient Rx  . Order #: [id number] Class: Historical Med  .  Order #: [id number] Class: Normal  . Order #: [id number] Class: Historical Med  . Order #: [id number] Class: Normal  . Order #: [id number] Class: Historical Med  . Order #: [id number] Class: Historical Med  . Order #: [id number] Class: Print  . Order #: [id number] Class: Normal  . Order #: [id number] Class: Historical Med  . Order #: [id number] Class: Historical Med  . Order #: [id number] Class: Normal    Allergies Penicillins  Family History  Problem Relation Age of Onset  . Colon cancer Father        dx'd late to mid 69's  . Other Father        tinnitus  . Heart attack Father   . Depression Mother   . Anxiety disorder Mother   . Dementia Mother   .  Other Mother        high calcium  . Hearing loss Mother        death  . Pulmonary disease Mother   . Stroke Other        silent  . Other Brother        elevated LFTs  . Healthy Daughter   . Healthy Daughter   . Colon polyps Neg Hx   . Esophageal cancer Neg Hx   . Rectal cancer Neg Hx   . Stomach cancer Neg Hx   . Breast cancer Neg Hx     Social History Social History   Tobacco Use  . Smoking status: Never Smoker  . Smokeless tobacco: Never Used  Substance Use Topics  . Alcohol use: Yes    Alcohol/week: 1.0 - 2.0 standard drinks    Types: 1 - 2 Glasses of wine per week    Comment: glass of wine a week  . Drug use: No    Review of Systems  All other systems negative except as documented in the HPI. All pertinent positives and negatives as reviewed in the HPI. ____________________________________________   PHYSICAL EXAM:  VITAL SIGNS: ED Triage Vitals  Enc Vitals Group     BP 01/12/19 0033 (!) 108/54     Pulse Rate 01/12/19 0033 67     Resp 01/12/19 0033 16     Temp 01/12/19 0033 98 F (36.7 C)     Temp Source 01/12/19 0033 Oral     SpO2 01/12/19 0033 99 %     Weight 01/12/19 0345 122 lb (55.3 kg)     Height 01/12/19 0345 5\' 4"  (1.626 m)    Constitutional: Alert and oriented. Well appearing and in no acute distress. Eyes: Conjunctivae are normal. PERRL. EOMI. Head: Atraumatic. Nose: No congestion/rhinnorhea. Mouth/Throat: Mucous membranes are moist.  Oropharynx non-erythematous. Neck: No stridor.  No meningeal signs.   Cardiovascular: Normal rate, regular rhythm. Good peripheral circulation. Grossly normal heart sounds.   Respiratory: Normal respiratory effort.  No retractions. Lungs CTAB. Gastrointestinal: Soft and nontender. No distention.  Musculoskeletal: No lower extremity tenderness nor edema. No gross deformities of extremities. Neurologic:  No altered mental status, able to give full seemingly accurate history.  Face is symmetric, EOM's intact,  pupils equal and reactive, vision intact, tongue and uvula midline without deviation. Upper and Lower extremity motor 5/5, intact pain perception in distal extremities, 2+ reflexes in biceps, patella and achilles tendons. Able to perform finger to nose normal with both hands. Walks without assistance or evident ataxia.  Skin:  Skin is warm, dry and intact. No rash noted.  ____________________________________________   LABS (all labs ordered are listed, but only abnormal results are displayed)  Labs Reviewed  COMPREHENSIVE METABOLIC PANEL - Abnormal; Notable for the following components:      Result Value   Potassium 3.2 (*)    Glucose, Bld 116 (*)    Creatinine, Ser 1.40 (*)    Calcium 10.6 (*)    ALT 46 (*)    Total Bilirubin 1.5 (*)    GFR calc non Af Amer 39 (*)    GFR calc Af Amer 45 (*)    All other components within normal limits  URINALYSIS, ROUTINE W REFLEX MICROSCOPIC - Abnormal; Notable for the following components:   Hgb urine dipstick SMALL (*)    All other components within normal limits  CBC WITH DIFFERENTIAL/PLATELET  ETHANOL  MAGNESIUM  PROTIME-INR  APTT  RAPID URINE DRUG SCREEN, HOSP PERFORMED   ____________________________________________  EKG   EKG Interpretation  Date/Time:  Tuesday January 12 2019 03:45:46 EST Ventricular Rate:  59 PR Interval:    QRS Duration: 89 QT Interval:  409 QTC Calculation: 406 R Axis:   69 Text Interpretation: Sinus rhythm Consider right atrial enlargement No significant change since last tracing Confirmed by Merrily Pew [phone]) on 01/12/2019 5:23:15 AM       ____________________________________________  RADIOLOGY  CT Head Wo Contrast  Result Date: 01/12/2019 CLINICAL DATA:  Fatigue, jittery and brief right hand numbness 3 days ago. EXAM: CT HEAD WITHOUT CONTRAST TECHNIQUE: Contiguous axial images were obtained from the base of the skull through the vertex without intravenous contrast. COMPARISON:  None.  FINDINGS: Brain: The ventricles are normal in size and configuration. No extra-axial fluid collections are identified. The gray-white differentiation is maintained. No CT findings for acute hemispheric infarction or intracranial hemorrhage. No mass lesions. The brainstem and cerebellum are normal. Vascular: Scattered vascular calcifications. No hyperdense vessels or obvious aneurysm. Skull: No acute skull fracture.  No bone lesion. Sinuses/Orbits: The paranasal sinuses and mastoid air cells are clear. The globes are intact. Other: No scalp lesions, laceration or hematoma. IMPRESSION: Normal head CT. Electronically Signed   By: Marijo Sanes M.D.   On: 01/12/2019 05:54    ____________________________________________  INITIAL IMPRESSION / ASSESSMENT AND PLAN / ED COURSE  Likely TIA. Will need workup. Discussed with Dr. Cheral Marker who will see. Discussed with Dr. Alcario Drought who will see.   Pertinent labs & imaging results that were available during my care of the patient were reviewed by me and considered in my medical decision making (see chart for details). ____________________________________________  FINAL CLINICAL IMPRESSION(S) / ED DIAGNOSES  Final diagnoses:  TIA (transient ischemic attack)     MEDICATIONS GIVEN DURING THIS VISIT:  Medications  potassium chloride SA (KLOR-CON) CR tablet 40 mEq (40 mEq Oral Given 01/12/19 0344)  potassium chloride 10 mEq in 100 mL IVPB ( Intravenous Stopped 01/12/19 0552)  magnesium sulfate IVPB 2 g 50 mL ( Intravenous Stopped 01/12/19 0447)     NEW OUTPATIENT MEDICATIONS STARTED DURING THIS VISIT:  New Prescriptions   No medications on file    Note:  This note was prepared with assistance of Dragon voice recognition software. Occasional wrong-word or sound-a-like substitutions may have occurred due to the inherent limitations of voice recognition software.   Iyonnah Ferrante, [name], MD 01/12/19 [phone]

## 2019-01-12 NOTE — ED Notes (Signed)
Pt up to BR without need for (A) or distress noted.  Talkative about diets and food choices.  No needs noted.

## 2019-01-12 NOTE — ED Notes (Signed)
Pt diet order placed, passed swallow screen earlier. Pt is calling to order tray and will let me know if she needs any assistance.

## 2019-01-12 NOTE — Progress Notes (Signed)
  Echocardiogram 2D Echocardiogram has been performed.  Darlina Sicilian M 01/12/2019, 10:41 AM

## 2019-01-12 NOTE — H&P (Signed)
History and Physical    Donna Warren [id number] DOB: 29-May-1950 DOA: 01/11/2019  Referring MD/NP/PA: Minda Ditto, MD PCP: Burnis Medin, MD  Patient coming from: Home  Chief Complaint: Right hand numbness  I have personally briefly reviewed patient's old medical records in [place]   HPI: Donna Warren is a 68 y.o. female with medical history significant of Gilbert syndrome, Mnire's disease, osteoporosis, and arthritis.  Symptoms may have first occurred in the middle of November, but thereafter has had 2 other episodes last occurring 3 days ago.  She reports having right hand numbness that lasted approximately 15 minutes and then self resolves.  She had tried shaking her hand without relief.  Associated symptoms include having lightheadedness/dizziness, mild frontal headaches, difficulty understanding what she was reading, and inability to pronounce certain words.  She reports occasionally getting headaches usually go away after her morning coffee and were different than frontal headaches.  Last night however she felt jittery and thought her hand was going numb again and came into the emergency department for further evaluation.  Her brother who is an ER doctor recommended patient to get a hypercoagulable work-up as well as Lyme titers.  She has a known history of Mnire's disease and had several episodes in October which was causing her to have hearing loss.  She ultimately had to be placed on prednisone and her chlorthalidone was increased from 25 mg to 50 mg.  She reports an improvement of her symptoms this month and she has regained some of her hearing.  ED Course: Upon admission into the emergency department patient was noted to have heart rates 43-71, and all other vital signs within normal limits.  Labs significant for potassium 3.2, BUN 19, creatinine 1.4, and calcium 10.6.  Patient was given potassium chloride 40 mEq p.o., potassium chloride 10 mEq IV, and 2 g of  magnesium sulfate.  Review of Systems  Constitutional: Negative for chills and fever.  HENT: Positive for hearing loss. Negative for nosebleeds and sinus pain.   Eyes: Negative for double vision and photophobia.  Respiratory: Negative for cough and shortness of breath.   Cardiovascular: Negative for chest pain and leg swelling.  Gastrointestinal: Negative for abdominal pain, nausea and vomiting.  Genitourinary: Negative for dysuria and hematuria.  Musculoskeletal: Negative for falls and myalgias.  Skin: Negative for itching.  Neurological: Positive for dizziness, speech change and headaches. Negative for loss of consciousness.  Endo/Heme/Allergies: Negative for polydipsia.  Psychiatric/Behavioral: Negative for substance abuse. The patient is nervous/anxious.     Past Medical History:  Diagnosis Date  . 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 Arthritis   . Cataract    developing  . Femur fracture, right ([place]) 2003  . Fibroids   . Gilbert's syndrome   . History of liver biopsy nl    records of fatty liver 2008 not confirmed   . Hx of adenomatous colonic polyps   . Meniere disease    Right ear  . Osteoporosis   . Osteoporosis   . Other hemochromatosis    heterozygosity for h63d gene  . Spontaneous abortion     Past Surgical History:  Procedure Laterality Date  . BREAST CYST EXCISION Right   . [place]  . COLONOSCOPY  220, 2004, 2007, 08/13/2010   2002: 8 mm polyp 2004: no polyps 2007: no polyps 2012: hemorrhoids  . hosp r/o mi cv right chest and  jaw pain  2010  . LIVER BIOPSY     2015 negative   no fatty liver  records  2008 said fatty liver    . OVARIAN CYST REMOVAL    . POLYPECTOMY    . spontaneously abortion on 3rd preg     triploid dna on path     reports that she has never smoked. She has never used smokeless tobacco. She reports current alcohol use of about 1.0 -  2.0 standard drinks of alcohol per week. She reports that she does not use drugs.  Allergies  Allergen Reactions  . Penicillins     rash    Family History  Problem Relation Age of Onset  . Colon cancer Father        dx'd late to mid 11's  . Other Father        tinnitus  . Heart attack Father   . Depression Mother   . Anxiety disorder Mother   . Dementia Mother   . Other Mother        high calcium  . Hearing loss Mother        death  . Pulmonary disease Mother   . Stroke Other        silent  . Other Brother        elevated LFTs  . Healthy Daughter   . Healthy Daughter   . Colon polyps Neg Hx   . Esophageal cancer Neg Hx   . Rectal cancer Neg Hx   . Stomach cancer Neg Hx   . Breast cancer Neg Hx     Prior to Admission medications   Medication Sig Start Date End Date Taking? Authorizing Provider  Calcium Carb-Cholecalciferol (CALCIUM 600/VITAMIN D3) 600-800 MG-UNIT TABS Take 1 tablet by mouth daily.    [provider]  chlorthalidone (HYGROTON) 25 MG tablet TAKE 1 TABLET BY MOUTH EVERY DAY 01/08/19   Panosh, Standley Brooking, MD  cholecalciferol (VITAMIN D3) 25 MCG (1000 UT) tablet Take 1,000 Units by mouth daily.    [provider]  citalopram (CELEXA) 10 MG tablet Take 1 tablet (10 mg total) by mouth daily. 11/10/18   Panosh, Standley Brooking, MD  diazepam (VALIUM) 5 MG tablet  12/26/17   [provider]  Diclofenac Sodium (PENNSAID TD) Place onto the skin as needed.    [provider]  diclofenac sodium (VOLTAREN) 1 % GEL Apply 3 g to 3 large joints up to 3 times a day. 05/06/17   Ofilia Neas, PA-C  LORazepam (ATIVAN) 0.5 MG tablet 1-2 po if needed for anxiety up to every 8 hours 04/17/18   Panosh, Standley Brooking, MD  ondansetron (ZOFRAN-ODT) 4 MG disintegrating tablet Take 4 mg by mouth every 8 (eight) hours as needed.  12/25/17   [provider]  TURMERIC PO Take by mouth daily.    [provider]  Vitamin D, Ergocalciferol, (DRISDOL) 1.25  MG (50000 UT) CAPS capsule TAKE 1 CAPSULE (50,000 UNITS TOTAL) BY MOUTH EVERY 7 (SEVEN) DAYS. 01/08/18   Lyndal Pulley, DO    Physical Exam:  Constitutional: NAD, calm, comfortable Vitals:   01/12/19 0345 01/12/19 0400 01/12/19 0415 01/12/19 0748  BP:  102/61 90/69 (!) 118/92  Pulse:  66 71 76  Resp:  15 15 16   Temp:      TempSrc:      SpO2:  100% 99% 99%  Weight: 55.3 kg     Height: 5\' 4"  (1.626 m)  Eyes: PERRL, lids and conjunctivae normal ENMT: Mucous membranes are moist. Posterior pharynx clear of any exudate or lesions.Normal dentition.  Neck: normal, supple, no masses, no thyromegaly Respiratory: clear to auscultation bilaterally, no wheezing, no crackles. Normal respiratory effort. No accessory muscle use.  Cardiovascular: Regular rate and rhythm, no murmurs / rubs / gallops. No extremity edema. 2+ pedal pulses. No carotid bruits.  Abdomen: no tenderness, no masses palpated. No hepatosplenomegaly. Bowel sounds positive.  Musculoskeletal: no clubbing / cyanosis. No joint deformity upper and lower extremities. Good ROM, no contractures. Normal muscle tone.  Skin: no rashes, lesions, ulcers. No induration Neurologic: CN 2-12 grossly intact. Sensation intact, DTR normal. Strength 5/5 in all 4.  Psychiatric: Normal judgment and insight. Alert and oriented x 3. Normal mood.     Labs on Admission: I have personally reviewed following labs and imaging studies  CBC: Recent Labs  Lab 01/11/19 2309  WBC 7.2  NEUTROABS 4.0  HGB 13.0  HCT 36.7  MCV 92.4  PLT 123XX123   Basic Metabolic Panel: Recent Labs  Lab 01/11/19 2309 01/12/19 0342  NA 140  --   K 3.2*  --   CL 99  --   CO2 31  --   GLUCOSE 116*  --   BUN 19  --   CREATININE 1.40*  --   CALCIUM 10.6*  --   MG  --  2.0   GFR: Estimated Creatinine Clearance: 33.2 mL/min (A) (by C-G formula based on SCr of 1.4 mg/dL (H)). Liver Function Tests: Recent Labs  Lab 01/11/19 2309  AST 32  ALT 46*  ALKPHOS 98    BILITOT 1.5*  PROT 7.3  ALBUMIN 4.3   No results for input(s): LIPASE, AMYLASE in the last 168 hours. No results for input(s): AMMONIA in the last 168 hours. Coagulation Profile: Recent Labs  Lab 01/12/19 0420  INR 1.0   Cardiac Enzymes: No results for input(s): CKTOTAL, CKMB, CKMBINDEX, TROPONINI in the last 168 hours. BNP (last 3 results) No results for input(s): PROBNP in the last 8760 hours. HbA1C: No results for input(s): HGBA1C in the last 72 hours. CBG: No results for input(s): GLUCAP in the last 168 hours. Lipid Profile: No results for input(s): CHOL, HDL, LDLCALC, TRIG, CHOLHDL, LDLDIRECT in the last 72 hours. Thyroid Function Tests: No results for input(s): TSH, T4TOTAL, FREET4, T3FREE, THYROIDAB in the last 72 hours. Anemia Panel: No results for input(s): VITAMINB12, FOLATE, FERRITIN, TIBC, IRON, RETICCTPCT in the last 72 hours. Urine analysis:    Component Value Date/Time   COLORURINE YELLOW 01/11/2019 2202   APPEARANCEUR CLEAR 01/11/2019 2202   LABSPEC 1.008 01/11/2019 2202   PHURINE 8.0 01/11/2019 2202   GLUCOSEU NEGATIVE 01/11/2019 2202   GLUCOSEU NEGATIVE 03/12/2011 1536   HGBUR SMALL (A) 01/11/2019 2202   HGBUR trace-lysed 07/05/2009 [place] 01/11/2019 2202   BILIRUBINUR n 02/28/2011 1652   KETONESUR NEGATIVE 01/11/2019 2202   PROTEINUR NEGATIVE 01/11/2019 2202   UROBILINOGEN 0.2 03/12/2011 1536   NITRITE NEGATIVE 01/11/2019 2202   LEUKOCYTESUR NEGATIVE 01/11/2019 2202   Sepsis Labs: No results found for this or any previous visit (from the past 240 hour(s)).   Radiological Exams on Admission: CT Head Wo Contrast  Result Date: 01/12/2019 CLINICAL DATA:  Fatigue, jittery and brief right hand numbness 3 days ago. EXAM: CT HEAD WITHOUT CONTRAST TECHNIQUE: Contiguous axial images were obtained from the base of the skull through the vertex without intravenous contrast. COMPARISON:  None. FINDINGS: Brain: The  ventricles are normal  in size and configuration. No extra-axial fluid collections are identified. The gray-white differentiation is maintained. No CT findings for acute hemispheric infarction or intracranial hemorrhage. No mass lesions. The brainstem and cerebellum are normal. Vascular: Scattered vascular calcifications. No hyperdense vessels or obvious aneurysm. Skull: No acute skull fracture.  No bone lesion. Sinuses/Orbits: The paranasal sinuses and mastoid air cells are clear. The globes are intact. Other: No scalp lesions, laceration or hematoma. IMPRESSION: Normal head CT. Electronically Signed   By: Marijo Sanes M.D.   On: 01/12/2019 05:54    EKG: Independently reviewed.  Sinus bradycardia 59 bpm  Assessment/Plan Right hand weakness and paresthesias: Patient reports 3 episodes of right hand numbness and weakness over the last week.  CT scan of the brain was within normal limits.  Patient brother is ER doctor and requested patient have hypercoagulable work-up and Lyme titers.  Question of symptoms secondary to TIA versus electrolyte abnormalities versus other. -Admit to medical telemetry bed -TIA/stroke order set initiated -Neuro checks -Check  MRI brain -Check CT a of head and neck -Follow-up hypercoagulable work-up and Lyme titers -PT/OT/Speech to eval and treat -Check echocardiogram -Check Hemoglobin A1c and lipid panel in a.m. -ASA -Appreciate neurology consultative services, will follow-up for further recommendation  Acute kidney injury: Patient presents with creatinine elevated up to 1.4 with BUN 19.  Baseline creatinine previously noted to be around 0.9-1.  Urinalysis was negative for any acute abnormalities -Check urine creatinine, sodium, and urea -Gentle IV fluids at 75 mL/h -Hold nephrotoxic agent -Recheck kidney function in a.m.  Essential hypertension: Blood pressure currently within normal limits. -Hold chlorthalidone due to acute kidney injury and need for permissive hypertension -Allow  for permissive hypertension  Mnire's disease: Patient reports having a cluster of attacks back in October for which she had to be placed on prednisone and ultimately her dose of chlorthalidone was increased from 25mg  to 50 mg. -Continue Valium and/or Ativan as needed -Would recommend discussing with primary about decreasing chlorthalidone back to 25 mg daily  Bradycardia: Patient noted to have heart rates anywhere from 43-71. -Follow-up telemetry overnight  Hypokalemia: On admission initial potassium 3.2.  Patient was given a total of 50 mEq of potassium chloride in the ED. suspect symptoms likely related with increased dose of chlorthalidone. -Continue to monitor and replace as needed  Hypercalcemia: Acute.  Initial calcium mildly elevated at 10.6.  Patient on calcium supplementation. -Hold calcium supplementation -Recheck calcium and ionized calcium levels in a.m.  Adjustment disorder -Continue celexa  DVT prophylaxis: Lovenox Code Status: Full Family Communication: No family present at bedside Disposition Plan: Likely discharge home tomorrow if work-up negative Consults called: Neurology Admission status: Observation  Norval Morton MD [facility] Pager [phone]   If 7PM-7AM, please contact night-coverage [email or web address] Password [facility]  01/12/2019, 7:54 AM

## 2019-01-12 NOTE — Consult Note (Signed)
Referring Physician: Dr. Dayna Barker    Chief Complaint: Transient episodes of right hand numbness and aphasia  HPI: Donna Warren is an 68 y.o. female who presents to the ED for evaluation of transient episodes of right hand numbness and weakness accompanied by difficulty with reading comprehension and expressive aphasia. Overall, she has had 3 of the episodes in the past week, each lasting for about 15 minutes.   She also reported feeling "jittery" on Monday, prior to presenting, in conjunction with fatigue.  and brief right hand numbness last Saturday with expressive aphasia. She was alert and oriented at arrival, with clear speech, no facial asymmetry, no arm drift and equal strong grips.   She was recently treated for an exacerbation of her Meniere's disease involving her right ear.   LSN: Monday morning.  tPA Given: No: Symptoms resolved.   Past Medical History:  Diagnosis Date  . 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 Arthritis   . Cataract    developing  . Femur fracture, right ([place]) 2003  . Fibroids   . Gilbert's syndrome   . History of liver biopsy nl    records of fatty liver 2008 not confirmed   . Hx of adenomatous colonic polyps   . Meniere disease    Right ear  . Osteoporosis   . Osteoporosis   . Other hemochromatosis    heterozygosity for h63d gene  . Spontaneous abortion     Past Surgical History:  Procedure Laterality Date  . BREAST CYST EXCISION Right   . [place]  . COLONOSCOPY  220, 2004, 2007, 08/13/2010   2002: 8 mm polyp 2004: no polyps 2007: no polyps 2012: hemorrhoids  . hosp r/o mi cv right chest and jaw pain  2010  . LIVER BIOPSY     2015 negative   no fatty liver  records  2008 said fatty liver    . OVARIAN CYST REMOVAL    . POLYPECTOMY    . spontaneously abortion on 3rd preg     triploid dna on path    Family History  Problem Relation Age of Onset   . Colon cancer Father        dx'd late to mid 21's  . Other Father        tinnitus  . Heart attack Father   . Depression Mother   . Anxiety disorder Mother   . Dementia Mother   . Other Mother        high calcium  . Hearing loss Mother        death  . Pulmonary disease Mother   . Stroke Other        silent  . Other Brother        elevated LFTs  . Healthy Daughter   . Healthy Daughter   . Colon polyps Neg Hx   . Esophageal cancer Neg Hx   . Rectal cancer Neg Hx   . Stomach cancer Neg Hx   . Breast cancer Neg Hx    Social History:  reports that she has never smoked. She has never used smokeless tobacco. She reports current alcohol use of about 1.0 - 2.0 standard drinks of alcohol per week. She reports that she does not use drugs.  Allergies:  Allergies  Allergen Reactions  . Penicillins     rash    Home Medications: Current Facility-Administered Medications on File Prior to Encounter  Medication Dose Route Frequency Provider Last Rate Last Admin  . 0.9 %  sodium chloride infusion  500 mL Intravenous Once Gatha Mayer, MD       Current Outpatient Medications on File Prior to Encounter  Medication Sig Dispense Refill  . Calcium Carb-Cholecalciferol (CALCIUM 600/VITAMIN D3) 600-800 MG-UNIT TABS Take 1 tablet by mouth daily.    . chlorthalidone (HYGROTON) 25 MG tablet TAKE 1 TABLET BY MOUTH EVERY DAY 90 tablet 0  . cholecalciferol (VITAMIN D3) 25 MCG (1000 UT) tablet Take 1,000 Units by mouth daily.    . citalopram (CELEXA) 10 MG tablet Take 1 tablet (10 mg total) by mouth daily. 90 tablet 1  . diazepam (VALIUM) 5 MG tablet     . Diclofenac Sodium (PENNSAID TD) Place onto the skin as needed.    . diclofenac sodium (VOLTAREN) 1 % GEL Apply 3 g to 3 large joints up to 3 times a day. 3 Tube 3  . LORazepam (ATIVAN) 0.5 MG tablet 1-2 po if needed for anxiety up to every 8 hours 24 tablet 0  . ondansetron (ZOFRAN-ODT) 4 MG disintegrating tablet Take 4 mg by mouth every 8  (eight) hours as needed.   1  . TURMERIC PO Take by mouth daily.    . Vitamin D, Ergocalciferol, (DRISDOL) 1.25 MG (50000 UT) CAPS capsule TAKE 1 CAPSULE (50,000 UNITS TOTAL) BY MOUTH EVERY 7 (SEVEN) DAYS. 12 capsule 0     ROS: Has a mild frontal headache. Other symptoms as per HPI. Comprehensive ROS otherwise negative.   Physical Examination: Blood pressure 90/69, pulse 71, temperature 98 F (36.7 C), temperature source Oral, resp. rate 15, height 5\' 4"  (1.626 m), weight 55.3 kg, SpO2 99 %.  HEENT: [place]/AT Lungs: Respirations unlabored Ext: No edema  Neurologic Examination: Mental Status: Alert, oriented, thought content appropriate.  Speech fluent without evidence of aphasia.  Able to follow all commands without difficulty. Cranial Nerves: II:  Visual fields intact. PERRL. III,IV, VI: No ptosis. EOMI. No nystagmus.   V,VII: Smile symmetric. Facial temp sensation equal bilaterally VIII: hearing intact to conversation IX,X: Palate rises symmetrically XI: Symmetric shoulder shrug XII: midline tongue extension  Motor: Right : Upper extremity   5/5    Left:     Upper extremity   5/5  Lower extremity   5/5     Lower extremity   5/5 Normal tone throughout; no atrophy noted Sensory: Temp and FT intact x 4, with special attention to hands and wrists.  Deep Tendon Reflexes:  3+ bilateral brachioradialis, biceps and patellae. 2+ achilles bilaterally. Toes downgoing.  Cerebellar: No ataxia with FNF bilaterally  Gait: Deferred  Results for orders placed or performed during the hospital encounter of 01/11/19 (from the past 48 hour(s))  Urinalysis, Routine w reflex microscopic     Status: Abnormal   Collection Time: 01/11/19 10:02 PM  Result Value Ref Range   Color, Urine YELLOW YELLOW   APPearance CLEAR CLEAR   Specific Gravity, Urine 1.008 1.005 - 1.030   pH 8.0 5.0 - 8.0   Glucose, UA NEGATIVE NEGATIVE mg/dL   Hgb urine dipstick SMALL (A) NEGATIVE   Bilirubin Urine NEGATIVE  NEGATIVE   Ketones, ur NEGATIVE NEGATIVE mg/dL   Protein, ur NEGATIVE NEGATIVE mg/dL   Nitrite NEGATIVE NEGATIVE   Leukocytes,Ua NEGATIVE NEGATIVE   RBC / HPF 11-20 0 - 5 RBC/hpf   WBC, UA 0-5 0 - 5 WBC/hpf   Bacteria, UA NONE SEEN NONE SEEN  Comment: Performed at [facility], [place] [address], [place], [place] [address]  CBC with Differential     Status: None   Collection Time: 01/11/19 11:09 PM  Result Value Ref Range   WBC 7.2 4.0 - 10.5 K/uL   RBC 3.97 3.87 - 5.11 MIL/uL   Hemoglobin 13.0 12.0 - 15.0 g/dL   HCT 36.7 36.0 - 46.0 %   MCV 92.4 80.0 - 100.0 fL   MCH 32.7 26.0 - 34.0 pg   MCHC 35.4 30.0 - 36.0 g/dL   RDW 11.5 11.5 - 15.5 %   Platelets 279 150 - 400 K/uL   nRBC 0.0 0.0 - 0.2 %   Neutrophils Relative % 55 %   Neutro Abs 4.0 1.7 - 7.7 K/uL   Lymphocytes Relative 34 %   Lymphs Abs 2.4 0.7 - 4.0 K/uL   Monocytes Relative 8 %   Monocytes Absolute 0.5 0.1 - 1.0 K/uL   Eosinophils Relative 2 %   Eosinophils Absolute 0.2 0.0 - 0.5 K/uL   Basophils Relative 1 %   Basophils Absolute 0.0 0.0 - 0.1 K/uL   Immature Granulocytes 0 %   Abs Immature Granulocytes 0.01 0.00 - 0.07 K/uL    Comment: Performed at [facility], [address], [place], [place] [address]  Comprehensive metabolic panel     Status: Abnormal   Collection Time: 01/11/19 11:09 PM  Result Value Ref Range   Sodium 140 135 - 145 mmol/L   Potassium 3.2 (L) 3.5 - 5.1 mmol/L   Chloride 99 98 - 111 mmol/L   CO2 31 22 - 32 mmol/L   Glucose, Bld 116 (H) 70 - 99 mg/dL   BUN 19 8 - 23 mg/dL   Creatinine, Ser 1.40 (H) 0.44 - 1.00 mg/dL   Calcium 10.6 (H) 8.9 - 10.3 mg/dL   Total Protein 7.3 6.5 - 8.1 g/dL   Albumin 4.3 3.5 - 5.0 g/dL   AST 32 15 - 41 U/L   ALT 46 (H) 0 - 44 U/L   Alkaline Phosphatase 98 38 - 126 U/L   Total Bilirubin 1.5 (H) 0.3 - 1.2 mg/dL   GFR calc non Af Amer 39 (L) >60 mL/min   GFR calc Af Amer 45 (L) >60 mL/min   Anion gap 10 5 - 15    Comment: Performed at [facility], [place] [address], [place], [place] [address]  Ethanol     Status: None   Collection Time: 01/12/19  3:42 AM  Result Value Ref Range   Alcohol, Ethyl (B) <10 <10 mg/dL    Comment: (NOTE) Lowest detectable limit for serum alcohol is 10 mg/dL. For medical purposes only. Performed at [facility], [place] [address], [place], [place] [address]   Magnesium     Status: None   Collection Time: 01/12/19  3:42 AM  Result Value Ref Range   Magnesium 2.0 1.7 - 2.4 mg/dL    Comment: Performed at [facility] [address] [place], [place] [address]  Protime-INR     Status: None   Collection Time: 01/12/19  4:20 AM  Result Value Ref Range   Prothrombin Time 13.5 11.4 - 15.2 seconds   INR 1.0 0.8 - 1.2    Comment: (NOTE) INR goal varies based on device and disease states. Performed at [facility], [place] [address], [place], [place] [address]   APTT     Status: None   Collection  Time: 01/12/19  4:20 AM  Result Value Ref Range   aPTT 34 24 - 36 seconds    Comment: Performed at [facility] [address], [place], [place] [address]   CT Head Wo Contrast  Result Date: 01/12/2019 CLINICAL DATA:  Fatigue, jittery and brief right hand numbness 3 days ago. EXAM: CT HEAD WITHOUT CONTRAST TECHNIQUE: Contiguous axial images were obtained from the base of the skull through the vertex without intravenous contrast. COMPARISON:  None. FINDINGS: Brain: The ventricles are normal in size and configuration. No extra-axial fluid collections are identified. The gray-white differentiation is maintained. No CT findings for acute hemispheric infarction or intracranial hemorrhage. No mass lesions. The brainstem and cerebellum are normal. Vascular: Scattered vascular calcifications. No hyperdense vessels or obvious aneurysm. Skull: No acute skull fracture.  No bone lesion. Sinuses/Orbits: The paranasal sinuses and mastoid air cells are clear. The globes are intact. Other: No scalp lesions,  laceration or hematoma. IMPRESSION: Normal head CT. Electronically Signed   By: Marijo Sanes M.D.   On: 01/12/2019 05:54    Assessment: 68 y.o. female presenting with symptoms concerning for transient ischemic attacks. 1. Neurological exam is nonfocal.  2. CT head normal.  3. EKG shows sinus rhythm. 4. Has elevated Cr but no history of renal disease. May need Nephrology consult to evaluate for possible undiagnosed CKD.  5. Stroke Risk Factors - None  Plan: 1. HgbA1c, fasting lipid panel 2. MRI brain 3. CTA of head and neck.  4. PT consult, OT consult, Speech consult 5. TTE. If negative, may need TEE 6. Start ASA 81 mg po qd 7. Risk factor modification 8. Telemetry monitoring 9. Frequent neuro checks 10. Permissive HTN x 24 hours.  11. IVF 12. Hold off on statin pending results of work up.  93. Her brother, who is an ED physician, requests a hypercoagulable work up and Lyme titer (ordered).   @Electronically  signed: Dr. Kerney Elbe 01/12/2019, 6:10 AM

## 2019-01-12 NOTE — Progress Notes (Signed)
PT Cancellation Note  Patient Details Name: Donna Warren MRN: [id number] DOB: [birth year]   Cancelled Treatment:    Reason Eval/Treat Not Completed: PT screened, no needs identified, will sign off; RN reports patient ambulating independent to bathroom and no current needs.  Will sign off.    Reginia Naas 01/12/2019, 4:38 PM Magda Kiel, [place] [phone] 01/12/2019

## 2019-01-12 NOTE — Plan of Care (Signed)
  Problem: Education: Goal: Knowledge of General Education information will improve Description: Including pain rating scale, medication(s)/side effects and non-pharmacologic comfort measures Outcome: Progressing   Problem: Safety: Goal: Ability to remain free from injury will improve Outcome: Progressing   Problem: Education: Goal: Knowledge of disease or condition will improve Outcome: Progressing   Problem: Education: Goal: Knowledge of secondary prevention will improve Outcome: Progressing

## 2019-01-12 NOTE — ED Notes (Signed)
Neurology at the bedside

## 2019-01-12 NOTE — ED Notes (Signed)
ED TO INPATIENT HANDOFF REPORT  ED Nurse Name and Phone #: Celene Squibb RN  S Name/Age/Gender Donna Warren 68 y.o. female Room/Bed: 006C/006C  Code Status   Code Status: Full Code  Home/SNF/Other Home Patient oriented to: self, place, time and situation Is this baseline? Yes   Triage Complete: Triage complete  Chief Complaint Paresthesias [R20.2]  Triage Note Patient reports feeling "Jittery" today with fatigue and brief right hand numbness last Saturday with expressive aphasia . Alert and oriented at arrival , speech clear , no facial asymmetry /no arm drift with equal strong grips.     Allergies Allergies  Allergen Reactions  . Penicillins     Did it involve swelling of the face/tongue/throat, SOB, or low BP? No Did it involve sudden or severe rash/hives, skin peeling, or any reaction on the inside of your mouth or nose? No Did you need to seek medical attention at a hospital or doctor's office? No When did it last happen? If all above answers are "NO", may proceed with cephalosporin use.    Level of Care/Admitting Diagnosis ED Disposition    ED Disposition Condition [facility] Area: [place] [100100]  Level of Care: Telemetry Medical [104]  I expect the patient will be discharged within 24 hours: Yes  LOW acuity---Tx typically complete <24 hrs---ACUTE conditions typically can be evaluated <24 hours---LABS likely to return to acceptable levels <24 hours---IS near functional baseline---EXPECTED to return to current living arrangement---NOT newly hypoxic: Meets criteria for 5C-Observation unit  Covid Evaluation: Asymptomatic Screening Protocol (No Symptoms)  Diagnosis: Paresthesias [id number]  Admitting Physician: Norval Morton [id number]  Attending Physician: Norval Morton [id number]       B Medical/Surgery History Past Medical History:  Diagnosis Date  . Abnormal laboratory test result elevated alpha feto protein   . 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 Arthritis   . Cataract    developing  . Femur fracture, right ([place]) 2003  . Fibroids   . Gilbert's syndrome   . History of liver biopsy nl    records of fatty liver 2008 not confirmed   . Hx of adenomatous colonic polyps   . Meniere disease    Right ear  . Osteoporosis   . Osteoporosis   . Other hemochromatosis    heterozygosity for h63d gene  . Spontaneous abortion    Past Surgical History:  Procedure Laterality Date  . BREAST CYST EXCISION Right   . [place]  . COLONOSCOPY  220, 2004, 2007, 08/13/2010   2002: 8 mm polyp 2004: no polyps 2007: no polyps 2012: hemorrhoids  . hosp r/o mi cv right chest and jaw pain  2010  . LIVER BIOPSY     2015 negative   no fatty liver  records  2008 said fatty liver    . OVARIAN CYST REMOVAL    . POLYPECTOMY    . spontaneously abortion on 3rd preg     triploid dna on path     A IV Location/Drains/Wounds Patient Lines/Drains/Airways Status   Active Line/Drains/Airways    Name:   Placement date:   Placement time:   Site:   Days:   Peripheral IV 01/12/19 Right Antecubital   01/12/19    0335    Antecubital   less than 1          Intake/Output Last 24 hours  Intake/Output Summary (Last 24 hours) at 01/12/2019 1654 Last data filed at 01/12/2019  0632 Gross per 24 hour  Intake 142.21 ml  Output --  Net 142.21 ml    Labs/Imaging Results for orders placed or performed during the hospital encounter of 01/11/19 (from the past 48 hour(s))  Urinalysis, Routine w reflex microscopic     Status: Abnormal   Collection Time: 01/11/19 10:02 PM  Result Value Ref Range   Color, Urine YELLOW YELLOW   APPearance CLEAR CLEAR   Specific Gravity, Urine 1.008 1.005 - 1.030   pH 8.0 5.0 - 8.0   Glucose, UA NEGATIVE NEGATIVE mg/dL   Hgb urine dipstick SMALL (A) NEGATIVE   Bilirubin Urine NEGATIVE NEGATIVE   Ketones, ur NEGATIVE NEGATIVE mg/dL   Protein, ur NEGATIVE  NEGATIVE mg/dL   Nitrite NEGATIVE NEGATIVE   Leukocytes,Ua NEGATIVE NEGATIVE   RBC / HPF 11-20 0 - 5 RBC/hpf   WBC, UA 0-5 0 - 5 WBC/hpf   Bacteria, UA NONE SEEN NONE SEEN    Comment: Performed at [facility] [address], [place], [place] [address]  CBC with Differential     Status: None   Collection Time: 01/11/19 11:09 PM  Result Value Ref Range   WBC 7.2 4.0 - 10.5 K/uL   RBC 3.97 3.87 - 5.11 MIL/uL   Hemoglobin 13.0 12.0 - 15.0 g/dL   HCT 36.7 36.0 - 46.0 %   MCV 92.4 80.0 - 100.0 fL   MCH 32.7 26.0 - 34.0 pg   MCHC 35.4 30.0 - 36.0 g/dL   RDW 11.5 11.5 - 15.5 %   Platelets 279 150 - 400 K/uL   nRBC 0.0 0.0 - 0.2 %   Neutrophils Relative % 55 %   Neutro Abs 4.0 1.7 - 7.7 K/uL   Lymphocytes Relative 34 %   Lymphs Abs 2.4 0.7 - 4.0 K/uL   Monocytes Relative 8 %   Monocytes Absolute 0.5 0.1 - 1.0 K/uL   Eosinophils Relative 2 %   Eosinophils Absolute 0.2 0.0 - 0.5 K/uL   Basophils Relative 1 %   Basophils Absolute 0.0 0.0 - 0.1 K/uL   Immature Granulocytes 0 %   Abs Immature Granulocytes 0.01 0.00 - 0.07 K/uL    Comment: Performed at [facility], [address], [place], [place] [address]  Comprehensive metabolic panel     Status: Abnormal   Collection Time: 01/11/19 11:09 PM  Result Value Ref Range   Sodium 140 135 - 145 mmol/L   Potassium 3.2 (L) 3.5 - 5.1 mmol/L   Chloride 99 98 - 111 mmol/L   CO2 31 22 - 32 mmol/L   Glucose, Bld 116 (H) 70 - 99 mg/dL   BUN 19 8 - 23 mg/dL   Creatinine, Ser 1.40 (H) 0.44 - 1.00 mg/dL   Calcium 10.6 (H) 8.9 - 10.3 mg/dL   Total Protein 7.3 6.5 - 8.1 g/dL   Albumin 4.3 3.5 - 5.0 g/dL   AST 32 15 - 41 U/L   ALT 46 (H) 0 - 44 U/L   Alkaline Phosphatase 98 38 - 126 U/L   Total Bilirubin 1.5 (H) 0.3 - 1.2 mg/dL   GFR calc non Af Amer 39 (L) >60 mL/min   GFR calc Af Amer 45 (L) >60 mL/min   Anion gap 10 5 - 15    Comment: Performed at [facility], [place] [address] [place], [place] [address]  Ethanol      Status: None   Collection Time: 01/12/19  3:42 AM  Result Value  Ref Range   Alcohol, Ethyl (B) <10 <10 mg/dL    Comment: (NOTE) Lowest detectable limit for serum alcohol is 10 mg/dL. For medical purposes only. Performed at [facility], [place] [address] [place], [place] [address]   Magnesium     Status: None   Collection Time: 01/12/19  3:42 AM  Result Value Ref Range   Magnesium 2.0 1.7 - 2.4 mg/dL    Comment: Performed at [facility] [address], [place], [place] [address]  Protime-INR     Status: None   Collection Time: 01/12/19  4:20 AM  Result Value Ref Range   Prothrombin Time 13.5 11.4 - 15.2 seconds   INR 1.0 0.8 - 1.2    Comment: (NOTE) INR goal varies based on device and disease states. Performed at [facility], [place] [address] [place], [place] [address]   APTT     Status: None   Collection Time: 01/12/19  4:20 AM  Result Value Ref Range   aPTT 34 24 - 36 seconds    Comment: Performed at [facility] [address] [place], [place] [address]  Urine rapid drug screen (hosp performed)     Status: None   Collection Time: 01/12/19  7:48 AM  Result Value Ref Range   Opiates NONE DETECTED NONE DETECTED   Cocaine NONE DETECTED NONE DETECTED   Benzodiazepines NONE DETECTED NONE DETECTED   Amphetamines NONE DETECTED NONE DETECTED   Tetrahydrocannabinol NONE DETECTED NONE DETECTED   Barbiturates NONE DETECTED NONE DETECTED    Comment: (NOTE) DRUG SCREEN FOR MEDICAL PURPOSES ONLY.  IF CONFIRMATION IS NEEDED FOR ANY PURPOSE, NOTIFY LAB WITHIN 5 DAYS. LOWEST DETECTABLE LIMITS FOR URINE DRUG SCREEN Drug Class                     Cutoff (ng/mL) Amphetamine and metabolites    1000 Barbiturate and metabolites    200 Benzodiazepine                 [id number] Tricyclics and metabolites     300 Opiates and metabolites        300 Cocaine and metabolites        300 THC                            50 Performed at [facility], [place] [address] [place], [place] [address]   Creatinine, urine, random     Status: None   Collection Time: 01/12/19  8:06 AM  Result Value Ref Range   Creatinine, Urine 50.81 mg/dL    Comment: Performed at [facility] [address], [place], [place] [address]  Sodium, urine, random     Status: None   Collection Time: 01/12/19  8:06 AM  Result Value Ref Range   Sodium, Ur 55 mmol/L    Comment: Performed at [facility] [address], [place], [place] [address]  SARS CORONAVIRUS 2 (TAT 6-24 HRS) Nasopharyngeal Nasopharyngeal Swab     Status: None   Collection Time: 01/12/19  8:11 AM   Specimen: Nasopharyngeal Swab  Result Value Ref Range   SARS Coronavirus 2 NEGATIVE NEGATIVE    Comment: (NOTE) SARS-CoV-2 target nucleic acids are NOT DETECTED. The SARS-CoV-2 RNA is generally detectable in upper and lower respiratory specimens during the acute phase of infection. Negative results do not preclude SARS-CoV-2 infection, do not  rule out co-infections with other pathogens, and should not be used as the sole basis for treatment or other patient management decisions. Negative results must be combined with clinical observations, patient history, and epidemiological information. The expected result is Negative. Fact Sheet for Patients: SugarRoll.be Fact Sheet for Healthcare Providers: https://www.woods-mathews.com/ This test is not yet approved or cleared by the [place] FDA and  has been authorized for detection and/or diagnosis of SARS-CoV-2 by FDA under an Emergency Use Authorization (EUA). This EUA will remain  in effect (meaning this test can be used) for the duration of the COVID-19 declaration under Section 56 4(b)(1) of the Act, 21 U.S.C. section 360bbb-3(b)(1), unless the authorization is terminated or revoked sooner. Performed at [facility], [place] [address], [place], [place] [address]   HIV Antibody (routine testing w rflx)      Status: None   Collection Time: 01/12/19  9:17 AM  Result Value Ref Range   HIV Screen 4th Generation wRfx NON REACTIVE NON REACTIVE    Comment: Performed at [facility] [address], [place], [place] [address]  Antithrombin III     Status: Abnormal   Collection Time: 01/12/19  9:17 AM  Result Value Ref Range   AntiThromb III Func 136 (H) 75 - 120 %    Comment: Performed at [facility] [address] [place],  [address]   CT ANGIO HEAD W OR WO CONTRAST  Result Date: 01/12/2019 CLINICAL DATA:  Stroke, follow-up. Fatigue. Patient feels jittery today. Brief episode of right hand numbness 3 days ago. EXAM: CT ANGIOGRAPHY HEAD AND NECK TECHNIQUE: Multidetector CT imaging of the head and neck was performed using the standard protocol during bolus administration of intravenous contrast. Multiplanar CT image reconstructions and MIPs were obtained to evaluate the vascular anatomy. Carotid stenosis measurements (when applicable) are obtained utilizing NASCET criteria, using the distal internal carotid diameter as the denominator. CONTRAST:  91mL OMNIPAQUE IOHEXOL 350 MG/ML SOLN COMPARISON:  None. FINDINGS: CTA NECK FINDINGS Aortic arch: A three-vessel aortic arch is present. No significant atherosclerotic calcifications are present. There is no aneurysm or stenosis. No dissection is present. Right carotid system: The right common carotid artery is within normal limits. Bifurcation is unremarkable. Cervical right ICA is normal. Left carotid system: The left common carotid artery is within normal limits. Bifurcation is unremarkable. Cervical left ICA is normal. Vertebral arteries: The vertebral arteries are codominant. Both vertebral arteries originate from the subclavian arteries without significant stenosis. There is no significant stenosis of either vertebral artery in the neck. Skeleton: Mild endplate degenerative changes are present in the cervical spine. Mild osseous foraminal  narrowing is present on the left at C6-7. No focal lytic or blastic lesions are present. Other neck: The soft tissues the neck are otherwise unremarkable. Thyroid is within normal limits. Salivary glands are normal. No significant adenopathy is present. Upper chest: The lung apices are clear. Thoracic inlet is within normal limits. Review of the MIP images confirms the above findings CTA HEAD FINDINGS Anterior circulation: Atherosclerotic calcifications are present within the cavernous internal carotid arteries bilaterally. There is mild narrowing of the supraclinoid left ICA, less than 50% relative to the more distal vessel. The ICA termini are within normal limits bilaterally. The A1 and M1 segments are normal. The anterior communicating artery is patent. MCA bifurcations are intact. The ACA and MCA branch vessels are within normal limits. Posterior circulation: The vertebral arteries are codominant. PICA origins are visualized and normal  bilaterally. Vertebrobasilar junction is normal. Basilar artery is normal. Both posterior cerebral arteries originate from the basilar tip. A prominent left posterior communicating artery contributes. The posterior cerebral arteries and branch vessels are within normal limits. Venous sinuses: The dural sinuses are patent. The straight sinus deep cerebral veins are intact. Cortical veins are unremarkable. Anatomic variants: None Review of the MIP images confirms the above findings IMPRESSION: 1. Normal variant CTA Circle of Willis without significant proximal stenosis, aneurysm, or branch vessel occlusion. 2. Mild atherosclerotic changes within the cavernous internal carotid arteries bilaterally without significant stenosis. Electronically Signed   By: San Morelle M.D.   On: 01/12/2019 09:25   DG Chest 2 View  Result Date: 01/12/2019 CLINICAL DATA:  TIA.S PAST FEW WEEKS WITH RIGHT ARM AFFECTED,SPEECH AFFECTED-OK NOW EXAM: CHEST - 2 VIEW COMPARISON:  09/16/2008  FINDINGS: Cardiac silhouette is normal in size and configuration. Normal mediastinal and hilar contours. Clear lungs.  No pleural effusion or pneumothorax. Skeletal structures are unremarkable. IMPRESSION: No active cardiopulmonary disease. Electronically Signed   By: Lajean Manes M.D.   On: 01/12/2019 08:45   CT Head Wo Contrast  Result Date: 01/12/2019 CLINICAL DATA:  Fatigue, jittery and brief right hand numbness 3 days ago. EXAM: CT HEAD WITHOUT CONTRAST TECHNIQUE: Contiguous axial images were obtained from the base of the skull through the vertex without intravenous contrast. COMPARISON:  None. FINDINGS: Brain: The ventricles are normal in size and configuration. No extra-axial fluid collections are identified. The gray-white differentiation is maintained. No CT findings for acute hemispheric infarction or intracranial hemorrhage. No mass lesions. The brainstem and cerebellum are normal. Vascular: Scattered vascular calcifications. No hyperdense vessels or obvious aneurysm. Skull: No acute skull fracture.  No bone lesion. Sinuses/Orbits: The paranasal sinuses and mastoid air cells are clear. The globes are intact. Other: No scalp lesions, laceration or hematoma. IMPRESSION: Normal head CT. Electronically Signed   By: Marijo Sanes M.D.   On: 01/12/2019 05:54   CT ANGIO NECK W OR WO CONTRAST  Result Date: 01/12/2019 CLINICAL DATA:  Stroke, follow-up. Fatigue. Patient feels jittery today. Brief episode of right hand numbness 3 days ago. EXAM: CT ANGIOGRAPHY HEAD AND NECK TECHNIQUE: Multidetector CT imaging of the head and neck was performed using the standard protocol during bolus administration of intravenous contrast. Multiplanar CT image reconstructions and MIPs were obtained to evaluate the vascular anatomy. Carotid stenosis measurements (when applicable) are obtained utilizing NASCET criteria, using the distal internal carotid diameter as the denominator. CONTRAST:  46mL OMNIPAQUE IOHEXOL 350  MG/ML SOLN COMPARISON:  None. FINDINGS: CTA NECK FINDINGS Aortic arch: A three-vessel aortic arch is present. No significant atherosclerotic calcifications are present. There is no aneurysm or stenosis. No dissection is present. Right carotid system: The right common carotid artery is within normal limits. Bifurcation is unremarkable. Cervical right ICA is normal. Left carotid system: The left common carotid artery is within normal limits. Bifurcation is unremarkable. Cervical left ICA is normal. Vertebral arteries: The vertebral arteries are codominant. Both vertebral arteries originate from the subclavian arteries without significant stenosis. There is no significant stenosis of either vertebral artery in the neck. Skeleton: Mild endplate degenerative changes are present in the cervical spine. Mild osseous foraminal narrowing is present on the left at C6-7. No focal lytic or blastic lesions are present. Other neck: The soft tissues the neck are otherwise unremarkable. Thyroid is within normal limits. Salivary glands are normal. No significant adenopathy is present. Upper chest: The lung apices are clear. Thoracic inlet  is within normal limits. Review of the MIP images confirms the above findings CTA HEAD FINDINGS Anterior circulation: Atherosclerotic calcifications are present within the cavernous internal carotid arteries bilaterally. There is mild narrowing of the supraclinoid left ICA, less than 50% relative to the more distal vessel. The ICA termini are within normal limits bilaterally. The A1 and M1 segments are normal. The anterior communicating artery is patent. MCA bifurcations are intact. The ACA and MCA branch vessels are within normal limits. Posterior circulation: The vertebral arteries are codominant. PICA origins are visualized and normal bilaterally. Vertebrobasilar junction is normal. Basilar artery is normal. Both posterior cerebral arteries originate from the basilar tip. A prominent left  posterior communicating artery contributes. The posterior cerebral arteries and branch vessels are within normal limits. Venous sinuses: The dural sinuses are patent. The straight sinus deep cerebral veins are intact. Cortical veins are unremarkable. Anatomic variants: None Review of the MIP images confirms the above findings IMPRESSION: 1. Normal variant CTA Circle of Willis without significant proximal stenosis, aneurysm, or branch vessel occlusion. 2. Mild atherosclerotic changes within the cavernous internal carotid arteries bilaterally without significant stenosis. Electronically Signed   By: San Morelle M.D.   On: 01/12/2019 09:25   MR BRAIN WO CONTRAST  Result Date: 01/12/2019 CLINICAL DATA:  TIA. Multiple episodes of dysarthria in the past 3 months with the more recent episodes being associated with right hand paresthesias and weakness. EXAM: MRI HEAD WITHOUT CONTRAST TECHNIQUE: Multiplanar, multiecho pulse sequences of the brain and surrounding structures were obtained without intravenous contrast. COMPARISON:  Head CT 01/12/2019 FINDINGS: Brain: There is no evidence of acute infarct, intracranial hemorrhage, mass, midline shift, or extra-axial fluid collection. The ventricles and sulci are normal. A few punctate foci of T2 hyperintensity in the cerebral white matter are within normal limits for age. Vascular: Major intracranial vascular flow voids are preserved. Skull and upper cervical spine: Unremarkable bone marrow signal. Sinuses/Orbits: Unremarkable orbits. Paranasal sinuses and mastoid air cells are clear. Other: None. IMPRESSION: Negative brain MRI. Electronically Signed   By: Logan Bores M.D.   On: 01/12/2019 12:52   ECHOCARDIOGRAM COMPLETE  Result Date: 01/12/2019   ECHOCARDIOGRAM REPORT   Patient Name:   Donna Warren Date of Exam: 01/12/2019 Medical Rec #:  [id number]    Height:       64.0 in Accession #:    [id number]   Weight:       122.0 lb Date of Birth:  Jul 08, 1950    BSA:           1.59 m Patient Age:    89 years     BP:           101/65 mmHg Patient Gender: F            HR:           73 bpm. Exam Location:  Inpatient Procedure: 2D Echo Indications:    TIA 435.9 / G45.9  History:        Patient has no prior history of Echocardiogram examinations.                 Neurologic changes.  Sonographer:    Darlina Sicilian RDCS Referring Phys: [id number] [place]  1. Left ventricular ejection fraction, by visual estimation, is 55 to 60%. The left ventricle has normal function. There is no left ventricular hypertrophy.  2. The left ventricle has no regional wall motion abnormalities.  3. Global right ventricle has normal systolic function.The  right ventricular size is normal. No increase in right ventricular wall thickness.  4. Left atrial size was normal.  5. Right atrial size was normal.  6. The mitral valve is normal in structure. No evidence of mitral valve regurgitation.  7. The tricuspid valve is normal in structure. Tricuspid valve regurgitation is mild.  8. The aortic valve is normal in structure. Aortic valve regurgitation is not visualized.  9. The pulmonic valve was grossly normal. Pulmonic valve regurgitation is trivial. 10. Normal pulmonary artery systolic pressure. 11. The inferior vena cava is normal in size with greater than 50% respiratory variability, suggesting right atrial pressure of 3 mmHg. FINDINGS  Left Ventricle: Left ventricular ejection fraction, by visual estimation, is 55 to 60%. The left ventricle has normal function. The left ventricle has no regional wall motion abnormalities. The left ventricular internal cavity size was the left ventricle is normal in size. There is no left ventricular hypertrophy. Left ventricular diastolic parameters were normal. Normal left atrial pressure. Right Ventricle: The right ventricular size is normal. No increase in right ventricular wall thickness. Global RV systolic function is has normal systolic function. The  tricuspid regurgitant velocity is 2.27 m/s, and with an assumed right atrial pressure  of 3 mmHg, the estimated right ventricular systolic pressure is normal at 23.7 mmHg. Left Atrium: Left atrial size was normal in size. Right Atrium: Right atrial size was normal in size Pericardium: There is no evidence of pericardial effusion. Mitral Valve: The mitral valve is normal in structure. No evidence of mitral valve regurgitation. Tricuspid Valve: The tricuspid valve is normal in structure. Tricuspid valve regurgitation is mild. Aortic Valve: The aortic valve is normal in structure. Aortic valve regurgitation is not visualized. Pulmonic Valve: The pulmonic valve was grossly normal. Pulmonic valve regurgitation is trivial. Pulmonic regurgitation is trivial. Aorta: The aortic root, ascending aorta and aortic arch are all structurally normal, with no evidence of dilitation or obstruction. Venous: The inferior vena cava is normal in size with greater than 50% respiratory variability, suggesting right atrial pressure of 3 mmHg. IAS/Shunts: No atrial level shunt detected by color flow Doppler.  LEFT VENTRICLE PLAX 2D LVIDd:         4.13 cm  Diastology LVIDs:         2.33 cm  LV e' lateral:   8.05 cm/s LV PW:         0.76 cm  LV E/e' lateral: 6.1 LV IVS:        1.08 cm  LV e' medial:    5.44 cm/s LVOT diam:     1.60 cm  LV E/e' medial:  9.1 LV SV:         57 ml LV SV Index:   35.93 LVOT Area:     2.01 cm  RIGHT VENTRICLE RV S prime:     12.30 cm/s TAPSE (M-mode): 1.9 cm LEFT ATRIUM             Index       RIGHT ATRIUM           Index LA diam:        2.60 cm 1.64 cm/m  RA Area:     10.70 cm LA Vol (A2C):   26.1 ml 16.46 ml/m RA Volume:   23.60 ml  14.88 ml/m LA Vol (A4C):   25.6 ml 16.14 ml/m LA Biplane Vol: 27.0 ml 17.03 ml/m  AORTIC VALVE LVOT Vmax:   95.30 cm/s LVOT Vmean:  61.600 cm/s LVOT VTI:  0.174 m  AORTA Ao Root diam: 2.80 cm MITRAL VALVE                        TRICUSPID VALVE MV Area (PHT): 3.19 cm              TR Peak grad:   20.7 mmHg MV PHT:        69.02 msec           TR Vmax:        231.00 cm/s MV Decel Time: 238 msec MV E velocity: 49.30 cm/s 103 cm/s  SHUNTS MV A velocity: 45.40 cm/s 70.3 cm/s Systemic VTI:  0.17 m MV E/A ratio:  1.09       1.5       Systemic Diam: 1.60 cm  Dani Gobble Croitoru MD Electronically signed by Sanda Klein MD Signature Date/Time: 01/12/2019/1:13:51 PM    Final     Pending Labs Unresulted Labs (From admission, onward)    Start     Ordered   01/13/19 0500  Hemoglobin A1c  Tomorrow morning,   R     01/12/19 0805   01/13/19 0500  Lipid panel  Tomorrow morning,   R    Comments: Fasting    01/12/19 0805   01/13/19 [id number]  Basic metabolic panel  Tomorrow morning,   R     01/12/19 0807   01/12/19 0828  B. burgdorfi antibodies  Once,   STAT    Question:  Specimen collection method  Answer:  IV Team   01/12/19 0827   01/12/19 0828  Protein C activity  (Hypercoagulable Panel, Comprehensive (PNL))  Once,   STAT     01/12/19 0827   01/12/19 0828  Protein C, total  (Hypercoagulable Panel, Comprehensive (PNL))  Once,   STAT     01/12/19 0827   01/12/19 0828  Protein S activity  (Hypercoagulable Panel, Comprehensive (PNL))  Once,   STAT     01/12/19 0827   01/12/19 0828  Protein S, total  (Hypercoagulable Panel, Comprehensive (PNL))  Once,   STAT     01/12/19 0827   01/12/19 0828  Lupus anticoagulant panel  (Hypercoagulable Panel, Comprehensive (PNL))  Once,   STAT     01/12/19 0827   01/12/19 0828  Beta-2-glycoprotein i abs, IgG/M/A  (Hypercoagulable Panel, Comprehensive (PNL))  Once,   STAT     01/12/19 0827   01/12/19 0828  Homocysteine, serum  (Hypercoagulable Panel, Comprehensive (PNL))  Once,   STAT     01/12/19 0827   01/12/19 0828  Factor 5 leiden  (Hypercoagulable Panel, Comprehensive (PNL))  Once,   STAT     01/12/19 0827   01/12/19 0828  Prothrombin gene mutation  (Hypercoagulable Panel, Comprehensive (PNL))  Once,   STAT     01/12/19 0827   01/12/19 0828   Cardiolipin antibodies, IgG, IgM, IgA  (Hypercoagulable Panel, Comprehensive (PNL))  Once,   STAT     01/12/19 0827   01/12/19 0807  Urea nitrogen, urine  ONCE - STAT,   STAT     01/12/19 0806          Vitals/Pain Today's Vitals   01/12/19 0739 01/12/19 0748 01/12/19 0929 01/12/19 1103  BP:  (!) 118/92 101/65   Pulse:  76 72   Resp:  16 16   Temp:   97.8 F (36.6 C)   TempSrc:   Oral   SpO2:  99%    Weight:  Height:      PainSc: 0-No pain 0-No pain  0-No pain    Isolation Precautions No active isolations  Medications Medications   stroke: mapping our early stages of recovery book (has no administration in time range)  0.9 %  sodium chloride infusion ( Intravenous New Bag/Given 01/12/19 1103)  acetaminophen (TYLENOL) tablet 650 mg (has no administration in time range)    Or  acetaminophen (TYLENOL) 160 MG/5ML solution 650 mg (has no administration in time range)    Or  acetaminophen (TYLENOL) suppository 650 mg (has no administration in time range)  senna-docusate (Senokot-S) tablet 1 tablet (has no administration in time range)  enoxaparin (LOVENOX) injection 40 mg (has no administration in time range)  ondansetron (ZOFRAN) injection 4 mg (has no administration in time range)  aspirin EC tablet 81 mg (81 mg Oral Given 01/12/19 1055)  potassium chloride SA (KLOR-CON) CR tablet 40 mEq (40 mEq Oral Given 01/12/19 0344)  potassium chloride 10 mEq in 100 mL IVPB ( Intravenous Stopped 01/12/19 0552)  magnesium sulfate IVPB 2 g 50 mL ( Intravenous Stopped 01/12/19 0447)  iohexol (OMNIPAQUE) 350 MG/ML injection 50 mL (50 mLs Intravenous Contrast Given 01/12/19 0914)    Mobility walks Low fall risk   Focused Assessments Neuro Assessment Handoff:  Swallow screen pass? Yes    NIH Stroke Scale ( + Modified Stroke Scale Criteria)  Interval: Shift assessment Level of Consciousness (1a.)   : Alert, keenly responsive LOC Questions (1b. )   +: Answers both questions  correctly LOC Commands (1c. )   + : Performs both tasks correctly Best Gaze (2. )  +: Normal Visual (3. )  +: No visual loss Facial Palsy (4. )    : Normal symmetrical movements Motor Arm, Left (5a. )   +: No drift Motor Arm, Right (5b. )   +: No drift Motor Leg, Left (6a. )   +: No drift Motor Leg, Right (6b. )   +: No drift Limb Ataxia (7. ): Absent Sensory (8. )   +: Normal, no sensory loss Best Language (9. )   +: No aphasia Dysarthria (10. ): Normal Extinction/Inattention (11.)   +: No Abnormality Modified SS Total  +: 0 Complete NIHSS TOTAL: 0     Neuro Assessment: Within Defined Limits Neuro Checks:   Shift assessment (01/12/19 0750)  Last Documented NIHSS Modified Score: 0 (01/12/19 1104) Has TPA been given? No If patient is a Neuro Trauma and patient is going to OR before floor call report to [facility] nurse: [phone] or [phone]     R Recommendations: See Admitting Provider Note  Report given to:   Additional Notes:

## 2019-01-13 ENCOUNTER — Observation Stay (HOSPITAL_COMMUNITY): Payer: PPO

## 2019-01-13 ENCOUNTER — Telehealth: Payer: Self-pay

## 2019-01-13 DIAGNOSIS — G459 Transient cerebral ischemic attack, unspecified: Secondary | ICD-10-CM | POA: Diagnosis not present

## 2019-01-13 DIAGNOSIS — R202 Paresthesia of skin: Secondary | ICD-10-CM | POA: Diagnosis not present

## 2019-01-13 LAB — LIPID PANEL
Cholesterol: 262 mg/dL — ABNORMAL HIGH (ref 0–200)
HDL: 64 mg/dL (ref >40)
LDL Cholesterol: 185 mg/dL — ABNORMAL HIGH (ref 0–99)
Total CHOL/HDL Ratio: 4.1 RATIO
Triglycerides: 64 mg/dL (ref <150)
VLDL: 13 mg/dL (ref 0–40)

## 2019-01-13 LAB — CBC WITH DIFFERENTIAL/PLATELET
Abs Immature Granulocytes: 0.01 10*3/uL (ref 0.00–0.07)
Basophils Absolute: 0 10*3/uL (ref 0.0–0.1)
Basophils Relative: 1 %
Eosinophils Absolute: 0.1 10*3/uL (ref 0.0–0.5)
Eosinophils Relative: 2 %
HCT: 34.4 % — ABNORMAL LOW (ref 36.0–46.0)
Hemoglobin: 12.3 g/dL (ref 12.0–15.0)
Immature Granulocytes: 0 %
Lymphocytes Relative: 40 %
Lymphs Abs: 1.5 10*3/uL (ref 0.7–4.0)
MCH: 33 pg (ref 26.0–34.0)
MCHC: 35.8 g/dL (ref 30.0–36.0)
MCV: 92.2 fL (ref 80.0–100.0)
Monocytes Absolute: 0.3 10*3/uL (ref 0.1–1.0)
Monocytes Relative: 7 %
Neutro Abs: 1.9 10*3/uL (ref 1.7–7.7)
Neutrophils Relative %: 50 %
Platelets: 234 10*3/uL (ref 150–400)
RBC: 3.73 MIL/uL — ABNORMAL LOW (ref 3.87–5.11)
RDW: 11.6 % (ref 11.5–15.5)
WBC: 3.8 10*3/uL — ABNORMAL LOW (ref 4.0–10.5)
nRBC: 0 % (ref 0.0–0.2)

## 2019-01-13 LAB — BASIC METABOLIC PANEL
Anion gap: 11 (ref 5–15)
BUN: 10 mg/dL (ref 8–23)
CO2: 26 mmol/L (ref 22–32)
Calcium: 9.3 mg/dL (ref 8.9–10.3)
Chloride: 102 mmol/L (ref 98–111)
Creatinine, Ser: 1.06 mg/dL — ABNORMAL HIGH (ref 0.44–1.00)
GFR calc Af Amer: >60 mL/min (ref >60)
GFR calc non Af Amer: 54 mL/min — ABNORMAL LOW (ref >60)
Glucose, Bld: 88 mg/dL (ref 70–99)
Potassium: 3.8 mmol/L (ref 3.5–5.1)
Sodium: 139 mmol/L (ref 135–145)

## 2019-01-13 LAB — LUPUS ANTICOAGULANT PANEL
DRVVT: 34.1 s (ref 0.0–47.0)
PTT Lupus Anticoagulant: 37.3 s (ref 0.0–51.9)

## 2019-01-13 LAB — B. BURGDORFI ANTIBODIES: B burgdorferi Ab IgG+IgM: <0.91 {ISR} (ref 0.00–0.90)

## 2019-01-13 LAB — CARDIOLIPIN ANTIBODIES, IGG, IGM, IGA
Anticardiolipin IgA: <9 APL U/mL (ref 0–11)
Anticardiolipin IgG: <9 GPL U/mL (ref 0–14)
Anticardiolipin IgM: <9 MPL U/mL (ref 0–12)

## 2019-01-13 LAB — UREA NITROGEN, URINE: Urea Nitrogen, Ur: 330 mg/dL

## 2019-01-13 LAB — BETA-2-GLYCOPROTEIN I ABS, IGG/M/A
Beta-2 Glyco I IgG: <9 GPI IgG units (ref 0–20)
Beta-2-Glycoprotein I IgA: <9 GPI IgA units (ref 0–25)
Beta-2-Glycoprotein I IgM: <9 GPI IgM units (ref 0–32)

## 2019-01-13 LAB — PROTEIN S ACTIVITY: Protein S Activity: 74 % (ref 63–140)

## 2019-01-13 LAB — HOMOCYSTEINE: Homocysteine: 21 umol/L — ABNORMAL HIGH (ref 0.0–17.2)

## 2019-01-13 LAB — HEMOGLOBIN A1C
Hgb A1c MFr Bld: 5.2 % (ref 4.8–5.6)
Mean Plasma Glucose: 102.54 mg/dL

## 2019-01-13 LAB — PROTEIN S, TOTAL: Protein S Ag, Total: 109 % (ref 60–150)

## 2019-01-13 LAB — PROTEIN C ACTIVITY: Protein C Activity: 183 % — ABNORMAL HIGH (ref 73–180)

## 2019-01-13 MED ORDER — LEVETIRACETAM ER 500 MG PO TB24: 500.0000 mg | ORAL_TABLET | Freq: Every day | ORAL | Status: DC

## 2019-01-13 MED ORDER — DIVALPROEX SODIUM ER 500 MG PO TB24: 500.0000 mg | ORAL_TABLET | Freq: Every day | ORAL | 1 refills | Status: DC

## 2019-01-13 MED ORDER — DIVALPROEX SODIUM ER 500 MG PO TB24
500.0000 mg | ORAL_TABLET | Freq: Every day | ORAL | Status: DC
  Administered 2019-01-13: 500 mg via ORAL
  Filled 2019-01-13: qty 1

## 2019-01-13 NOTE — Discharge Instructions (Signed)
Seizure, Adult A seizure is a sudden burst of abnormal electrical activity in the brain. Seizures usually last from 30 seconds to 2 minutes. They can cause many different symptoms. Usually, seizures are not harmful unless they last a long time. What are the causes? Common causes of this condition include:  Fever or infection.  Conditions that affect the brain, such as: ? A brain abnormality that you were born with. ? A brain or head injury. ? Bleeding in the brain. ? A tumor. ? Stroke. ? Brain disorders such as autism or cerebral palsy.  Low blood sugar.  Conditions that are passed from parent to child (are inherited).  Problems with substances, such as: ? Having a reaction to a drug or a medicine. ? Suddenly stopping the use of a substance (withdrawal). In some cases, the cause may not be known. A person who has repeated seizures over time without a clear cause has a condition called epilepsy. What increases the risk? You are more likely to get this condition if you have:  A family history of epilepsy.  Had a seizure in the past.  A brain disorder.  A history of head injury, lack of oxygen at birth, or strokes. What are the signs or symptoms? There are many types of seizures. The symptoms vary depending on the type of seizure you have. Examples of symptoms during a seizure include:  Shaking (convulsions).  Stiffness in the body.  Passing out (losing consciousness).  Head nodding.  Staring.  Not responding to sound or touch.  Loss of bladder control and bowel control. Some people have symptoms right before and right after a seizure happens. Symptoms before a seizure may include:  Fear.  Worry (anxiety).  Feeling like you may vomit (nauseous).  Feeling like the room is spinning (vertigo).  Feeling like you saw or heard something before (dj vu).  Odd tastes or smells.  Changes in how you see. You may see flashing lights or spots. Symptoms after a  seizure happens can include:  Confusion.  Sleepiness.  Headache.  Weakness on one side of the body. How is this treated? Most seizures will stop on their own in under 5 minutes. In these cases, no treatment is needed. Seizures that last longer than 5 minutes will usually need treatment. Treatment can include:  Medicines given through an IV tube.  Avoiding things that are known to cause your seizures. These can include medicines that you take for another condition.  Medicines to treat epilepsy.  Surgery to stop the seizures. This may be needed if medicines do not help. Follow these instructions at home: Medicines  Take over-the-counter and prescription medicines only as told by your doctor.  Do not eat or drink anything that may keep your medicine from working, such as alcohol. Activity  Do not do any activities that would be dangerous if you had another seizure, like driving or swimming. Wait until your doctor says it is safe for you to do them.  If you live in the [place] ask your local DMV (department of motor vehicles) when you can drive.  Get plenty of rest. Teaching others Teach friends and family what to do when you have a seizure. They should:  Lay you on the ground.  Protect your head and body.  Loosen any tight clothing around your neck.  Turn you on your side.  Not hold you down.  Not put anything into your mouth.  Know whether or not you need emergency care.  Stay   with you until you are better.  General instructions  Contact your doctor each time you have a seizure.  Avoid anything that gives you seizures.  Keep a seizure diary. Write down: ? What you think caused each seizure. ? What you remember about each seizure.  Keep all follow-up visits as told by your doctor. This is important. Contact a doctor if:  You have another seizure.  You have seizures more often.  There is any change in what happens during your seizures.  You keep having  seizures with treatment.  You have symptoms of being sick or having an infection. Get help right away if:  You have a seizure that: ? Lasts longer than 5 minutes. ? Is different than seizures you had before. ? Makes it harder to breathe. ? Happens after you hurt your head.  You have any of these symptoms after a seizure: ? Not being able to speak. ? Not being able to use a part of your body. ? Confusion. ? A bad headache.  You have two or more seizures in a row.  You do not wake up right after a seizure.  You get hurt during a seizure. These symptoms may be an emergency. Do not wait to see if the symptoms will go away. Get medical help right away. Call your local emergency services (911 in the [place] Do not drive yourself to the hospital. Summary  Seizures usually last from 30 seconds to 2 minutes. Usually, they are not harmful unless they last a long time.  Do not eat or drink anything that may keep your medicine from working, such as alcohol.  Teach friends and family what to do when you have a seizure.  Contact your doctor each time you have a seizure. This information is not intended to replace advice given to you by your health care provider. Make sure you discuss any questions you have with your health care provider. Document Released: 06/26/2007 Document Revised: 03/27/2018 Document Reviewed: 03/27/2018 Elsevier Patient Education  2020 [employer]  

## 2019-01-13 NOTE — Progress Notes (Signed)
NURSING PROGRESS NOTE  Donna Warren [id number] Discharge Data: 01/13/2019 3:18 PM Attending Provider: Darliss Cheney, MD [id number], Standley Brooking, MD     [place]ell [place] to be D/C'd Home per MD order.  Discussed with the patient the After Visit Summary and all questions fully answered. All IV's discontinued with no bleeding noted. All belongings returned to patient for patient to take home.   Last Vital Signs:  Blood pressure 96/86, pulse 69, temperature 98.4 F (36.9 C), temperature source Oral, resp. rate 18, height 5\' 4"  (1.626 m), weight 55.3 kg, SpO2 100 %.  Discharge Medication List Allergies as of 01/13/2019      Reactions   Penicillins    Did it involve swelling of the face/tongue/throat, SOB, or low BP? No Did it involve sudden or severe rash/hives, skin peeling, or any reaction on the inside of your mouth or nose? No Did you need to seek medical attention at a hospital or doctor's office? No When did it last happen? If all above answers are "NO", may proceed with cephalosporin use.      Medication List    TAKE these medications   Calcium 600/Vitamin D3 600-800 MG-UNIT Tabs Generic drug: Calcium Carb-Cholecalciferol Take 1 tablet by mouth daily. Notes to patient: 01/13/2019   chlorthalidone 25 MG tablet Commonly known as: HYGROTON TAKE 1 TABLET BY MOUTH EVERY DAY What changed: how much to take Notes to patient: 01/14/2019   cholecalciferol 25 MCG (1000 UT) tablet Commonly known as: VITAMIN D3 Take 1,000 Units by mouth daily. Notes to patient: 01/13/2019   citalopram 10 MG tablet Commonly known as: CELEXA Take 1 tablet (10 mg total) by mouth daily. Notes to patient: 01/14/2019   diazepam 2 MG tablet Commonly known as: VALIUM Take 2 mg by mouth as needed (for Meniere's flare up).   diclofenac sodium 1 % Gel Commonly known as: VOLTAREN Apply 3 g to 3 large joints up to 3 times a day. Notes to patient: 01/13/2019   divalproex 500 MG 24 hr tablet Commonly  known as: DEPAKOTE ER Take 1 tablet (500 mg total) by mouth daily. Notes to patient: 01/13/2019   LORazepam 0.5 MG tablet Commonly known as: Ativan 1-2 po if needed for anxiety up to every 8 hours What changed:   how much to take  how to take this  when to take this  reasons to take this  additional instructions   ondansetron 4 MG disintegrating tablet Commonly known as: ZOFRAN-ODT Take 4 mg by mouth every 8 (eight) hours as needed.   TURMERIC PO Take by mouth daily. Notes to patient: 01/13/2019   Vitamin D (Ergocalciferol) 1.25 MG (50000 UT) Caps capsule Commonly known as: DRISDOL TAKE 1 CAPSULE (50,000 UNITS TOTAL) BY MOUTH EVERY 7 (SEVEN) DAYS. Notes to patient: Continue home schedule

## 2019-01-13 NOTE — Progress Notes (Addendum)
STROKE TEAM PROGRESS NOTE   HISTORY OF PRESENT ILLNESS (per record) Donna Warren is an 68 y.o. female who presents to the ED on 12/22 for evaluation of transient episodes of right hand numbness and weakness accompanied by difficulty with reading comprehensionand expressive aphasia. Overall, she has had 3 of the episodes in the past week, each lasting for about 15 minutes. EEG findings are c/wl left temporal region dysfunction. She was started on an ASD.  INTERVAL HISTORY  I have personally reviewed history of presenting illness, electronic medical records and imaging films in PACS.  She had 2 episodes in November 2 weeks apart and thought episode last Saturday of transient speech difficulties with trouble reading and speaking as well as numbness in the right hand lasted about 10 to 15 minutes.  She had mild headache with only the last episode.  She has no prior history of migraine headaches.  She has no significant vascular risk factors.  We did get an EEG done today which shows focal left temporal slowing which may be a postictal finding.  There is no family history of seizures or past history of significant head injury or loss of consciousness.    OBJECTIVE Vitals:   01/12/19 2019 01/13/19 0007 01/13/19 0510 01/13/19 0812  BP: (!) 93/55 100/61 (!) 104/52 96/86  Pulse: 71 64 61 69  Resp: 17 17 18 18   Temp: 98 F (36.7 C) 97.6 F (36.4 C) 97.7 F (36.5 C) 98.4 F (36.9 C)  TempSrc: Oral Oral Oral Oral  SpO2: 97% 96% 100% 100%  Weight:      Height:        CBC:  Recent Labs  Lab 01/11/19 2309 01/13/19 1000  WBC 7.2 3.8*  NEUTROABS 4.0 1.9  HGB 13.0 12.3  HCT 36.7 34.4*  MCV 92.4 92.2  PLT 279 [id number]    Basic Metabolic Panel:  Recent Labs  Lab 01/11/19 2309 01/12/19 0342 01/13/19 1000  NA 140  --  139  K 3.2*  --  3.8  CL 99  --  102  CO2 31  --  26  GLUCOSE 116*  --  88  BUN 19  --  10  CREATININE 1.40*  --  1.06*  CALCIUM 10.6*  --  9.3  MG  --  2.0  --      Lipid Panel:     Component Value Date/Time   CHOL 262 (H) 01/13/2019 1000   TRIG 64 01/13/2019 1000   HDL 64 01/13/2019 1000   CHOLHDL 4.1 01/13/2019 1000   VLDL 13 01/13/2019 1000   LDLCALC 185 (H) 01/13/2019 1000   HgbA1c:  Lab Results  Component Value Date   HGBA1C 5.2 01/13/2019   Urine Drug Screen:     Component Value Date/Time   LABOPIA NONE DETECTED 01/12/2019 0748   COCAINSCRNUR NONE DETECTED 01/12/2019 0748   LABBENZ NONE DETECTED 01/12/2019 0748   AMPHETMU NONE DETECTED 01/12/2019 0748   THCU NONE DETECTED 01/12/2019 0748   LABBARB NONE DETECTED 01/12/2019 0748    Alcohol Level     Component Value Date/Time   ETH <10 01/12/2019 0342    IMAGING   CT ANGIO HEAD W OR WO CONTRAST  Result Date: 01/12/2019 CLINICAL DATA:  Stroke, follow-up. Fatigue. Patient feels jittery today. Brief episode of right hand numbness 3 days ago. EXAM: CT ANGIOGRAPHY HEAD AND NECK TECHNIQUE: Multidetector CT imaging of the head and neck was performed using the standard protocol during bolus administration of intravenous contrast. Multiplanar CT  image reconstructions and MIPs were obtained to evaluate the vascular anatomy. Carotid stenosis measurements (when applicable) are obtained utilizing NASCET criteria, using the distal internal carotid diameter as the denominator. CONTRAST:  20mL OMNIPAQUE IOHEXOL 350 MG/ML SOLN COMPARISON:  None. FINDINGS: CTA NECK FINDINGS Aortic arch: A three-vessel aortic arch is present. No significant atherosclerotic calcifications are present. There is no aneurysm or stenosis. No dissection is present. Right carotid system: The right common carotid artery is within normal limits. Bifurcation is unremarkable. Cervical right ICA is normal. Left carotid system: The left common carotid artery is within normal limits. Bifurcation is unremarkable. Cervical left ICA is normal. Vertebral arteries: The vertebral arteries are codominant. Both vertebral arteries originate  from the subclavian arteries without significant stenosis. There is no significant stenosis of either vertebral artery in the neck. Skeleton: Mild endplate degenerative changes are present in the cervical spine. Mild osseous foraminal narrowing is present on the left at C6-7. No focal lytic or blastic lesions are present. Other neck: The soft tissues the neck are otherwise unremarkable. Thyroid is within normal limits. Salivary glands are normal. No significant adenopathy is present. Upper chest: The lung apices are clear. Thoracic inlet is within normal limits. Review of the MIP images confirms the above findings CTA HEAD FINDINGS Anterior circulation: Atherosclerotic calcifications are present within the cavernous internal carotid arteries bilaterally. There is mild narrowing of the supraclinoid left ICA, less than 50% relative to the more distal vessel. The ICA termini are within normal limits bilaterally. The A1 and M1 segments are normal. The anterior communicating artery is patent. MCA bifurcations are intact. The ACA and MCA branch vessels are within normal limits. Posterior circulation: The vertebral arteries are codominant. PICA origins are visualized and normal bilaterally. Vertebrobasilar junction is normal. Basilar artery is normal. Both posterior cerebral arteries originate from the basilar tip. A prominent left posterior communicating artery contributes. The posterior cerebral arteries and branch vessels are within normal limits. Venous sinuses: The dural sinuses are patent. The straight sinus deep cerebral veins are intact. Cortical veins are unremarkable. Anatomic variants: None Review of the MIP images confirms the above findings IMPRESSION: 1. Normal variant CTA Circle of Willis without significant proximal stenosis, aneurysm, or branch vessel occlusion. 2. Mild atherosclerotic changes within the cavernous internal carotid arteries bilaterally without significant stenosis. Electronically Signed    By: San Morelle M.D.   On: 01/12/2019 09:25   DG Chest 2 View  Result Date: 01/12/2019 CLINICAL DATA:  TIA.S PAST FEW WEEKS WITH RIGHT ARM AFFECTED,SPEECH AFFECTED-OK NOW EXAM: CHEST - 2 VIEW COMPARISON:  09/16/2008 FINDINGS: Cardiac silhouette is normal in size and configuration. Normal mediastinal and hilar contours. Clear lungs.  No pleural effusion or pneumothorax. Skeletal structures are unremarkable. IMPRESSION: No active cardiopulmonary disease. Electronically Signed   By: Lajean Manes M.D.   On: 01/12/2019 08:45   CT Head Wo Contrast  Result Date: 01/12/2019 CLINICAL DATA:  Fatigue, jittery and brief right hand numbness 3 days ago. EXAM: CT HEAD WITHOUT CONTRAST TECHNIQUE: Contiguous axial images were obtained from the base of the skull through the vertex without intravenous contrast. COMPARISON:  None. FINDINGS: Brain: The ventricles are normal in size and configuration. No extra-axial fluid collections are identified. The gray-white differentiation is maintained. No CT findings for acute hemispheric infarction or intracranial hemorrhage. No mass lesions. The brainstem and cerebellum are normal. Vascular: Scattered vascular calcifications. No hyperdense vessels or obvious aneurysm. Skull: No acute skull fracture.  No bone lesion. Sinuses/Orbits: The paranasal sinuses and  mastoid air cells are clear. The globes are intact. Other: No scalp lesions, laceration or hematoma. IMPRESSION: Normal head CT. Electronically Signed   By: Marijo Sanes M.D.   On: 01/12/2019 05:54   CT ANGIO NECK W OR WO CONTRAST  Result Date: 01/12/2019 CLINICAL DATA:  Stroke, follow-up. Fatigue. Patient feels jittery today. Brief episode of right hand numbness 3 days ago. EXAM: CT ANGIOGRAPHY HEAD AND NECK TECHNIQUE: Multidetector CT imaging of the head and neck was performed using the standard protocol during bolus administration of intravenous contrast. Multiplanar CT image reconstructions and MIPs were  obtained to evaluate the vascular anatomy. Carotid stenosis measurements (when applicable) are obtained utilizing NASCET criteria, using the distal internal carotid diameter as the denominator. CONTRAST:  55mL OMNIPAQUE IOHEXOL 350 MG/ML SOLN COMPARISON:  None. FINDINGS: CTA NECK FINDINGS Aortic arch: A three-vessel aortic arch is present. No significant atherosclerotic calcifications are present. There is no aneurysm or stenosis. No dissection is present. Right carotid system: The right common carotid artery is within normal limits. Bifurcation is unremarkable. Cervical right ICA is normal. Left carotid system: The left common carotid artery is within normal limits. Bifurcation is unremarkable. Cervical left ICA is normal. Vertebral arteries: The vertebral arteries are codominant. Both vertebral arteries originate from the subclavian arteries without significant stenosis. There is no significant stenosis of either vertebral artery in the neck. Skeleton: Mild endplate degenerative changes are present in the cervical spine. Mild osseous foraminal narrowing is present on the left at C6-7. No focal lytic or blastic lesions are present. Other neck: The soft tissues the neck are otherwise unremarkable. Thyroid is within normal limits. Salivary glands are normal. No significant adenopathy is present. Upper chest: The lung apices are clear. Thoracic inlet is within normal limits. Review of the MIP images confirms the above findings CTA HEAD FINDINGS Anterior circulation: Atherosclerotic calcifications are present within the cavernous internal carotid arteries bilaterally. There is mild narrowing of the supraclinoid left ICA, less than 50% relative to the more distal vessel. The ICA termini are within normal limits bilaterally. The A1 and M1 segments are normal. The anterior communicating artery is patent. MCA bifurcations are intact. The ACA and MCA branch vessels are within normal limits. Posterior circulation: The  vertebral arteries are codominant. PICA origins are visualized and normal bilaterally. Vertebrobasilar junction is normal. Basilar artery is normal. Both posterior cerebral arteries originate from the basilar tip. A prominent left posterior communicating artery contributes. The posterior cerebral arteries and branch vessels are within normal limits. Venous sinuses: The dural sinuses are patent. The straight sinus deep cerebral veins are intact. Cortical veins are unremarkable. Anatomic variants: None Review of the MIP images confirms the above findings IMPRESSION: 1. Normal variant CTA Circle of Willis without significant proximal stenosis, aneurysm, or branch vessel occlusion. 2. Mild atherosclerotic changes within the cavernous internal carotid arteries bilaterally without significant stenosis. Electronically Signed   By: San Morelle M.D.   On: 01/12/2019 09:25   MR BRAIN WO CONTRAST  Result Date: 01/12/2019 CLINICAL DATA:  TIA. Multiple episodes of dysarthria in the past 3 months with the more recent episodes being associated with right hand paresthesias and weakness. EXAM: MRI HEAD WITHOUT CONTRAST TECHNIQUE: Multiplanar, multiecho pulse sequences of the brain and surrounding structures were obtained without intravenous contrast. COMPARISON:  Head CT 01/12/2019 FINDINGS: Brain: There is no evidence of acute infarct, intracranial hemorrhage, mass, midline shift, or extra-axial fluid collection. The ventricles and sulci are normal. A few punctate foci of T2 hyperintensity in  the cerebral white matter are within normal limits for age. Vascular: Major intracranial vascular flow voids are preserved. Skull and upper cervical spine: Unremarkable bone marrow signal. Sinuses/Orbits: Unremarkable orbits. Paranasal sinuses and mastoid air cells are clear. Other: None. IMPRESSION: Negative brain MRI. Electronically Signed   By: Logan Bores M.D.   On: 01/12/2019 12:52   EEG adult  Result Date:  01/13/2019 Lora Havens, MD     01/13/2019 12:52 PM Patient Name: Donna Warren MRN: [id number] Epilepsy Attending: Lora Havens Referring Physician/Provider: Dr. Antony Contras Date: 01/13/2019 Duration: 25.11 minutes Patient history: 68 year old female with multiple episodes of transient right hand numbness and weakness accompanied by difficulty in breathing, comprehension and expressive aphasia.  EEG to evaluate for seizures. Level of alertness: Awake, drowsy AEDs during EEG study: None Technical aspects: This EEG study was done with scalp electrodes positioned according to the 10-20 International system of electrode placement. Electrical activity was acquired at a sampling rate of 500Hz  and reviewed with a high frequency filter of 70Hz  and a low frequency filter of 1Hz . EEG data were recorded continuously and digitally stored. Description: The posterior dominant rhythm consists of 8.5 Hz activity of moderate voltage (25-35 uV) seen predominantly in posterior head regions, symmetric and reactive to eye opening and eye Drowsiness was characterized by attenuation of the posterior background rhythm and roving eye movements.  Intermittent 2 to 3 Hz delta slowing was seen in left temporal region.  Sharp transients were also seen in left frontotemporal region.  Hyperventilation and photic stimulation were not performed. Abnormality -Intermittent slow, left temporal IMPRESSION: This study is suggestive of non specific cortical dysfunction in the left temporal region. No seizures or definite epileptiform discharges were seen throughout the recording. However, only wakefulness and drowsiness were recorded. If suspicion for interictal activity remains a concern, a prolonged study including sleep can be considered. Lora Havens   ECHOCARDIOGRAM COMPLETE  Result Date: 01/12/2019   ECHOCARDIOGRAM REPORT   Patient Name:   Donna Warren Date of Exam: 01/12/2019 Medical Rec #:  [id number]    Height:       64.0 in  Accession #:    [id number]   Weight:       122.0 lb Date of Birth:  1950/11/01    BSA:          1.59 m Patient Age:    63 years     BP:           101/65 mmHg Patient Gender: F            HR:           73 bpm. Exam Location:  Inpatient Procedure: 2D Echo Indications:    TIA 435.9 / G45.9  History:        Patient has no prior history of Echocardiogram examinations.                 Neurologic changes.  Sonographer:    Darlina Sicilian RDCS Referring Phys: [id number] [place]  1. Left ventricular ejection fraction, by visual estimation, is 55 to 60%. The left ventricle has normal function. There is no left ventricular hypertrophy.  2. The left ventricle has no regional wall motion abnormalities.  3. Global right ventricle has normal systolic function.The right ventricular size is normal. No increase in right ventricular wall thickness.  4. Left atrial size was normal.  5. Right atrial size was normal.  6. The mitral valve is normal in structure. No  evidence of mitral valve regurgitation.  7. The tricuspid valve is normal in structure. Tricuspid valve regurgitation is mild.  8. The aortic valve is normal in structure. Aortic valve regurgitation is not visualized.  9. The pulmonic valve was grossly normal. Pulmonic valve regurgitation is trivial. 10. Normal pulmonary artery systolic pressure. 11. The inferior vena cava is normal in size with greater than 50% respiratory variability, suggesting right atrial pressure of 3 mmHg. FINDINGS  Left Ventricle: Left ventricular ejection fraction, by visual estimation, is 55 to 60%. The left ventricle has normal function. The left ventricle has no regional wall motion abnormalities. The left ventricular internal cavity size was the left ventricle is normal in size. There is no left ventricular hypertrophy. Left ventricular diastolic parameters were normal. Normal left atrial pressure. Right Ventricle: The right ventricular size is normal. No increase in right  ventricular wall thickness. Global RV systolic function is has normal systolic function. The tricuspid regurgitant velocity is 2.27 m/s, and with an assumed right atrial pressure  of 3 mmHg, the estimated right ventricular systolic pressure is normal at 23.7 mmHg. Left Atrium: Left atrial size was normal in size. Right Atrium: Right atrial size was normal in size Pericardium: There is no evidence of pericardial effusion. Mitral Valve: The mitral valve is normal in structure. No evidence of mitral valve regurgitation. Tricuspid Valve: The tricuspid valve is normal in structure. Tricuspid valve regurgitation is mild. Aortic Valve: The aortic valve is normal in structure. Aortic valve regurgitation is not visualized. Pulmonic Valve: The pulmonic valve was grossly normal. Pulmonic valve regurgitation is trivial. Pulmonic regurgitation is trivial. Aorta: The aortic root, ascending aorta and aortic arch are all structurally normal, with no evidence of dilitation or obstruction. Venous: The inferior vena cava is normal in size with greater than 50% respiratory variability, suggesting right atrial pressure of 3 mmHg. IAS/Shunts: No atrial level shunt detected by color flow Doppler.  LEFT VENTRICLE PLAX 2D LVIDd:         4.13 cm  Diastology LVIDs:         2.33 cm  LV e' lateral:   8.05 cm/s LV PW:         0.76 cm  LV E/e' lateral: 6.1 LV IVS:        1.08 cm  LV e' medial:    5.44 cm/s LVOT diam:     1.60 cm  LV E/e' medial:  9.1 LV SV:         57 ml LV SV Index:   35.93 LVOT Area:     2.01 cm  RIGHT VENTRICLE RV S prime:     12.30 cm/s TAPSE (M-mode): 1.9 cm LEFT ATRIUM             Index       RIGHT ATRIUM           Index LA diam:        2.60 cm 1.64 cm/m  RA Area:     10.70 cm LA Vol (A2C):   26.1 ml 16.46 ml/m RA Volume:   23.60 ml  14.88 ml/m LA Vol (A4C):   25.6 ml 16.14 ml/m LA Biplane Vol: 27.0 ml 17.03 ml/m  AORTIC VALVE LVOT Vmax:   95.30 cm/s LVOT Vmean:  61.600 cm/s LVOT VTI:    0.174 m  AORTA Ao Root diam:  2.80 cm MITRAL VALVE  TRICUSPID VALVE MV Area (PHT): 3.19 cm             TR Peak grad:   20.7 mmHg MV PHT:        69.02 msec           TR Vmax:        231.00 cm/s MV Decel Time: 238 msec MV E velocity: 49.30 cm/s 103 cm/s  SHUNTS MV A velocity: 45.40 cm/s 70.3 cm/s Systemic VTI:  0.17 m MV E/A ratio:  1.09       1.5       Systemic Diam: 1.60 cm  Mihai Croitoru MD Electronically signed by Sanda Klein MD Signature Date/Time: 01/12/2019/1:13:51 PM    Final    ECG - SR rate 59 BPM. (See cardiology reading for complete details)   EEG This study is suggestive of non specific cortical dysfunction in the left temporal region. No seizures or definite epileptiform discharges were seen throughout the recording. However, only wakefulness and drowsiness were recorded. If suspicion for interictal activity remains a concern, a prolonged study including sleep can be considered.   PHYSICAL EXAM Blood pressure 96/86, pulse 69, temperature 98.4 F (36.9 C), temperature source Oral, resp. rate 18, height 5\' 4"  (1.626 m), weight 55.3 kg, SpO2 100 %. Pleasant middle-aged frail Caucasian lady not in distress. . Afebrile. Head is nontraumatic. Neck is supple without bruit.    Cardiac exam no murmur or gallop. Lungs are clear to auscultation. Distal pulses are well felt. . Afebrile. Head is nontraumatic. Neck is supple without bruit.    Cardiac exam no murmur or gallop. Lungs are clear to auscultation. Distal pulses are well felt. Neurological Exam ;  Awake  Alert oriented x 3. Normal speech and language.eye movements full without nystagmus.fundi were not visualized. Vision acuity and fields appear normal. Hearing is normal. Palatal movements are normal. Face symmetric. Tongue midline. Normal strength, tone, reflexes and coordination. Normal sensation. Gait deferred.  HOME MEDICATIONS:  No medications prior to admission.      HOSPITAL MEDICATIONS:  .  stroke: mapping our early stages of  recovery book   Does not apply Once  . aspirin EC  81 mg Oral Daily  . chlorthalidone  25 mg Oral Daily  . citalopram  10 mg Oral Daily  . divalproex  500 mg Oral Daily  . enoxaparin (LOVENOX) injection  40 mg Subcutaneous Daily    ALLERGIES Allergies  Allergen Reactions  . Penicillins     Did it involve swelling of the face/tongue/throat, SOB, or low BP? No Did it involve sudden or severe rash/hives, skin peeling, or any reaction on the inside of your mouth or nose? No Did you need to seek medical attention at a hospital or doctor's office? No When did it last happen? If all above answers are "NO", may proceed with cephalosporin use.   ASSESSMENT/PLAN Ms. Donna Warren is a 68 y.o. female 68 y.o. female presenting with symptoms concerning for transient ischemic attacks vs seizures. CTH was normal. No stroke risk factors other than age. EEG findings are c/wl left temporal region dysfunction. She was started on Depakote 500mg  ER daily.    Recurrent episodes of transient speech difficulties and right hand paresthesias possibly focal seizures versus complicated migraine.  Doubt  Stroke or TIA  Code Stroke CT Head -    ASPECTS n/a  CT head - neg  MRI head- neg  MRA head - n/a  CTA H&N - normal, mild athero changes in cavernous  internal carotids bilat  CT Perfusion  Carotid Doppler - n/a  2D Echo - wnl  Sars Corona Virus 2  neg  LDL - 185    Component Value Date/Time   LDLCALC 185 (H) 01/13/2019 1000     HgbA1c - 5.2  UDS neg  VTE prophylaxis - lovenox Diet  Diet Order            Diet vegetarian Room service appropriate? Yes; Fluid consistency: Thin  Diet effective 1400               none prior to admission, now on ASA  Ongoing aggressive stroke risk factor management  Therapy recommendations: None  Disposition: Home  Hypertension  Home BP meds: chlorthalidone  Current BP meds: chlorthalidone  Stable . Long-term BP goal  normotensive  Hyperlipidemia  Home Lipid lowering medication: none   LDL 185  Current lipid lowering medication: No stroke, not indicated. Should have close PCP f/u since this is elevated  Continue statin at discharge  Diabetes- no dx of DM2  HgbA1c 5.2, goal < 7.0  No results for input(s): GLUCAP in the last 72 hours.  Other Stroke Risk Factors  none  Other Active Problems She was recently treated for an exacerbation of her Meniere's disease involving her right ear.   Hospital day # 0  Desiree Metzger-Cihelka, ARNP-C, ANVP-BC Pager: [phone] I have personally obtained history,examined this patient, reviewed notes, independently viewed imaging studies, participated in medical decision making and plan of care.ROS completed by me personally and pertinent positives fully documented  I have made any additions or clarifications directly to the above note. Agree with note above.  She has had recurrent stereotypical episodes of speech and language difficulties along with right hand paresthesias and EEG shows focal left temporal slowing raising strong suspicion for partial seizures.  Brain imaging and vascular studies are unyielding.  Recommend trial of Depakote ER 500 mg daily and will repeat EEG upon follow-up visit in the office.  Start aspirin 81 mg daily.  Long discussion with the patient as well as I spoke to her brother who is a ER physician in [place] over the phone and answered questions.  Discussed with Dr. Einar Grad.  Greater than 50% time during this 35-minute visit was spent on counseling and coordination of care about recurrent episodes of TIA versus seizures versus migraine discussion and answering questions  Antony Contras, MD Medical Director [facility] Pager: [phone] 01/13/2019 8:12 PM  To contact Stroke Continuity provider, please refer to [email or web address] After hours, contact General Neurology

## 2019-01-13 NOTE — Progress Notes (Signed)
SLP Cancellation Note  Patient Details Name: Donna Warren MRN: [id number] DOB: 08-Mar-1950   Cancelled treatment:       Reason Eval/Treat Not Completed: SLP screened, no needs identified, will sign off; symptoms resolved; pt without deficits per screening; will s/o for ST.   Elvina Sidle, M.S., CCC-SLP 01/13/2019, 10:26 AM

## 2019-01-13 NOTE — Progress Notes (Signed)
EEG completed, results pending. 

## 2019-01-13 NOTE — Procedures (Signed)
Patient Name: Donna Warren  MRN: [id number]  Epilepsy Attending: Lora Havens  Referring Physician/Provider: Dr. Antony Contras Date: 01/13/2019 Duration: 25.11 minutes  Patient history: 68 year old female with multiple episodes of transient right hand numbness and weakness accompanied by difficulty in breathing, comprehension and expressive aphasia.  EEG to evaluate for seizures.  Level of alertness: Awake, drowsy  AEDs during EEG study: None  Technical aspects: This EEG study was done with scalp electrodes positioned according to the 10-20 International system of electrode placement. Electrical activity was acquired at a sampling rate of 500Hz  and reviewed with a high frequency filter of 70Hz  and a low frequency filter of 1Hz . EEG data were recorded continuously and digitally stored.   Description: The posterior dominant rhythm consists of 8.5 Hz activity of moderate voltage (25-35 uV) seen predominantly in posterior head regions, symmetric and reactive to eye opening and eye Drowsiness was characterized by attenuation of the posterior background rhythm and roving eye movements.  Intermittent 2 to 3 Hz delta slowing was seen in left temporal region.  Sharp transients were also seen in left frontotemporal region.  Hyperventilation and photic stimulation were not performed.  Abnormality -Intermittent slow, left temporal   IMPRESSION: This study is suggestive of non specific cortical dysfunction in the left temporal region. No seizures or definite epileptiform discharges were seen throughout the recording. However, only wakefulness and drowsiness were recorded. If suspicion for interictal activity remains a concern, a prolonged study including sleep can be considered.   Noell Shular Barbra Sarks

## 2019-01-13 NOTE — Progress Notes (Signed)
Homocysteine level came back elevated at 21. Called and discussed with patient. I have sent recommendation to her PCP to have a B12 level drawn at her next appointment, and to consider supplementation with B12, folate and B6.   Electronically signed: Dr. Kerney Elbe

## 2019-01-13 NOTE — Discharge Summary (Signed)
Physician Discharge Summary  Donna Warren [id number] DOB: 1950-11-09 DOA: 01/11/2019  PCP: Burnis Medin, MD  Admit date: 01/11/2019 Discharge date: 01/13/2019  Admitted From: Home Disposition: Home  Recommendations for Outpatient Follow-up:  1. Follow up with PCP in 1-2 weeks 2. Follow-up with neurology (Dr. Leonie Man) in 6 weeks 3. Please obtain BMP/CBC in one week 4. Please follow up on the following pending results:  Home Health: None Equipment/Devices: None  Discharge Condition: Stable CODE STATUS: Full code Diet recommendation: Cardiac  Subjective: Seen and examined.  No more complaints.  Numbness resolved this morning.  HPI: Donna Warren is a 68 y.o. female with medical history significant of Gilbert syndrome, Mnire's disease, osteoporosis, and arthritis.  Symptoms may have first occurred in the middle of November, but thereafter has had 2 other episodes last occurring 3 days ago.  She reports having right hand numbness that lasted approximately 15 minutes and then self resolves.  She had tried shaking her hand without relief.  Associated symptoms include having lightheadedness/dizziness, mild frontal headaches, difficulty understanding what she was reading, and inability to pronounce certain words.  She reports occasionally getting headaches usually go away after her morning coffee and were different than frontal headaches.  Last night however she felt jittery and thought her hand was going numb again and came into the emergency department for further evaluation.  Her brother who is an ER doctor recommended patient to get a hypercoagulable work-up as well as Lyme titers.  She has a known history of Mnire's disease and had several episodes in October which was causing her to have hearing loss.  She ultimately had to be placed on prednisone and her chlorthalidone was increased from 25 mg to 50 mg.  She reports an improvement of her symptoms this month and she has regained some  of her hearing.  ED Course: Upon admission into the emergency department patient was noted to have heart rates 43-71, and all other vital signs within normal limits.  Labs significant for potassium 3.2, BUN 19, creatinine 1.4, and calcium 10.6.  Patient was given potassium chloride 40 mEq p.o., potassium chloride 10 mEq IV, and 2 g of magnesium sulfate.  Brief/Interim Summary: During her brief hospitalization, her hypokalemia was treated with repletion.  She underwent CT of the head followed by MRI of the brain which were negative for acute stroke.  Neurology was consulted.  EEG showed non specific cortical dysfunction in the left temporal region.  Per my discussion with neurologist Dr. Leonie Man, this is concerning for possible focal seizure based on the history and EEG findings and he has a started the patient on Depakote ER 500 mg p.o. daily and has recommended to discharge patient home.  He also told me that he has called and discussed the plan of care with patient's brother who happens to be ED physician in [place].  Patient is being discharged in stable condition.  Discharge Diagnoses:  Principal Problem:   Paresthesias Active Problems:   Meniere's disease   AKI (acute kidney injury) [facility])   Essential hypertension   Hypercalcemia   Hypokalemia   Bradycardia    Discharge Instructions  Discharge Instructions    Discharge patient   Complete by: As directed    Discharge disposition: 01-Home or Self Care   Discharge patient date: 01/13/2019     Allergies as of 01/13/2019      Reactions   Penicillins    Did it involve swelling of the face/tongue/throat, SOB, or low BP? No Did  it involve sudden or severe rash/hives, skin peeling, or any reaction on the inside of your mouth or nose? No Did you need to seek medical attention at a hospital or doctor's office? No When did it last happen? If all above answers are "NO", may proceed with cephalosporin use.      Medication List     TAKE these medications   Calcium 600/Vitamin D3 600-800 MG-UNIT Tabs Generic drug: Calcium Carb-Cholecalciferol Take 1 tablet by mouth daily.   chlorthalidone 25 MG tablet Commonly known as: HYGROTON TAKE 1 TABLET BY MOUTH EVERY DAY What changed: how much to take   cholecalciferol 25 MCG (1000 UT) tablet Commonly known as: VITAMIN D3 Take 1,000 Units by mouth daily.   citalopram 10 MG tablet Commonly known as: CELEXA Take 1 tablet (10 mg total) by mouth daily.   diazepam 2 MG tablet Commonly known as: VALIUM Take 2 mg by mouth as needed (for Meniere's flare up).   diclofenac sodium 1 % Gel Commonly known as: VOLTAREN Apply 3 g to 3 large joints up to 3 times a day.   divalproex 500 MG 24 hr tablet Commonly known as: DEPAKOTE ER Take 1 tablet (500 mg total) by mouth daily.   LORazepam 0.5 MG tablet Commonly known as: Ativan 1-2 po if needed for anxiety up to every 8 hours What changed:   how much to take  how to take this  when to take this  reasons to take this  additional instructions   ondansetron 4 MG disintegrating tablet Commonly known as: ZOFRAN-ODT Take 4 mg by mouth every 8 (eight) hours as needed.   TURMERIC PO Take by mouth daily.   Vitamin D (Ergocalciferol) 1.25 MG (50000 UT) Caps capsule Commonly known as: DRISDOL TAKE 1 CAPSULE (50,000 UNITS TOTAL) BY MOUTH EVERY 7 (SEVEN) DAYS.      Follow-up Information    Panosh, Standley Brooking, MD Follow up in 1 week(s).   Specialties: Internal Medicine, Pediatrics Contact information: [place] [address] [phone]        Garvin Fila, MD Follow up in 6 week(s).   Specialties: Neurology, Radiology Contact information: [address] [place] [address] [phone]          Allergies  Allergen Reactions  . Penicillins     Did it involve swelling of the face/tongue/throat, SOB, or low BP? No Did it involve sudden or severe rash/hives, skin  peeling, or any reaction on the inside of your mouth or nose? No Did you need to seek medical attention at a hospital or doctor's office? No When did it last happen? If all above answers are "NO", may proceed with cephalosporin use.    Consultations: Neurology   Procedures/Studies: CT ANGIO HEAD W OR WO CONTRAST  Result Date: 01/12/2019 CLINICAL DATA:  Stroke, follow-up. Fatigue. Patient feels jittery today. Brief episode of right hand numbness 3 days ago. EXAM: CT ANGIOGRAPHY HEAD AND NECK TECHNIQUE: Multidetector CT imaging of the head and neck was performed using the standard protocol during bolus administration of intravenous contrast. Multiplanar CT image reconstructions and MIPs were obtained to evaluate the vascular anatomy. Carotid stenosis measurements (when applicable) are obtained utilizing NASCET criteria, using the distal internal carotid diameter as the denominator. CONTRAST:  18mL OMNIPAQUE IOHEXOL 350 MG/ML SOLN COMPARISON:  None. FINDINGS: CTA NECK FINDINGS Aortic arch: A three-vessel aortic arch is present. No significant atherosclerotic calcifications are present. There is no aneurysm or stenosis. No dissection is  present. Right carotid system: The right common carotid artery is within normal limits. Bifurcation is unremarkable. Cervical right ICA is normal. Left carotid system: The left common carotid artery is within normal limits. Bifurcation is unremarkable. Cervical left ICA is normal. Vertebral arteries: The vertebral arteries are codominant. Both vertebral arteries originate from the subclavian arteries without significant stenosis. There is no significant stenosis of either vertebral artery in the neck. Skeleton: Mild endplate degenerative changes are present in the cervical spine. Mild osseous foraminal narrowing is present on the left at C6-7. No focal lytic or blastic lesions are present. Other neck: The soft tissues the neck are otherwise unremarkable. Thyroid is  within normal limits. Salivary glands are normal. No significant adenopathy is present. Upper chest: The lung apices are clear. Thoracic inlet is within normal limits. Review of the MIP images confirms the above findings CTA HEAD FINDINGS Anterior circulation: Atherosclerotic calcifications are present within the cavernous internal carotid arteries bilaterally. There is mild narrowing of the supraclinoid left ICA, less than 50% relative to the more distal vessel. The ICA termini are within normal limits bilaterally. The A1 and M1 segments are normal. The anterior communicating artery is patent. MCA bifurcations are intact. The ACA and MCA branch vessels are within normal limits. Posterior circulation: The vertebral arteries are codominant. PICA origins are visualized and normal bilaterally. Vertebrobasilar junction is normal. Basilar artery is normal. Both posterior cerebral arteries originate from the basilar tip. A prominent left posterior communicating artery contributes. The posterior cerebral arteries and branch vessels are within normal limits. Venous sinuses: The dural sinuses are patent. The straight sinus deep cerebral veins are intact. Cortical veins are unremarkable. Anatomic variants: None Review of the MIP images confirms the above findings IMPRESSION: 1. Normal variant CTA Circle of Willis without significant proximal stenosis, aneurysm, or branch vessel occlusion. 2. Mild atherosclerotic changes within the cavernous internal carotid arteries bilaterally without significant stenosis. Electronically Signed   By: San Morelle M.D.   On: 01/12/2019 09:25   DG Chest 2 View  Result Date: 01/12/2019 CLINICAL DATA:  TIA.S PAST FEW WEEKS WITH RIGHT ARM AFFECTED,SPEECH AFFECTED-OK NOW EXAM: CHEST - 2 VIEW COMPARISON:  09/16/2008 FINDINGS: Cardiac silhouette is normal in size and configuration. Normal mediastinal and hilar contours. Clear lungs.  No pleural effusion or pneumothorax. Skeletal  structures are unremarkable. IMPRESSION: No active cardiopulmonary disease. Electronically Signed   By: Lajean Manes M.D.   On: 01/12/2019 08:45   CT Head Wo Contrast  Result Date: 01/12/2019 CLINICAL DATA:  Fatigue, jittery and brief right hand numbness 3 days ago. EXAM: CT HEAD WITHOUT CONTRAST TECHNIQUE: Contiguous axial images were obtained from the base of the skull through the vertex without intravenous contrast. COMPARISON:  None. FINDINGS: Brain: The ventricles are normal in size and configuration. No extra-axial fluid collections are identified. The gray-white differentiation is maintained. No CT findings for acute hemispheric infarction or intracranial hemorrhage. No mass lesions. The brainstem and cerebellum are normal. Vascular: Scattered vascular calcifications. No hyperdense vessels or obvious aneurysm. Skull: No acute skull fracture.  No bone lesion. Sinuses/Orbits: The paranasal sinuses and mastoid air cells are clear. The globes are intact. Other: No scalp lesions, laceration or hematoma. IMPRESSION: Normal head CT. Electronically Signed   By: Marijo Sanes M.D.   On: 01/12/2019 05:54   CT ANGIO NECK W OR WO CONTRAST  Result Date: 01/12/2019 CLINICAL DATA:  Stroke, follow-up. Fatigue. Patient feels jittery today. Brief episode of right hand numbness 3 days ago. EXAM: CT  ANGIOGRAPHY HEAD AND NECK TECHNIQUE: Multidetector CT imaging of the head and neck was performed using the standard protocol during bolus administration of intravenous contrast. Multiplanar CT image reconstructions and MIPs were obtained to evaluate the vascular anatomy. Carotid stenosis measurements (when applicable) are obtained utilizing NASCET criteria, using the distal internal carotid diameter as the denominator. CONTRAST:  13mL OMNIPAQUE IOHEXOL 350 MG/ML SOLN COMPARISON:  None. FINDINGS: CTA NECK FINDINGS Aortic arch: A three-vessel aortic arch is present. No significant atherosclerotic calcifications are present.  There is no aneurysm or stenosis. No dissection is present. Right carotid system: The right common carotid artery is within normal limits. Bifurcation is unremarkable. Cervical right ICA is normal. Left carotid system: The left common carotid artery is within normal limits. Bifurcation is unremarkable. Cervical left ICA is normal. Vertebral arteries: The vertebral arteries are codominant. Both vertebral arteries originate from the subclavian arteries without significant stenosis. There is no significant stenosis of either vertebral artery in the neck. Skeleton: Mild endplate degenerative changes are present in the cervical spine. Mild osseous foraminal narrowing is present on the left at C6-7. No focal lytic or blastic lesions are present. Other neck: The soft tissues the neck are otherwise unremarkable. Thyroid is within normal limits. Salivary glands are normal. No significant adenopathy is present. Upper chest: The lung apices are clear. Thoracic inlet is within normal limits. Review of the MIP images confirms the above findings CTA HEAD FINDINGS Anterior circulation: Atherosclerotic calcifications are present within the cavernous internal carotid arteries bilaterally. There is mild narrowing of the supraclinoid left ICA, less than 50% relative to the more distal vessel. The ICA termini are within normal limits bilaterally. The A1 and M1 segments are normal. The anterior communicating artery is patent. MCA bifurcations are intact. The ACA and MCA branch vessels are within normal limits. Posterior circulation: The vertebral arteries are codominant. PICA origins are visualized and normal bilaterally. Vertebrobasilar junction is normal. Basilar artery is normal. Both posterior cerebral arteries originate from the basilar tip. A prominent left posterior communicating artery contributes. The posterior cerebral arteries and branch vessels are within normal limits. Venous sinuses: The dural sinuses are patent. The  straight sinus deep cerebral veins are intact. Cortical veins are unremarkable. Anatomic variants: None Review of the MIP images confirms the above findings IMPRESSION: 1. Normal variant CTA Circle of Willis without significant proximal stenosis, aneurysm, or branch vessel occlusion. 2. Mild atherosclerotic changes within the cavernous internal carotid arteries bilaterally without significant stenosis. Electronically Signed   By: San Morelle M.D.   On: 01/12/2019 09:25   MR BRAIN WO CONTRAST  Result Date: 01/12/2019 CLINICAL DATA:  TIA. Multiple episodes of dysarthria in the past 3 months with the more recent episodes being associated with right hand paresthesias and weakness. EXAM: MRI HEAD WITHOUT CONTRAST TECHNIQUE: Multiplanar, multiecho pulse sequences of the brain and surrounding structures were obtained without intravenous contrast. COMPARISON:  Head CT 01/12/2019 FINDINGS: Brain: There is no evidence of acute infarct, intracranial hemorrhage, mass, midline shift, or extra-axial fluid collection. The ventricles and sulci are normal. A few punctate foci of T2 hyperintensity in the cerebral white matter are within normal limits for age. Vascular: Major intracranial vascular flow voids are preserved. Skull and upper cervical spine: Unremarkable bone marrow signal. Sinuses/Orbits: Unremarkable orbits. Paranasal sinuses and mastoid air cells are clear. Other: None. IMPRESSION: Negative brain MRI. Electronically Signed   By: Logan Bores M.D.   On: 01/12/2019 12:52   EEG adult  Result Date: 01/13/2019 Zeb Comfort  Jenetta Downer, MD     01/13/2019 12:52 PM Patient Name: Donna Warren MRN: [id number] Epilepsy Attending: Lora Havens Referring Physician/Provider: Dr. Antony Contras Date: 01/13/2019 Duration: 25.11 minutes Patient history: 68 year old female with multiple episodes of transient right hand numbness and weakness accompanied by difficulty in breathing, comprehension and expressive aphasia.  EEG  to evaluate for seizures. Level of alertness: Awake, drowsy AEDs during EEG study: None Technical aspects: This EEG study was done with scalp electrodes positioned according to the 10-20 International system of electrode placement. Electrical activity was acquired at a sampling rate of 500Hz  and reviewed with a high frequency filter of 70Hz  and a low frequency filter of 1Hz . EEG data were recorded continuously and digitally stored. Description: The posterior dominant rhythm consists of 8.5 Hz activity of moderate voltage (25-35 uV) seen predominantly in posterior head regions, symmetric and reactive to eye opening and eye Drowsiness was characterized by attenuation of the posterior background rhythm and roving eye movements.  Intermittent 2 to 3 Hz delta slowing was seen in left temporal region.  Sharp transients were also seen in left frontotemporal region.  Hyperventilation and photic stimulation were not performed. Abnormality -Intermittent slow, left temporal IMPRESSION: This study is suggestive of non specific cortical dysfunction in the left temporal region. No seizures or definite epileptiform discharges were seen throughout the recording. However, only wakefulness and drowsiness were recorded. If suspicion for interictal activity remains a concern, a prolonged study including sleep can be considered. Lora Havens   ECHOCARDIOGRAM COMPLETE  Result Date: 01/12/2019   ECHOCARDIOGRAM REPORT   Patient Name:   Donna Warren Date of Exam: 01/12/2019 Medical Rec #:  [id number]    Height:       64.0 in Accession #:    [id number]   Weight:       122.0 lb Date of Birth:  05/16/1950    BSA:          1.59 m Patient Age:    5 years     BP:           101/65 mmHg Patient Gender: F            HR:           73 bpm. Exam Location:  Inpatient Procedure: 2D Echo Indications:    TIA 435.9 / G45.9  History:        Patient has no prior history of Echocardiogram examinations.                 Neurologic changes.  Sonographer:     Darlina Sicilian RDCS Referring Phys: [id number] [place]  1. Left ventricular ejection fraction, by visual estimation, is 55 to 60%. The left ventricle has normal function. There is no left ventricular hypertrophy.  2. The left ventricle has no regional wall motion abnormalities.  3. Global right ventricle has normal systolic function.The right ventricular size is normal. No increase in right ventricular wall thickness.  4. Left atrial size was normal.  5. Right atrial size was normal.  6. The mitral valve is normal in structure. No evidence of mitral valve regurgitation.  7. The tricuspid valve is normal in structure. Tricuspid valve regurgitation is mild.  8. The aortic valve is normal in structure. Aortic valve regurgitation is not visualized.  9. The pulmonic valve was grossly normal. Pulmonic valve regurgitation is trivial. 10. Normal pulmonary artery systolic pressure. 11. The inferior vena cava is normal in size with greater than 50% respiratory variability,  suggesting right atrial pressure of 3 mmHg. FINDINGS  Left Ventricle: Left ventricular ejection fraction, by visual estimation, is 55 to 60%. The left ventricle has normal function. The left ventricle has no regional wall motion abnormalities. The left ventricular internal cavity size was the left ventricle is normal in size. There is no left ventricular hypertrophy. Left ventricular diastolic parameters were normal. Normal left atrial pressure. Right Ventricle: The right ventricular size is normal. No increase in right ventricular wall thickness. Global RV systolic function is has normal systolic function. The tricuspid regurgitant velocity is 2.27 m/s, and with an assumed right atrial pressure  of 3 mmHg, the estimated right ventricular systolic pressure is normal at 23.7 mmHg. Left Atrium: Left atrial size was normal in size. Right Atrium: Right atrial size was normal in size Pericardium: There is no evidence of pericardial effusion.  Mitral Valve: The mitral valve is normal in structure. No evidence of mitral valve regurgitation. Tricuspid Valve: The tricuspid valve is normal in structure. Tricuspid valve regurgitation is mild. Aortic Valve: The aortic valve is normal in structure. Aortic valve regurgitation is not visualized. Pulmonic Valve: The pulmonic valve was grossly normal. Pulmonic valve regurgitation is trivial. Pulmonic regurgitation is trivial. Aorta: The aortic root, ascending aorta and aortic arch are all structurally normal, with no evidence of dilitation or obstruction. Venous: The inferior vena cava is normal in size with greater than 50% respiratory variability, suggesting right atrial pressure of 3 mmHg. IAS/Shunts: No atrial level shunt detected by color flow Doppler.  LEFT VENTRICLE PLAX 2D LVIDd:         4.13 cm  Diastology LVIDs:         2.33 cm  LV e' lateral:   8.05 cm/s LV PW:         0.76 cm  LV E/e' lateral: 6.1 LV IVS:        1.08 cm  LV e' medial:    5.44 cm/s LVOT diam:     1.60 cm  LV E/e' medial:  9.1 LV SV:         57 ml LV SV Index:   35.93 LVOT Area:     2.01 cm  RIGHT VENTRICLE RV S prime:     12.30 cm/s TAPSE (M-mode): 1.9 cm LEFT ATRIUM             Index       RIGHT ATRIUM           Index LA diam:        2.60 cm 1.64 cm/m  RA Area:     10.70 cm LA Vol (A2C):   26.1 ml 16.46 ml/m RA Volume:   23.60 ml  14.88 ml/m LA Vol (A4C):   25.6 ml 16.14 ml/m LA Biplane Vol: 27.0 ml 17.03 ml/m  AORTIC VALVE LVOT Vmax:   95.30 cm/s LVOT Vmean:  61.600 cm/s LVOT VTI:    0.174 m  AORTA Ao Root diam: 2.80 cm MITRAL VALVE                        TRICUSPID VALVE MV Area (PHT): 3.19 cm             TR Peak grad:   20.7 mmHg MV PHT:        69.02 msec           TR Vmax:        231.00 cm/s MV Decel Time: 238 msec MV E velocity: 49.30  cm/s 103 cm/s  SHUNTS MV A velocity: 45.40 cm/s 70.3 cm/s Systemic VTI:  0.17 m MV E/A ratio:  1.09       1.5       Systemic Diam: 1.60 cm  Mihai Croitoru MD Electronically signed by Sanda Klein MD Signature Date/Time: 01/12/2019/1:13:51 PM    Final       Discharge Exam: Vitals:   01/13/19 0510 01/13/19 0812  BP: (!) 104/52 96/86  Pulse: 61 69  Resp: 18 18  Temp: 97.7 F (36.5 C) 98.4 F (36.9 C)  SpO2: 100% 100%   Vitals:   01/12/19 2019 01/13/19 0007 01/13/19 0510 01/13/19 0812  BP: (!) 93/55 100/61 (!) 104/52 96/86  Pulse: 71 64 61 69  Resp: 17 17 18 18   Temp: 98 F (36.7 C) 97.6 F (36.4 C) 97.7 F (36.5 C) 98.4 F (36.9 C)  TempSrc: Oral Oral Oral Oral  SpO2: 97% 96% 100% 100%  Weight:      Height:        General: Pt is alert, awake, not in acute distress Cardiovascular: RRR, S1/S2 +, no rubs, no gallops Respiratory: CTA bilaterally, no wheezing, no rhonchi Abdominal: Soft, NT, ND, bowel sounds + Extremities: no edema, no cyanosis    The results of significant diagnostics from this hospitalization (including imaging, microbiology, ancillary and laboratory) are listed below for reference.     Microbiology: Recent Results (from the past 240 hour(s))  SARS CORONAVIRUS 2 (TAT 6-24 HRS) Nasopharyngeal Nasopharyngeal Swab     Status: None   Collection Time: 01/12/19  8:11 AM   Specimen: Nasopharyngeal Swab  Result Value Ref Range Status   SARS Coronavirus 2 NEGATIVE NEGATIVE Final    Comment: (NOTE) SARS-CoV-2 target nucleic acids are NOT DETECTED. The SARS-CoV-2 RNA is generally detectable in upper and lower respiratory specimens during the acute phase of infection. Negative results do not preclude SARS-CoV-2 infection, do not rule out co-infections with other pathogens, and should not be used as the sole basis for treatment or other patient management decisions. Negative results must be combined with clinical observations, patient history, and epidemiological information. The expected result is Negative. Fact Sheet for Patients: SugarRoll.be Fact Sheet for Healthcare  Providers: https://www.woods-mathews.com/ This test is not yet approved or cleared by the [place] FDA and  has been authorized for detection and/or diagnosis of SARS-CoV-2 by FDA under an Emergency Use Authorization (EUA). This EUA will remain  in effect (meaning this test can be used) for the duration of the COVID-19 declaration under Section 56 4(b)(1) of the Act, 21 U.S.C. section 360bbb-3(b)(1), unless the authorization is terminated or revoked sooner. Performed at [facility], [place] [address] [place], [place] [address]      Labs: BNP (last 3 results) No results for input(s): BNP in the last 8760 hours. Basic Metabolic Panel: Recent Labs  Lab 01/11/19 2309 01/12/19 0342 01/13/19 1000  NA 140  --  139  K 3.2*  --  3.8  CL 99  --  102  CO2 31  --  26  GLUCOSE 116*  --  88  BUN 19  --  10  CREATININE 1.40*  --  1.06*  CALCIUM 10.6*  --  9.3  MG  --  2.0  --    Liver Function Tests: Recent Labs  Lab 01/11/19 2309  AST 32  ALT 46*  ALKPHOS 98  BILITOT 1.5*  PROT 7.3  ALBUMIN 4.3   No results for input(s): LIPASE,  AMYLASE in the last 168 hours. No results for input(s): AMMONIA in the last 168 hours. CBC: Recent Labs  Lab 01/11/19 2309 01/13/19 1000  WBC 7.2 3.8*  NEUTROABS 4.0 1.9  HGB 13.0 12.3  HCT 36.7 34.4*  MCV 92.4 92.2  PLT 279 234   Cardiac Enzymes: No results for input(s): CKTOTAL, CKMB, CKMBINDEX, TROPONINI in the last 168 hours. BNP: Invalid input(s): POCBNP CBG: No results for input(s): GLUCAP in the last 168 hours. D-Dimer No results for input(s): DDIMER in the last 72 hours. Hgb A1c Recent Labs    01/13/19 1000  HGBA1C 5.2   Lipid Profile Recent Labs    01/13/19 1000  CHOL 262*  HDL 64  LDLCALC 185*  TRIG 64  CHOLHDL 4.1   Thyroid function studies No results for input(s): TSH, T4TOTAL, T3FREE, THYROIDAB in the last 72 hours.  Invalid input(s): FREET3 Anemia work up No results for input(s):  VITAMINB12, FOLATE, FERRITIN, TIBC, IRON, RETICCTPCT in the last 72 hours. Urinalysis    Component Value Date/Time   COLORURINE YELLOW 01/11/2019 2202   APPEARANCEUR CLEAR 01/11/2019 2202   LABSPEC 1.008 01/11/2019 2202   PHURINE 8.0 01/11/2019 2202   GLUCOSEU NEGATIVE 01/11/2019 2202   GLUCOSEU NEGATIVE 03/12/2011 1536   HGBUR SMALL (A) 01/11/2019 2202   HGBUR trace-lysed 07/05/2009 1144   BILIRUBINUR NEGATIVE 01/11/2019 2202   BILIRUBINUR n 02/28/2011 1652   KETONESUR NEGATIVE 01/11/2019 2202   PROTEINUR NEGATIVE 01/11/2019 2202   UROBILINOGEN 0.2 03/12/2011 1536   NITRITE NEGATIVE 01/11/2019 2202   LEUKOCYTESUR NEGATIVE 01/11/2019 2202   Sepsis Labs Invalid input(s): PROCALCITONIN,  WBC,  LACTICIDVEN Microbiology Recent Results (from the past 240 hour(s))  SARS CORONAVIRUS 2 (TAT 6-24 HRS) Nasopharyngeal Nasopharyngeal Swab     Status: None   Collection Time: 01/12/19  8:11 AM   Specimen: Nasopharyngeal Swab  Result Value Ref Range Status   SARS Coronavirus 2 NEGATIVE NEGATIVE Final    Comment: (NOTE) SARS-CoV-2 target nucleic acids are NOT DETECTED. The SARS-CoV-2 RNA is generally detectable in upper and lower respiratory specimens during the acute phase of infection. Negative results do not preclude SARS-CoV-2 infection, do not rule out co-infections with other pathogens, and should not be used as the sole basis for treatment or other patient management decisions. Negative results must be combined with clinical observations, patient history, and epidemiological information. The expected result is Negative. Fact Sheet for Patients: SugarRoll.be Fact Sheet for Healthcare Providers: https://www.woods-mathews.com/ This test is not yet approved or cleared by the [place] FDA and  has been authorized for detection and/or diagnosis of SARS-CoV-2 by FDA under an Emergency Use Authorization (EUA). This EUA will remain  in effect  (meaning this test can be used) for the duration of the COVID-19 declaration under Section 56 4(b)(1) of the Act, 21 U.S.C. section 360bbb-3(b)(1), unless the authorization is terminated or revoked sooner. Performed at [facility], [place] [address] [place], [place] [address]      Time coordinating discharge: Over 30 minutes  SIGNED:   Darliss Cheney, MD  [facility] 01/13/2019, 1:16 PM  If 7PM-7AM, please contact night-coverage [email or web address] Password [email or web address]

## 2019-01-13 NOTE — Plan of Care (Signed)
  Problem: Activity: Goal: Risk for activity intolerance will decrease Outcome: Progressing   

## 2019-01-13 NOTE — Telephone Encounter (Signed)
Copied from [place] [id number]. Topic: General - Other >> Jan 13, 2019  2:50 PM Celene Kras wrote: Reason for CRM: Pt called stating she is needing to set up a hosp follow up. PCP does not have anything in office until January. Please advise.

## 2019-01-13 NOTE — Progress Notes (Signed)
OT Cancellation Note  Patient Details Name: Donna Warren MRN: [id number] DOB: 10/15/1950   Cancelled Treatment:    Reason Eval/Treat Not Completed: OT screened, no needs identified, will sign off. Pt reports symptoms have resolved.   Tyrone Schimke, OT Acute Rehabilitation Services Pager: [phone] Office: [phone]  01/13/2019, 10:44 AM

## 2019-01-13 NOTE — Telephone Encounter (Signed)
Pt has been made app virtually

## 2019-01-14 LAB — PROTEIN C, TOTAL: Protein C, Total: 131 % (ref 60–150)

## 2019-01-14 NOTE — Progress Notes (Signed)
Was reviewing patient's chart today.  She was discharged 2 days ago.  As per my discharge summary, I received a phone call from Dr. Leonie Man about discharging patient on Depakote ER.  I was not told that patient also needs to be discharged on aspirin 81 mg however Dr. Leonie Man has documented that in his progress note which was documented after my discharge was placed.  I called patient today.  She told me that she was informed by Dr. Leonie Man to take aspirin 81 mg and she has been taking them.

## 2019-01-18 DIAGNOSIS — R7989 Other specified abnormal findings of blood chemistry: Secondary | ICD-10-CM

## 2019-01-18 DIAGNOSIS — R200 Anesthesia of skin: Secondary | ICD-10-CM

## 2019-01-18 DIAGNOSIS — G459 Transient cerebral ischemic attack, unspecified: Secondary | ICD-10-CM

## 2019-01-18 DIAGNOSIS — Z79899 Other long term (current) drug therapy: Secondary | ICD-10-CM

## 2019-01-18 LAB — FACTOR 5 LEIDEN

## 2019-01-18 NOTE — Telephone Encounter (Signed)
Yes ok to  Arrange lab tests I have placed  Future orders in the record  .pleas have her make lab appt

## 2019-01-19 ENCOUNTER — Other Ambulatory Visit (INDEPENDENT_AMBULATORY_CARE_PROVIDER_SITE_OTHER): Payer: PPO

## 2019-01-19 ENCOUNTER — Other Ambulatory Visit: Payer: Self-pay

## 2019-01-19 DIAGNOSIS — R7989 Other specified abnormal findings of blood chemistry: Secondary | ICD-10-CM

## 2019-01-19 DIAGNOSIS — G459 Transient cerebral ischemic attack, unspecified: Secondary | ICD-10-CM

## 2019-01-19 DIAGNOSIS — Z79899 Other long term (current) drug therapy: Secondary | ICD-10-CM | POA: Diagnosis not present

## 2019-01-19 DIAGNOSIS — R200 Anesthesia of skin: Secondary | ICD-10-CM | POA: Diagnosis not present

## 2019-01-19 LAB — PROTHROMBIN GENE MUTATION

## 2019-01-19 LAB — VITAMIN B12: Vitamin B-12: 477 pg/mL (ref 211–911)

## 2019-01-19 LAB — FOLATE: Folate: 9.9 ng/mL (ref >5.9)

## 2019-01-21 ENCOUNTER — Other Ambulatory Visit: Payer: Self-pay

## 2019-01-21 ENCOUNTER — Telehealth (INDEPENDENT_AMBULATORY_CARE_PROVIDER_SITE_OTHER): Payer: PPO | Admitting: Internal Medicine

## 2019-01-21 ENCOUNTER — Encounter: Payer: Self-pay | Admitting: Internal Medicine

## 2019-01-21 VITALS — Wt 119.0 lb

## 2019-01-21 DIAGNOSIS — Z79899 Other long term (current) drug therapy: Secondary | ICD-10-CM | POA: Diagnosis not present

## 2019-01-21 DIAGNOSIS — R7989 Other specified abnormal findings of blood chemistry: Secondary | ICD-10-CM | POA: Diagnosis not present

## 2019-01-21 DIAGNOSIS — G40909 Epilepsy, unspecified, not intractable, without status epilepticus: Secondary | ICD-10-CM

## 2019-01-21 DIAGNOSIS — Z09 Encounter for follow-up examination after completed treatment for conditions other than malignant neoplasm: Secondary | ICD-10-CM

## 2019-01-21 DIAGNOSIS — E7800 Pure hypercholesterolemia, unspecified: Secondary | ICD-10-CM

## 2019-01-21 DIAGNOSIS — H8109 Meniere's disease, unspecified ear: Secondary | ICD-10-CM | POA: Diagnosis not present

## 2019-01-21 DIAGNOSIS — D6852 Prothrombin gene mutation: Secondary | ICD-10-CM | POA: Diagnosis not present

## 2019-01-21 LAB — METHYLMALONIC ACID, SERUM: Methylmalonic Acid, Quant: 247 nmol/L (ref 87–318)

## 2019-01-21 LAB — HOMOCYSTEINE: Homocysteine: 20.9 umol/L — ABNORMAL HIGH (ref <10.4)

## 2019-01-21 NOTE — Progress Notes (Signed)
Virtual Visit via Video Note  I connected with@ on 01/21/19 at  8:30 AM EST by a video enabled telemedicine application and verified that I am speaking with the correct person using two identifiers. Location patient: home Location provider: home office Persons participating in the virtual visit: patient, provider  [place]th national recommendations  regarding COVID 19 pandemic   video visit is advised over in office visit for this patient.  Patient aware  of the limitations of evaluation and management by telemedicine and  availability of in person appointments. and agreed to proceed.   HPI: Donna Warren presents for video visit for post hospital for episodic alteration in cognition  Aphasic  hand and cognition that was felt to be from a seizure condition. vsd atypical migraine       12 21 - 12 23 2020 Originally thought to be TIA and vascular work-up was negative for acute obstruction hypercoagulable work-up was ordered at request of family member.  And B vitamins were advised because of elevated homocystine pending B12 levels. She was placed on Depakote extended release 500 mg has been out of the hospital for about a week and feeling much better had some slight nausea this morning.  No untoward side effects that she is aware of.  She has follow-up with neurology Dr. Marcheta Grammes beginning of February.to have a f/u eeg in office there    ROS: See pertinent positives and negatives per HPI.  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 Denies mental fogginess or acute neurologic symptoms.  Numbness or weakness.  Past Medical History:  Diagnosis Date  . 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 Arthritis   . Cataract    developing  . Femur fracture, right ([place]) 2003  . Fibroids   . Gilbert's syndrome   . History of liver biopsy nl    records of fatty liver 2008 not  confirmed   . Hx of adenomatous colonic polyps   . Meniere disease    Right ear  . Osteoporosis   . Osteoporosis   . Other hemochromatosis    heterozygosity for h63d gene  . Spontaneous abortion     Past Surgical History:  Procedure Laterality Date  . BREAST CYST EXCISION Right   . [place]  . COLONOSCOPY  220, 2004, 2007, 08/13/2010   2002: 8 mm polyp 2004: no polyps 2007: no polyps 2012: hemorrhoids  . hosp r/o mi cv right chest and jaw pain  2010  . LIVER BIOPSY     2015 negative   no fatty liver  records  2008 said fatty liver    . OVARIAN CYST REMOVAL    . POLYPECTOMY    . spontaneously abortion on 3rd preg     triploid dna on path    Family History  Problem Relation Age of Onset  . Colon cancer Father        dx'd late to mid 80's  . Other Father        tinnitus  . Heart attack Father   . Depression Mother   . Anxiety disorder Mother   . Dementia Mother   . Other Mother        high calcium  . Hearing loss Mother        death  . Pulmonary disease Mother   . Stroke Other  silent  . Other Brother        elevated LFTs  . Healthy Daughter   . Healthy Daughter   . Colon polyps Neg Hx   . Esophageal cancer Neg Hx   . Rectal cancer Neg Hx   . Stomach cancer Neg Hx   . Breast cancer Neg Hx         Current Outpatient Medications:  .  Calcium Carb-Cholecalciferol (CALCIUM 600/VITAMIN D3) 600-800 MG-UNIT TABS, Take 1 tablet by mouth daily., Disp: , Rfl:  .  chlorthalidone (HYGROTON) 25 MG tablet, TAKE 1 TABLET BY MOUTH EVERY DAY (Patient taking differently: Take 25 mg by mouth daily. ), Disp: 90 tablet, Rfl: 0 .  cholecalciferol (VITAMIN D3) 25 MCG (1000 UT) tablet, Take 1,000 Units by mouth daily., Disp: , Rfl:  .  citalopram (CELEXA) 10 MG tablet, Take 1 tablet (10 mg total) by mouth daily., Disp: 90 tablet, Rfl: 1 .  diazepam (VALIUM) 2 MG tablet, Take 2 mg by mouth as needed (for Meniere's flare up). , Disp: , Rfl:  .  divalproex  (DEPAKOTE ER) 500 MG 24 hr tablet, Take 1 tablet (500 mg total) by mouth daily., Disp: 30 tablet, Rfl: 1 .  LORazepam (ATIVAN) 0.5 MG tablet, 1-2 po if needed for anxiety up to every 8 hours (Patient taking differently: Take 0.5-1 mg by mouth as needed (for meniere's flare up). ), Disp: 24 tablet, Rfl: 0 .  ondansetron (ZOFRAN-ODT) 4 MG disintegrating tablet, Take 4 mg by mouth every 8 (eight) hours as needed. , Disp: , Rfl: 1 .  TURMERIC PO, Take by mouth daily., Disp: , Rfl:   Current Facility-Administered Medications:  .  0.9 %  sodium chloride infusion, 500 mL, Intravenous, Once, Gatha Mayer, MD  EXAM: BP Readings from Last 3 Encounters:  01/13/19 96/86  01/11/19 130/66  11/10/18 118/70   Wt Readings from Last 3 Encounters:  01/21/19 119 lb (54 kg)  01/12/19 122 lb (55.3 kg)  01/11/19 122 lb (55.3 kg)     VITALS per patient if applicable: looks quite well  And nl speech  And affect   GENERAL: alert, oriented, appears well and in no acute distress  HEENT: atraumatic, conjunttiva clear, no obvious abnormalities on inspection of external nose and ears  NECK: normal movements of the head and neck  LUNGS: on inspection no signs of respiratory distress, breathing rate appears normal, no obvious gross SOB, gasping or wheezing  CV: no obvious cyanosis PSYCH/NEURO: pleasant and cooperative, no obvious depression or anxiety, speech and thought processing grossly intact Lab Results  Component Value Date   WBC 3.8 (L) 01/13/2019   HGB 12.3 01/13/2019   HCT 34.4 (L) 01/13/2019   PLT 234 01/13/2019   GLUCOSE 88 01/13/2019   CHOL 262 (H) 01/13/2019   TRIG 64 01/13/2019   HDL 64 01/13/2019   LDLDIRECT 129.1 02/16/2013   LDLCALC 185 (H) 01/13/2019   ALT 46 (H) 01/11/2019   AST 32 01/11/2019   NA 139 01/13/2019   K 3.8 01/13/2019   CL 102 01/13/2019   CREATININE 1.06 (H) 01/13/2019   BUN 10 01/13/2019   CO2 26 01/13/2019   TSH 0.84 11/10/2018   INR 1.0 01/12/2019   HGBA1C  5.2 01/13/2019    ASSESSMENT AND PLAN:  Discussed the following assessment and plan:    ICD-10-CM   1. Seizure disorder ([place])  ?  0000000 Basic metabolic panel    CBC with Differential    Hepatic function  panel    Lipid panel    Valproic Acid Level   presenting  wtih aphasia and hand numbness placed on depakote  2. Hospital discharge follow-up  [id number]   3. Medication management  [id number] Basic metabolic panel    CBC with Differential    Hepatic function panel    Lipid panel    Valproic Acid Level  4. Elevated LFTs  [id number] Basic metabolic panel    CBC with Differential    Hepatic function panel    Lipid panel    Valproic Acid Level  5. 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 Elevated cholesterol  [id number] Basic metabolic panel    CBC with Differential    Hepatic function panel    Lipid panel    Valproic Acid Level  7. Meniere's disease, unspecified laterality  0000000 Basic metabolic panel    CBC with Differential    Hepatic function panel    Lipid panel    Valproic Acid Level     Presumed seizure  Disorder vs atypical migraine vs  Other    Now on depakote er   Will plan on labs before she sees Dr Leonie Man and follow .   Mild atherosclerosis  Carotid   And elevated ldl consider adding statin in future but reasonable to wait until next check. Elevated homocysteine  with  Nl b12 mma  Pending  etc  prothrombic gene heterozygous   reported to patient  Record review  Also noted, lan creatinine FU ( was 1.4 in hosp 1 at dc ) She has hx of transaminitis evaluated at length and following . Fu lfts indicated to follow esp since now on valproate.  Advise fu lab to monitor renal liver function etc .  In January  Counseled.   Expectant management and discussion of plan and treatment with opportunity to ask questions and all were answered. The patient agreed with the plan and demonstrated an understanding of the instructions.   Advised to call back or seek an in-person  evaluation if worsening  or having  further concerns . Return for am labs mid to  end of January to include lipid lfts cbc bmp and depakote level if advised  and fu.  Shanon Ace, MD

## 2019-01-26 NOTE — Progress Notes (Signed)
MMA is normal  not  likely to be significant   vit b12 orf olate deficiency causing  the elevated homocysteine but ok to take just in case .

## 2019-01-29 NOTE — Progress Notes (Signed)
Labs have been added

## 2019-01-29 NOTE — Addendum Note (Signed)
Addended by: Modena Morrow R on: 01/29/2019 04:05 PM   Modules accepted: Orders

## 2019-02-11 ENCOUNTER — Other Ambulatory Visit: Payer: Self-pay

## 2019-02-12 ENCOUNTER — Other Ambulatory Visit (INDEPENDENT_AMBULATORY_CARE_PROVIDER_SITE_OTHER): Payer: PPO

## 2019-02-12 DIAGNOSIS — Z148 Genetic carrier of other disease: Secondary | ICD-10-CM

## 2019-02-12 DIAGNOSIS — G40909 Epilepsy, unspecified, not intractable, without status epilepticus: Secondary | ICD-10-CM | POA: Diagnosis not present

## 2019-02-12 DIAGNOSIS — E559 Vitamin D deficiency, unspecified: Secondary | ICD-10-CM

## 2019-02-12 DIAGNOSIS — D6852 Prothrombin gene mutation: Secondary | ICD-10-CM | POA: Diagnosis not present

## 2019-02-12 DIAGNOSIS — E7800 Pure hypercholesterolemia, unspecified: Secondary | ICD-10-CM

## 2019-02-12 DIAGNOSIS — Z09 Encounter for follow-up examination after completed treatment for conditions other than malignant neoplasm: Secondary | ICD-10-CM | POA: Diagnosis not present

## 2019-02-12 DIAGNOSIS — Z79899 Other long term (current) drug therapy: Secondary | ICD-10-CM | POA: Diagnosis not present

## 2019-02-12 DIAGNOSIS — M810 Age-related osteoporosis without current pathological fracture: Secondary | ICD-10-CM

## 2019-02-12 DIAGNOSIS — H8109 Meniere's disease, unspecified ear: Secondary | ICD-10-CM | POA: Diagnosis not present

## 2019-02-12 DIAGNOSIS — R7989 Other specified abnormal findings of blood chemistry: Secondary | ICD-10-CM | POA: Diagnosis not present

## 2019-02-12 LAB — CBC WITH DIFFERENTIAL/PLATELET
Basophils Absolute: 0 10*3/uL (ref 0.0–0.1)
Basophils Relative: 0.6 % (ref 0.0–3.0)
Eosinophils Absolute: 0.1 10*3/uL (ref 0.0–0.7)
Eosinophils Relative: 1.8 % (ref 0.0–5.0)
HCT: 37.8 % (ref 36.0–46.0)
Hemoglobin: 13 g/dL (ref 12.0–15.0)
Lymphocytes Relative: 30.2 % (ref 12.0–46.0)
Lymphs Abs: 1.4 10*3/uL (ref 0.7–4.0)
MCHC: 34.3 g/dL (ref 30.0–36.0)
MCV: 94.9 fl (ref 78.0–100.0)
Monocytes Absolute: 0.3 10*3/uL (ref 0.1–1.0)
Monocytes Relative: 6.6 % (ref 3.0–12.0)
Neutro Abs: 2.8 10*3/uL (ref 1.4–7.7)
Neutrophils Relative %: 60.8 % (ref 43.0–77.0)
Platelets: 185 10*3/uL (ref 150.0–400.0)
RBC: 3.98 Mil/uL (ref 3.87–5.11)
RDW: 12.5 % (ref 11.5–15.5)
WBC: 4.6 10*3/uL (ref 4.0–10.5)

## 2019-02-12 LAB — IBC + FERRITIN
Ferritin: 47.4 ng/mL (ref 10.0–291.0)
Iron: 82 ug/dL (ref 42–145)
Saturation Ratios: 21.1 % (ref 20.0–50.0)
Transferrin: 278 mg/dL (ref 212.0–360.0)

## 2019-02-12 LAB — BASIC METABOLIC PANEL
BUN: 24 mg/dL — ABNORMAL HIGH (ref 6–23)
CO2: 34 mEq/L — ABNORMAL HIGH (ref 19–32)
Calcium: 10 mg/dL (ref 8.4–10.5)
Chloride: 97 mEq/L (ref 96–112)
Creatinine, Ser: 0.93 mg/dL (ref 0.40–1.20)
GFR: 59.91 mL/min — ABNORMAL LOW (ref >60.00)
Glucose, Bld: 92 mg/dL (ref 70–99)
Potassium: 3.7 mEq/L (ref 3.5–5.1)
Sodium: 139 mEq/L (ref 135–145)

## 2019-02-12 LAB — TSH: TSH: 1.41 u[IU]/mL (ref 0.35–4.50)

## 2019-02-12 LAB — HEPATIC FUNCTION PANEL
ALT: 16 U/L (ref 0–35)
AST: 19 U/L (ref 0–37)
Albumin: 4.5 g/dL (ref 3.5–5.2)
Alkaline Phosphatase: 57 U/L (ref 39–117)
Bilirubin, Direct: 0.2 mg/dL (ref 0.0–0.3)
Total Bilirubin: 1.2 mg/dL (ref 0.2–1.2)
Total Protein: 6.9 g/dL (ref 6.0–8.3)

## 2019-02-12 LAB — LIPID PANEL
Cholesterol: 222 mg/dL — ABNORMAL HIGH (ref 0–200)
HDL: 63.3 mg/dL (ref >39.00)
LDL Cholesterol: 142 mg/dL — ABNORMAL HIGH (ref 0–99)
NonHDL: 158.51
Total CHOL/HDL Ratio: 4
Triglycerides: 83 mg/dL (ref 0.0–149.0)
VLDL: 16.6 mg/dL (ref 0.0–40.0)

## 2019-02-12 LAB — VITAMIN D 25 HYDROXY (VIT D DEFICIENCY, FRACTURES): VITD: 39.77 ng/mL (ref 30.00–100.00)

## 2019-02-15 LAB — HOMOCYSTEINE: Homocysteine: 23.7 umol/L — ABNORMAL HIGH (ref <10.4)

## 2019-02-15 LAB — VALPROIC ACID LEVEL: Valproic Acid Lvl: 45.4 mg/L — ABNORMAL LOW (ref 50.0–100.0)

## 2019-02-21 ENCOUNTER — Ambulatory Visit: Payer: PPO

## 2019-02-24 ENCOUNTER — Ambulatory Visit: Payer: PPO | Admitting: Adult Health

## 2019-02-24 ENCOUNTER — Encounter: Payer: Self-pay | Admitting: Adult Health

## 2019-02-24 ENCOUNTER — Other Ambulatory Visit: Payer: Self-pay

## 2019-02-24 VITALS — BP 118/60 | HR 58 | Temp 96.9°F | Ht 64.0 in | Wt 120.6 lb

## 2019-02-24 DIAGNOSIS — I10 Essential (primary) hypertension: Secondary | ICD-10-CM | POA: Diagnosis not present

## 2019-02-24 DIAGNOSIS — R4189 Other symptoms and signs involving cognitive functions and awareness: Secondary | ICD-10-CM

## 2019-02-24 DIAGNOSIS — E785 Hyperlipidemia, unspecified: Secondary | ICD-10-CM

## 2019-02-24 DIAGNOSIS — R569 Unspecified convulsions: Secondary | ICD-10-CM | POA: Diagnosis not present

## 2019-02-24 DIAGNOSIS — R42 Dizziness and giddiness: Secondary | ICD-10-CM

## 2019-02-24 MED ORDER — DIVALPROEX SODIUM ER 500 MG PO TB24: 500.0000 mg | ORAL_TABLET | Freq: Every day | ORAL | 3 refills | Status: DC

## 2019-02-24 NOTE — Progress Notes (Signed)
[facility] [address] [place]. [place] [address] [phone]       HOSPITAL FOLLOW UP NOTE  Ms. Donna Warren Date of Birth:  [birth year] Medical Record Number:  [id number]   Reason for Referral:  hospital stroke follow up    CHIEF COMPLAINT:  Chief Complaint  Patient presents with  . Hospitalization Follow-up    Alone. Rm 9. No new concerns at this time.     HPI: Donna Warren being seen today for in office hospital follow-up regarding likely focal seizures versus complicated migraine on [id number].  History obtained from patient and chart review. Reviewed all radiology images and labs personally.  Donna Warren is a 69 y.o. female 69 y.o.femalewho presented on 01/12/2019 with transient speech difficulties and right hand paresthesias.  Evaluated by stroke team and Dr. Leonie Man with completion of stroke work-up and recurrent episodes possibly focal seizures versus complicated migraine less likely stroke or TIA.  CT head and MRI brain negative.  CTA head/neck mild arthrosclerotic changes in cavernous internal carotids bilaterally.  2D echo unremarkable.  COVID-19 negative. EEG findings are c/w left temporal region dysfunction. She was started on Depakote 500mg  ER daily and recommended repeat EEG at follow-up visit.  Recommend initiating aspirin 81 mg daily for stroke prevention.  HTN stable and recommended continuation of home dose chlorthalidone.  LDL 185 and recommended follow-up with PCP for possibly initiating statin therapy -was not done during admission due to no evidence of stroke and low suspicion for TIA.  No evidence or history of DM with A1c 5.2.  No other stroke risk factors.  No prior history of stroke.  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  Discharged home in stable condition without therapy needs.  Ms. Donna Warren is a 69 year old female who is being seen today for hospital follow-up.   She has been doing well since discharge without reoccurring transient speech difficulties or right hand paresthesias.  She has continued on Depakote ER 500 mg daily but does have concerns regarding possible side effects including dizziness and bowel movement changes.  She does have underlying history of Mnire's disease but feels as though dizziness since starting Depakote different from Mnire's dizziness.  She has not had follow-up with otolaryngology provider Dr. Thornell Mule since discharge.  She continues on aspirin 81 mg daily without bleeding or bruising.  Blood pressure today 118/60.  She does question past year short-term memory loss with concerns of possibility of dementia or Alzheimer's and questions possible need of further testing.  She is currently working as a Education officer, museum running a Public librarian.  She does endorse increased stressors especially since COVID-19 pandemic and is a "type A" person placing increased pressure and stress on herself.  She was recently started on citalopram by PCP with some benefit.  Examples of short-term memory loss includes being told something and at times will have difficulty remembering.  She is able to maintain ADLs and all IADLs without difficulty.  No further concerns at this time.    ROS:   14 system review of systems performed and negative with exception of short-term memory loss, dizziness  PMH:  Past Medical History:  Diagnosis Date  . 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 Arthritis   . Cataract    developing  . Femur fracture, right ([place]) 2003  . Fibroids   . Gilbert's syndrome   .  History of liver biopsy nl    records of fatty liver 2008 not confirmed   . Hx of adenomatous colonic polyps   . Meniere disease    Right ear  . Osteoporosis   . Osteoporosis   . Other hemochromatosis    heterozygosity for h63d gene  . Spontaneous abortion     PSH:    Past Surgical History:  Procedure Laterality Date  . BREAST CYST EXCISION Right   . [place]  . COLONOSCOPY  220, 2004, 2007, 08/13/2010   2002: 8 mm polyp 2004: no polyps 2007: no polyps 2012: hemorrhoids  . hosp r/o mi cv right chest and jaw pain  2010  . LIVER BIOPSY     2015 negative   no fatty liver  records  2008 said fatty liver    . OVARIAN CYST REMOVAL    . POLYPECTOMY    . spontaneously abortion on 3rd preg     triploid dna on path    Social History:  Social History   Socioeconomic History  . Marital status: Married    Spouse name: Not on file  . Number of children: Not on file  . Years of education: Not on file  . Highest education level: Not on file  Occupational History  . Not on file  Tobacco Use  . Smoking status: Never Smoker  . Smokeless tobacco: Never Used  Substance and Sexual Activity  . Alcohol use: Yes    Alcohol/week: 1.0 - 2.0 standard drinks    Types: 1 - 2 Glasses of wine per week    Comment: glass of wine a week  . Drug use: No  . Sexual activity: Not on file  Other Topics Concern  . Not on file  Social History Narrative        2 daughters   Husband a [employer] director  Working 28 - 19.  Travels    Daily caffeine use   Sleep  Often disrupted.    Alcohol about once a week.   Hof 2 pet dogs   Social Determinants of Health   Financial Resource Strain:   . Difficulty of Paying Living Expenses: Not on file  Food Insecurity:   . Worried About Charity fundraiser in the Last Year: Not on file  . Ran Out of Food in the Last Year: Not on file  Transportation Needs:   . Lack of Transportation (Medical): Not on file  . Lack of Transportation (Non-Medical): Not on file  Physical Activity:   . Days of Exercise per Week: Not on file  . Minutes of Exercise per Session: Not on file  Stress:   . Feeling of Stress : Not on file  Social Connections:   . Frequency of Communication with Friends and  Family: Not on file  . Frequency of Social Gatherings with Friends and Family: Not on file  . Attends Religious Services: Not on file  . Active Member of Clubs or Organizations: Not on file  . Attends Archivist Meetings: Not on file  . Marital Status: Not on file  Intimate Partner Violence:   . Fear of Current or Ex-Partner: Not on file  . Emotionally Abused: Not on file  . Physically Abused: Not on file  . Sexually Abused: Not on file    Family History:  Family History  Problem Relation Age of Onset  . Colon cancer Father  dx'd late to mid 70's  . Other Father        tinnitus  . Heart attack Father   . Depression Mother   . Anxiety disorder Mother   . Dementia Mother   . Other Mother        high calcium  . Hearing loss Mother        death  . Pulmonary disease Mother   . Stroke Other        silent  . Other Brother        elevated LFTs  . Healthy Daughter   . Healthy Daughter   . Colon polyps Neg Hx   . Esophageal cancer Neg Hx   . Rectal cancer Neg Hx   . Stomach cancer Neg Hx   . Breast cancer Neg Hx     Medications:   Current Outpatient Medications on File Prior to Visit  Medication Sig Dispense Refill  . aspirin EC 81 MG tablet Take 81 mg by mouth daily.    . Calcium Carb-Cholecalciferol (CALCIUM 600/VITAMIN D3) 600-800 MG-UNIT TABS Take 1 tablet by mouth daily.    . chlorthalidone (HYGROTON) 25 MG tablet TAKE 1 TABLET BY MOUTH EVERY DAY (Patient taking differently: Take 25 mg by mouth daily. ) 90 tablet 0  . cholecalciferol (VITAMIN D3) 25 MCG (1000 UT) tablet Take 1,000 Units by mouth daily.    . citalopram (CELEXA) 10 MG tablet Take 1 tablet (10 mg total) by mouth daily. 90 tablet 1  . diazepam (VALIUM) 2 MG tablet Take 2 mg by mouth as needed (for Meniere's flare up).     . divalproex (DEPAKOTE ER) 500 MG 24 hr tablet Take 1 tablet (500 mg total) by mouth daily. 30 tablet 1  . LORazepam (ATIVAN) 0.5 MG tablet 1-2 po if needed for anxiety up  to every 8 hours (Patient taking differently: Take 0.5-1 mg by mouth as needed (for meniere's flare up). ) 24 tablet 0  . ondansetron (ZOFRAN-ODT) 4 MG disintegrating tablet Take 4 mg by mouth every 8 (eight) hours as needed.   1  . TURMERIC PO Take by mouth daily.     Current Facility-Administered Medications on File Prior to Visit  Medication Dose Route Frequency Provider Last Rate Last Admin  . 0.9 %  sodium chloride infusion  500 mL Intravenous Once Gatha Mayer, MD        Allergies:   Allergies  Allergen Reactions  . Penicillins     Did it involve swelling of the face/tongue/throat, SOB, or low BP? No Did it involve sudden or severe rash/hives, skin peeling, or any reaction on the inside of your mouth or nose? No Did you need to seek medical attention at a hospital or doctor's office? No When did it last happen? If all above answers are "NO", may proceed with cephalosporin use.     Physical Exam  Vitals:   02/24/19 0901  BP: 118/60  Pulse: (!) 58  Temp: (!) 96.9 F (36.1 C)  TempSrc: Oral  Weight: 120 lb 9.6 oz (54.7 kg)  Height: 5\' 4"  (1.626 m)   Body mass index is 20.7 kg/m. No exam data present   General: well developed, well nourished,  pleasant middle-age Caucasian female, seated, in no evident distress Head: head normocephalic and atraumatic.   Neck: supple with no carotid or supraclavicular bruits Cardiovascular: regular rate and rhythm, no murmurs Musculoskeletal: no deformity Skin:  no rash/petichiae Vascular:  Normal pulses all extremities   Neurologic  Exam Mental Status: Awake and fully alert.   Normal speech and language oriented to place and time. Recent and remote memory intact. Attention span, concentration and fund of knowledge appropriate. Mood and affect appropriate.  Cranial Nerves: Fundoscopic exam reveals sharp disc margins. Pupils equal, briskly reactive to light. Extraocular movements full without nystagmus. Visual fields full to  confrontation. Hearing intact. Facial sensation intact. Face, tongue, palate moves normally and symmetrically.  Motor: Normal bulk and tone. Normal strength in all tested extremity muscles. Sensory.: intact to touch , pinprick , position and vibratory sensation.  Coordination: Rapid alternating movements normal in all extremities. Finger-to-nose and heel-to-shin performed accurately bilaterally. Gait and Station: Arises from chair without difficulty. Stance is normal. Gait demonstrates normal stride length and balance Reflexes: 1+ and symmetric. Toes downgoing.       Diagnostic Data (Labs, Imaging, Testing)  CT HEAD WO CONTRAST 01/12/2019 IMPRESSION: Normal head CT.  CT ANGIO HEAD W OR WO CONTRAST CT ANGIO NECK W OR WO CONTRAST 01/12/2019 IMPRESSION: 1. Normal variant CTA Circle of Willis without significant proximal stenosis, aneurysm, or branch vessel occlusion. 2. Mild atherosclerotic changes within the cavernous internal carotid arteries bilaterally without significant stenosis.  MR BRAIN WO CONTRAST 01/12/2019 IMPRESSION: Negative brain MRI  ECHOCARDIOGRAM 01/12/2019 IMPRESSIONS  1. Left ventricular ejection fraction, by visual estimation, is 55 to  60%. The left ventricle has normal function. There is no left ventricular  hypertrophy.  2. The left ventricle has no regional wall motion abnormalities.  3. Global right ventricle has normal systolic function.The right  ventricular size is normal. No increase in right ventricular wall  thickness.  4. Left atrial size was normal.  5. Right atrial size was normal.  6. The mitral valve is normal in structure. No evidence of mitral valve  regurgitation.  7. The tricuspid valve is normal in structure. Tricuspid valve  regurgitation is mild.  8. The aortic valve is normal in structure. Aortic valve regurgitation is  not visualized.  9. The pulmonic valve was grossly normal. Pulmonic valve regurgitation is    trivial.  10. Normal pulmonary artery systolic pressure.  11. The inferior vena cava is normal in size with greater than 50%  respiratory variability, suggesting right atrial pressure of 3 mmHg.   EEG adult 01/13/2019 IMPRESSION: This study is suggestive of non specific cortical dysfunction in the left temporal region. No seizures or definite epileptiform discharges were seen throughout the recording. However, only wakefulness and drowsiness were recorded. If suspicion for interictal activity remains a concern, a prolonged study including sleep can be considered.      ASSESSMENT: Donna Warren is a 69 y.o. year old female presented with transient speech difficulties and right hand paresthesias on 01/12/2019 likely focal seizures versus complicated migraine and initiated Depakote 500 mg ER daily.  EEG consistent with left temporal region dysfunction. Vascular risk factors include HTN and HLD.  She has been doing well since discharge without reoccurring transient speech or right hand symptoms but does question possible side effect of Depakote including dizziness and change in bowel movements.  Underlying history of Mnire's disease but reports different type of dizziness.  She also has concerns regarding past year short-term memory concerns.    PLAN:  1. Focal seizure versus migraine: Due to possible side effect of Depakote, recommend trialing medication prior to bed as she is currently taking in the morning.  Advised to continue for 1 week and to call office if symptoms persist.  We will repeat EEG as  recommended during hospitalization.  Also highly encouraged scheduling follow-up visit with establish otolaryngology provider for follow-up regarding Mnire's disease.  Continue aspirin 81 mg daily   for stroke prevention. Maintain strict control of hypertension with blood pressure goal below 130/90, diabetes with hemoglobin A1c goal below 6.5% and cholesterol with LDL cholesterol (bad cholesterol)  goal below 70 mg/dL.  I also advised the patient to eat a healthy diet with plenty of whole grains, cereals, fruits and vegetables, exercise regularly with at least 30 minutes of continuous activity daily and maintain ideal body weight. 2. HTN: Advised to continue current treatment regimen.  Today's BP stable.  Advised to continue to monitor at home along with continued follow-up with PCP for management 3. HLD: Follow-up with PCP for monitoring and management  4. Short term memory concerns: discussion regarding likely due to increased stressors with possible underlying depression and anxiety.  Discussion regarding importance of stress reduction techniques, ensuring adequate exercise, and healthy eating.  Advised patient with additional education regarding short-term memory complaints related to stress.  Advised to continue to monitor and if worsens, to discuss this further with PCP who can evaluate further.    Follow up in 4 months or call earlier if needed   Greater than 50% of time during this 45 minute visit was spent on counseling, explanation of diagnosis of focal seizure versus complicated migraine, reviewing risk factor management of HTN and HLD, discussion regarding possible side effects of Depakote, discussion regarding short-term memory concerns, planning of further management along with potential future management, and discussion with patient answered all questions to satisfaction   Frann Rider, AGNP-BC  [facility] [address] [place], [place] [address]  Phone [phone] Fax [phone] Note: This document was prepared with digital dictation and possible smart phrase technology. Any transcriptional errors that result from this process are unintentional.

## 2019-02-24 NOTE — Patient Instructions (Addendum)
Your Plan:  Continue depakote ER 500 mg nightly - please let me know if you continue to have concerns  Increase exercise and activity along with stress reduction techniques  You will schedule EEG today prior to leaving - we will call when we get your results    Follow up in 4 months or call earlier if needed      Thank you for coming to see [place] at [facility]. I hope we have been able to provide you high quality care today.  You may receive a patient satisfaction survey over the next few weeks. We would appreciate your feedback and comments so that we may continue to improve ourselves and the health of our patients.

## 2019-02-24 NOTE — Progress Notes (Signed)
I agree with the above plan 

## 2019-02-26 ENCOUNTER — Ambulatory Visit: Payer: PPO

## 2019-02-27 ENCOUNTER — Ambulatory Visit: Payer: PPO | Attending: Internal Medicine

## 2019-02-27 DIAGNOSIS — Z23 Encounter for immunization: Secondary | ICD-10-CM

## 2019-02-27 NOTE — Progress Notes (Signed)
   Covid-19 Vaccination Clinic  Name:  Donna Warren    MRN: [id number] DOB: 1950/06/18  02/27/2019  Donna Warren was observed post Covid-19 immunization for 15 minutes without incidence. She was provided with Vaccine Information Sheet and instruction to access the V-Safe system.   Donna Warren was instructed to call 911 with any severe reactions post vaccine: Marland Kitchen Difficulty breathing  . Swelling of your face and throat  . A fast heartbeat  . A bad rash all over your body  . Dizziness and weakness    Immunizations Administered    Name Date Dose VIS Date Route   Pfizer COVID-19 Vaccine 02/27/2019 12:16 PM 0.3 mL 01/01/2019 Intramuscular   Manufacturer: [place]   Lot: [id number]   [place]: [id number]

## 2019-03-04 ENCOUNTER — Other Ambulatory Visit: Payer: Self-pay

## 2019-03-04 ENCOUNTER — Ambulatory Visit (INDEPENDENT_AMBULATORY_CARE_PROVIDER_SITE_OTHER): Payer: PPO | Admitting: Neurology

## 2019-03-04 DIAGNOSIS — R569 Unspecified convulsions: Secondary | ICD-10-CM

## 2019-03-06 ENCOUNTER — Encounter: Payer: Self-pay | Admitting: Internal Medicine

## 2019-03-08 ENCOUNTER — Telehealth: Payer: Self-pay

## 2019-03-08 NOTE — Telephone Encounter (Signed)
Called the pt to go over the recent results of her EEG per NP Frann Rider. Unfortunately, I was not able to get in touch with her.  Left a VM.  Please relay EEG Results.   "Please advise patient that recent EEG unremarkable"-NP [employer].

## 2019-03-12 ENCOUNTER — Telehealth: Payer: Self-pay | Admitting: Adult Health

## 2019-03-12 NOTE — Telephone Encounter (Signed)
Pt is wanting to see Dr. Leonie Man not NP and is wanting to see him sooner than scheduled appt. Pt would like RN to call her back. Please advise.

## 2019-03-13 ENCOUNTER — Encounter: Payer: Self-pay | Admitting: Adult Health

## 2019-03-16 NOTE — Telephone Encounter (Signed)
Nothing sooner with Dr. Leonie Man.  JM/NP is at 04-21-19.

## 2019-03-18 DIAGNOSIS — H905 Unspecified sensorineural hearing loss: Secondary | ICD-10-CM | POA: Diagnosis not present

## 2019-03-18 DIAGNOSIS — H8101 Meniere's disease, right ear: Secondary | ICD-10-CM | POA: Diagnosis not present

## 2019-03-24 ENCOUNTER — Ambulatory Visit: Payer: PPO | Attending: Internal Medicine

## 2019-03-24 DIAGNOSIS — Z23 Encounter for immunization: Secondary | ICD-10-CM

## 2019-03-24 NOTE — Progress Notes (Signed)
   Covid-19 Vaccination Clinic  Name:  Donna Warren    MRN: [id number] DOB: May 18, 1950  03/24/2019  Donna Warren was observed post Covid-19 immunization for 15 minutes without incident. She was provided with Vaccine Information Sheet and instruction to access the V-Safe system.   Donna Warren was instructed to call 911 with any severe reactions post vaccine: Marland Kitchen Difficulty breathing  . Swelling of face and throat  . A fast heartbeat  . A bad rash all over body  . Dizziness and weakness   Immunizations Administered    Name Date Dose VIS Date Route   Pfizer COVID-19 Vaccine 03/24/2019 10:27 AM 0.3 mL 01/01/2019 Intramuscular   Manufacturer: [place]   Lot: [id number]   [place]: [id number]

## 2019-03-29 ENCOUNTER — Other Ambulatory Visit: Payer: Self-pay | Admitting: Internal Medicine

## 2019-03-29 DIAGNOSIS — H905 Unspecified sensorineural hearing loss: Secondary | ICD-10-CM

## 2019-03-29 DIAGNOSIS — M810 Age-related osteoporosis without current pathological fracture: Secondary | ICD-10-CM

## 2019-03-29 DIAGNOSIS — G40909 Epilepsy, unspecified, not intractable, without status epilepticus: Secondary | ICD-10-CM

## 2019-03-29 DIAGNOSIS — H8109 Meniere's disease, unspecified ear: Secondary | ICD-10-CM

## 2019-03-29 NOTE — Progress Notes (Signed)
Ordering lab as per requested Dr Thornell Mule for vasculitis eval  Cause of pat sx . For [place] office lab.  Unable to find anti collagen II   aby  To order will ask lab to help  Finding out order for this

## 2019-04-01 ENCOUNTER — Telehealth: Payer: Self-pay

## 2019-04-01 DIAGNOSIS — H8101 Meniere's disease, right ear: Secondary | ICD-10-CM | POA: Diagnosis not present

## 2019-04-01 NOTE — Telephone Encounter (Signed)
lvm for pt in regards to pt message sent by Dr.Panosh

## 2019-04-12 DIAGNOSIS — Z1159 Encounter for screening for other viral diseases: Secondary | ICD-10-CM | POA: Diagnosis not present

## 2019-04-12 DIAGNOSIS — R42 Dizziness and giddiness: Secondary | ICD-10-CM | POA: Diagnosis not present

## 2019-04-12 DIAGNOSIS — H905 Unspecified sensorineural hearing loss: Secondary | ICD-10-CM | POA: Diagnosis not present

## 2019-04-21 ENCOUNTER — Ambulatory Visit: Payer: PPO | Admitting: Adult Health

## 2019-04-27 ENCOUNTER — Other Ambulatory Visit: Payer: Self-pay

## 2019-04-27 ENCOUNTER — Ambulatory Visit: Payer: PPO | Admitting: Neurology

## 2019-04-27 ENCOUNTER — Encounter: Payer: Self-pay | Admitting: Neurology

## 2019-04-27 VITALS — BP 108/72 | HR 82 | Temp 97.5°F | Ht 64.0 in | Wt 123.6 lb

## 2019-04-27 DIAGNOSIS — R42 Dizziness and giddiness: Secondary | ICD-10-CM | POA: Diagnosis not present

## 2019-04-27 NOTE — Progress Notes (Signed)
[facility] [address] [place]. [place] [address] [phone]       OFFICE FOLLOW UP NOTE  Ms. Donna Warren Date of Birth:  01/22/1950 Medical Record Number:  [id number]   Reason for Referral:  hospital stroke follow up    CHIEF COMPLAINT:  Chief Complaint  Patient presents with  . Follow-up    Focal seizure room 2 pt prefers MD has seen NP in the past for her neurological issues pt was schedule with Janett Billow NP on 04/21/2019 and cancel appt and schedule with NP  pt is here today for chronic dizzy spells, meneire disease sees ENT at [facility] wants to discuss the focal seizure and overalll long term plan room 2     HPI: Initial visit 02/24/2019 [name], NP ) Montel Culver being seen today for in office hospital follow-up regarding likely focal seizures versus complicated migraine on [id number].  History obtained from patient and chart review. Reviewed all radiology images and labs personally.  Ms. Donna Warren is a 69 y.o. female 68 y.o.femalewho presented on 01/12/2019 with transient speech difficulties and right hand paresthesias.  Evaluated by stroke team and Dr. Leonie Man with completion of stroke work-up and recurrent episodes possibly focal seizures versus complicated migraine less likely stroke or TIA.  CT head and MRI brain negative.  CTA head/neck mild arthrosclerotic changes in cavernous internal carotids bilaterally.  2D echo unremarkable.  COVID-19 negative. EEG findings are c/w left temporal region dysfunction. She was started on Depakote 500mg  ER daily and recommended repeat EEG at follow-up visit.  Recommend initiating aspirin 81 mg daily for stroke prevention.  HTN stable and recommended continuation of home dose chlorthalidone.  LDL 185 and recommended follow-up with PCP for possibly initiating statin therapy -was not done during admission due to no evidence of stroke and low suspicion for TIA.  No evidence or history of DM with A1c  5.2.  No other stroke risk factors.  No prior history of stroke.  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  Discharged home in stable condition without therapy needs.  Ms. Donna Warren is a 69 year old female who is being seen today for hospital follow-up.  She has been doing well since discharge without reoccurring transient speech difficulties or right hand paresthesias.  She has continued on Depakote ER 500 mg daily but does have concerns regarding possible side effects including dizziness and bowel movement changes.  She does have underlying history of Mnire's disease but feels as though dizziness since starting Depakote different from Mnire's dizziness.  She has not had follow-up with otolaryngology provider Dr. Thornell Mule since discharge.  She continues on aspirin 81 mg daily without bleeding or bruising.  Blood pressure today 118/60.  She does question past year short-term memory loss with concerns of possibility of dementia or Alzheimer's and questions possible need of further testing.  She is currently working as a Education officer, museum running a Public librarian.  She does endorse increased stressors especially since COVID-19 pandemic and is a "type A" person placing increased pressure and stress on herself.  She was recently started on citalopram by PCP with some benefit.  Examples of short-term memory loss includes being told something and at times will have difficulty remembering.  She is able to maintain ADLs and all IADLs without difficulty.  No further concerns at this time.  Update 04/27/2019 : Ms. Donna Warren is seen upon her request today.  She states her main  concern today is she is having dizziness but she states she has Mnire's disease which is episodic and has had dizziness in the past.  She read some of the side effects of Depakote which she is taking and wonders if it may be causing dizziness.  She describes this as feeling of  slight imbalance which is intermittent.  She denies true vertigo or significant gait or balance problems.  She is currently on Depakote ER 500 mg daily and states it is working well since she has had no further episodes of recurrent complicated migraine or seizure-like episode.  She was seen recently by my nurse practitioner but feels she needs to see a physician.  She has no new complaints.  She remains on aspirin which is tolerating well.  Blood pressures well controlled today it is 108/72.  She had a repeat EEG on 03/04/2019 which was normal.  ROS:   14 system review of systems performed and negative with exception of short-term memory loss, dizziness  PMH:  Past Medical History:  Diagnosis Date  . 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 Arthritis   . Cataract    developing  . Femur fracture, right ([place]) 2003  . Fibroids   . Gilbert's syndrome   . History of liver biopsy nl    records of fatty liver 2008 not confirmed   . Hx of adenomatous colonic polyps   . Meniere disease    Right ear  . Osteoporosis   . Osteoporosis   . Other hemochromatosis    heterozygosity for h63d gene  . Spontaneous abortion     PSH:  Past Surgical History:  Procedure Laterality Date  . BREAST CYST EXCISION Right   . [place]  . COLONOSCOPY  220, 2004, 2007, 08/13/2010   2002: 8 mm polyp 2004: no polyps 2007: no polyps 2012: hemorrhoids  . hosp r/o mi cv right chest and jaw pain  2010  . LIVER BIOPSY     2015 negative   no fatty liver  records  2008 said fatty liver    . OVARIAN CYST REMOVAL    . POLYPECTOMY    . spontaneously abortion on 3rd preg     triploid dna on path    Social History:  Social History   Socioeconomic History  . Marital status: Married    Spouse name: Not on file  . Number of children: Not on file  . Years of education: Not on file  . Highest education level: Not on file    Occupational History  . Not on file  Tobacco Use  . Smoking status: Never Smoker  . Smokeless tobacco: Never Used  Substance and Sexual Activity  . Alcohol use: Yes    Alcohol/week: 1.0 - 2.0 standard drinks    Types: 1 - 2 Glasses of wine per week    Comment: glass of wine a week  . Drug use: No  . Sexual activity: Not on file  Other Topics Concern  . Not on file  Social History Narrative        2 daughters   Husband a [place] director  Working 50 - 66.  Travels    Daily caffeine use   Sleep  Often disrupted.    Alcohol about once a week.   Hof 2 pet dogs   Social Determinants of Radio broadcast assistant  Strain:   . Difficulty of Paying Living Expenses:   Food Insecurity:   . Worried About Charity fundraiser in the Last Year:   . Arboriculturist in the Last Year:   Transportation Needs:   . Film/video editor (Medical):   Marland Kitchen Lack of Transportation (Non-Medical):   Physical Activity:   . Days of Exercise per Week:   . Minutes of Exercise per Session:   Stress:   . Feeling of Stress :   Social Connections:   . Frequency of Communication with Friends and Family:   . Frequency of Social Gatherings with Friends and Family:   . Attends Religious Services:   . Active Member of Clubs or Organizations:   . Attends Archivist Meetings:   Marland Kitchen Marital Status:   Intimate Partner Violence:   . Fear of Current or Ex-Partner:   . Emotionally Abused:   Marland Kitchen Physically Abused:   . Sexually Abused:     Family History:  Family History  Problem Relation Age of Onset  . Colon cancer Father        dx'd late to mid 46's  . Other Father        tinnitus  . Heart attack Father   . Depression Mother   . Anxiety disorder Mother   . Dementia Mother   . Other Mother        high calcium  . Hearing loss Mother        death  . Pulmonary disease Mother   . Stroke Other        silent  . Other Brother        elevated LFTs  . Healthy  Daughter   . Healthy Daughter   . Colon polyps Neg Hx   . Esophageal cancer Neg Hx   . Rectal cancer Neg Hx   . Stomach cancer Neg Hx   . Breast cancer Neg Hx     Medications:   Current Outpatient Medications on File Prior to Visit  Medication Sig Dispense Refill  . aspirin EC 81 MG tablet Take 81 mg by mouth daily.    . Calcium Carb-Cholecalciferol (CALCIUM 600/VITAMIN D3) 600-800 MG-UNIT TABS Take 1 tablet by mouth daily.    . chlorthalidone (HYGROTON) 25 MG tablet TAKE 1 TABLET BY MOUTH EVERY DAY (Patient taking differently: Take 25 mg by mouth daily. ) 90 tablet 0  . cholecalciferol (VITAMIN D3) 25 MCG (1000 UT) tablet Take 1,000 Units by mouth daily.    . citalopram (CELEXA) 10 MG tablet Take 1 tablet (10 mg total) by mouth daily. 90 tablet 1  . diazepam (VALIUM) 2 MG tablet Take 2 mg by mouth as needed (for Meniere's flare up).     . divalproex (DEPAKOTE ER) 500 MG 24 hr tablet Take 1 tablet (500 mg total) by mouth at bedtime. 30 tablet 3  . divalproex (DEPAKOTE ER) 500 MG 24 hr tablet Take by mouth.    Marland Kitchen LORazepam (ATIVAN) 0.5 MG tablet 1-2 po if needed for anxiety up to every 8 hours (Patient taking differently: Take 0.5-1 mg by mouth as needed (for meniere's flare up). ) 24 tablet 0  . ondansetron (ZOFRAN-ODT) 4 MG disintegrating tablet Take 4 mg by mouth every 8 (eight) hours as needed.   1  . TURMERIC PO Take by mouth daily.     Current Facility-Administered Medications on File Prior to Visit  Medication Dose Route Frequency Provider Last Rate Last Admin  .  0.9 %  sodium chloride infusion  500 mL Intravenous Once Gatha Mayer, MD        Allergies:   Allergies  Allergen Reactions  . Penicillins     Did it involve swelling of the face/tongue/throat, SOB, or low BP? No Did it involve sudden or severe rash/hives, skin peeling, or any reaction on the inside of your mouth or nose? No Did you need to seek medical attention at a hospital or doctor's office? No When did it  last happen? If all above answers are "NO", may proceed with cephalosporin use.     Physical Exam  Vitals:   04/27/19 1345  BP: 108/72  Pulse: 82  Temp: (!) 97.5 F (36.4 C)  Weight: 56.1 kg  Height: 5\' 4"  (1.626 m)   Body mass index is 21.22 kg/m. No exam data present   General: well developed, well nourished,  pleasant middle-age Caucasian female, seated, in no evident distress Head: head normocephalic and atraumatic.   Neck: supple with no carotid or supraclavicular bruits Cardiovascular: regular rate and rhythm, no murmurs Musculoskeletal: no deformity Skin:  no rash/petichiae Vascular:  Normal pulses all extremities   Neurologic Exam Mental Status: Awake and fully alert.   Normal speech and language oriented to place and time. Recent and remote memory intact. Attention span, concentration and fund of knowledge appropriate. Mood and affect appropriate.  Cranial Nerves: Fundoscopic exam reveals sharp disc margins. Pupils equal, briskly reactive to light. Extraocular movements full without nystagmus. Visual fields full to confrontation. Hearing intact. Facial sensation intact. Face, tongue, palate moves normally and symmetrically.  Motor: Normal bulk and tone. Normal strength in all tested extremity muscles. Sensory.: intact to touch , pinprick , position and vibratory sensation.  Coordination: Rapid alternating movements normal in all extremities. Finger-to-nose and heel-to-shin performed accurately bilaterally. Gait and Station: Arises from chair without difficulty. Stance is normal. Gait demonstrates normal stride length and balance Reflexes: 1+ and symmetric. Toes downgoing.       Diagnostic Data (Labs, Imaging, Testing)  CT HEAD WO CONTRAST 01/12/2019 IMPRESSION: Normal head CT.  CT ANGIO HEAD W OR WO CONTRAST CT ANGIO NECK W OR WO CONTRAST 01/12/2019 IMPRESSION: 1. Normal variant CTA Circle of Willis without significant proximal stenosis, aneurysm,  or branch vessel occlusion. 2. Mild atherosclerotic changes within the cavernous internal carotid arteries bilaterally without significant stenosis.  MR BRAIN WO CONTRAST 01/12/2019 IMPRESSION: Negative brain MRI  ECHOCARDIOGRAM 01/12/2019 IMPRESSIONS  1. Left ventricular ejection fraction, by visual estimation, is 55 to  60%. The left ventricle has normal function. There is no left ventricular  hypertrophy.  2. The left ventricle has no regional wall motion abnormalities.  3. Global right ventricle has normal systolic function.The right  ventricular size is normal. No increase in right ventricular wall  thickness.  4. Left atrial size was normal.  5. Right atrial size was normal.  6. The mitral valve is normal in structure. No evidence of mitral valve  regurgitation.  7. The tricuspid valve is normal in structure. Tricuspid valve  regurgitation is mild.  8. The aortic valve is normal in structure. Aortic valve regurgitation is  not visualized.  9. The pulmonic valve was grossly normal. Pulmonic valve regurgitation is  trivial.  10. Normal pulmonary artery systolic pressure.  11. The inferior vena cava is normal in size with greater than 50%  respiratory variability, suggesting right atrial pressure of 3 mmHg.   EEG adult 01/13/2019 IMPRESSION: This study is suggestive of non specific  cortical dysfunction in the left temporal region. No seizures or definite epileptiform discharges were seen throughout the recording. However, only wakefulness and drowsiness were recorded. If suspicion for interictal activity remains a concern, a prolonged study including sleep can be considered.      ASSESSMENT: Donna Warren is a 69 y.o. year old female presented with transient speech difficulties and right hand paresthesias on 01/12/2019 likely focal seizures versus complicated migraine and initiated Depakote 500 mg ER daily.  EEG consistent with left temporal region dysfunction.  Vascular risk factors include HTN and HLD.  She has been doing well since discharge without reoccurring transient speech or right hand symptoms but does question possible side effect of Depakote including dizziness.  She however does have underlying history of Mnire's disease which could contribute to her dizziness.     PLAN:  I had a long discussion with the patient regarding her episodes of possible focal seizures versus complicated migraine which seems stable on the current dose of Depakote.  She has some concerns about dizziness but these seem to preexist starting Depakote and may be related to her vestibular dysfunction for many years.  I offered to taper and discontinue Depakote or change to an alternative agent but she wants to hold off for now and will think about it and let me know.  Continue Depakote ER 500 mg daily.  Return for follow-up in the future in 6 months or call earlier if necessaryeded Greater than 50% of time during this 45 minute visit was spent on counseling, explanation of diagnosis of focal seizure versus complicated migraine,  , discussion regarding possible side effects of Depakote, discussion regarding short-term memory concerns, planning of further management along with potential future management, and discussion with patient answered all questions to [place], [facility] [address] [place], [place] [address]  Phone [phone] Fax [phone] Note: This document was prepared with digital dictation and possible smart phrase technology. Any transcriptional errors that result from this process are unintentional.

## 2019-04-27 NOTE — Patient Instructions (Signed)
I had a long discussion with the patient regarding her episodes of possible focal seizures versus complicated migraine which seems stable on the current dose of Depakote.  She has some concerns about dizziness but these seem to preexist starting Depakote and may be related to her vestibular dysfunction for many years.  I offered to taper and discontinue Depakote or change to an alternative agent but she wants to hold off for now and will think about it and let me know.  Continue Depakote ER 500 mg daily.  Return for follow-up in the future in 6 months or call earlier if necessary

## 2019-04-29 ENCOUNTER — Telehealth: Payer: Self-pay | Admitting: Internal Medicine

## 2019-04-29 NOTE — Progress Notes (Signed)
  Chronic Care Management   Note  04/29/2019 Name: Taylani Pastorek MRN: [id number] DOB: 08/16/1950  Dasiyah Bulnes is a 69 y.o. year old female who is a primary care patient of Panosh, Standley Brooking, MD. I reached out to Windell Norfolk by phone today in response to a referral sent by Ms. Velicia Cephas's PCP, Panosh, Standley Brooking, MD.   Ms. Wildrick was given information about Chronic Care Management services today including:  1. CCM service includes personalized support from designated clinical staff supervised by her physician, including individualized plan of care and coordination with other care providers 2. 24/7 contact phone numbers for assistance for urgent and routine care needs. 3. Service will only be billed when office clinical staff spend 20 minutes or more in a month to coordinate care. 4. Only one practitioner may furnish and bill the service in a calendar month. 5. The patient may stop CCM services at any time (effective at the end of the month) by phone call to the office staff.   Patient agreed to services and verbal consent obtained.   Follow up plan:   Raynicia Dukes [employer] Scheduler

## 2019-04-29 NOTE — Progress Notes (Signed)
°  Chronic Care Management   Outreach Note  04/29/2019 Name: Josselyn Burst MRN: [id number] DOB: 01/05/1951  Referred by: Burnis Medin, MD Reason for referral : No chief complaint on file.   An unsuccessful telephone outreach was attempted today. The patient was referred to the pharmacist for assistance with care management and care coordination.   Follow Up Plan:   Raynicia Dukes [employer] Scheduler

## 2019-05-20 ENCOUNTER — Telehealth: Payer: Self-pay | Admitting: Internal Medicine

## 2019-05-20 NOTE — Chronic Care Management (AMB) (Signed)
°  Chronic Care Management   Note  05/20/2019 Name: Donna Warren MRN: [id number] DOB: [birth year]  Donna Warren is a 69 y.o. year old female who is a primary care patient of Panosh, Standley Brooking, MD. I reached out to Donna Warren by phone today in response to a referral sent by Donna Warren's PCP, Panosh, Standley Brooking, MD.   [name] was given information about Chronic Care Management services today including:  1. CCM service includes personalized support from designated clinical staff supervised by her physician, including individualized plan of care and coordination with other care providers 2. 24/7 contact phone numbers for assistance for urgent and routine care needs. 3. Service will only be billed when office clinical staff spend 20 minutes or more in a month to coordinate care. 4. Only one practitioner may furnish and bill the service in a calendar month. 5. The patient may stop CCM services at any time (effective at the end of the month) by phone call to the office staff.   Patient agreed to services and verbal consent obtained.   Follow up plan:   [place]

## 2019-05-25 ENCOUNTER — Other Ambulatory Visit: Payer: Self-pay

## 2019-05-25 ENCOUNTER — Telehealth: Payer: PPO

## 2019-05-25 NOTE — Progress Notes (Deleted)
This visit occurred during the SARS-CoV-2 public health emergency.  Safety protocols were in place, including screening questions prior to the visit, additional usage of staff PPE, and extensive cleaning of exam room while observing appropriate contact time as indicated for disinfecting solutions.    No chief complaint on file.   HPI: Donna Warren 69 y.o. come in for Chronic disease management  ROS: See pertinent positives and negatives per HPI.  Past Medical History:  Diagnosis Date  . 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 Arthritis   . Cataract    developing  . Femur fracture, right ([place]) 2003  . Fibroids   . Gilbert's syndrome   . History of liver biopsy nl    records of fatty liver 2008 not confirmed   . Hx of adenomatous colonic polyps   . Meniere disease    Right ear  . Osteoporosis   . Osteoporosis   . Other hemochromatosis    heterozygosity for h63d gene  . Spontaneous abortion     Family History  Problem Relation Age of Onset  . Colon cancer Father        dx'd late to mid 28's  . Other Father        tinnitus  . Heart attack Father   . Depression Mother   . Anxiety disorder Mother   . Dementia Mother   . Other Mother        high calcium  . Hearing loss Mother        death  . Pulmonary disease Mother   . Stroke Other        silent  . Other Brother        elevated LFTs  . Healthy Daughter   . Healthy Daughter   . Colon polyps Neg Hx   . Esophageal cancer Neg Hx   . Rectal cancer Neg Hx   . Stomach cancer Neg Hx   . Breast cancer Neg Hx     Social History   Socioeconomic History  . Marital status: Married    Spouse name: Not on file  . Number of children: Not on file  . Years of education: Not on file  . Highest education level: Not on file  Occupational History  . Not on file  Tobacco Use  . Smoking status: Never Smoker  . Smokeless tobacco: Never Used    Substance and Sexual Activity  . Alcohol use: Yes    Alcohol/week: 1.0 - 2.0 standard drinks    Types: 1 - 2 Glasses of wine per week    Comment: glass of wine a week  . Drug use: No  . Sexual activity: Not on file  Other Topics Concern  . Not on file  Social History Narrative        2 daughters   Husband a [place] director  Working 67 - 44.  Travels    Daily caffeine use   Sleep  Often disrupted.    Alcohol about once a week.   Hof 2 pet dogs   Social Determinants of Health   Financial Resource Strain:   . Difficulty of Paying Living Expenses:   Food Insecurity:   . Worried About Charity fundraiser in the Last Year:   . Arboriculturist in the Last Year:   Transportation Needs:   . Film/video editor (Medical):   Marland Kitchen  Lack of Transportation (Non-Medical):   Physical Activity:   . Days of Exercise per Week:   . Minutes of Exercise per Session:   Stress:   . Feeling of Stress :   Social Connections:   . Frequency of Communication with Friends and Family:   . Frequency of Social Gatherings with Friends and Family:   . Attends Religious Services:   . Active Member of Clubs or Organizations:   . Attends Archivist Meetings:   Marland Kitchen Marital Status:     Outpatient Medications Prior to Visit  Medication Sig Dispense Refill  . aspirin EC 81 MG tablet Take 81 mg by mouth daily.    . Calcium Carb-Cholecalciferol (CALCIUM 600/VITAMIN D3) 600-800 MG-UNIT TABS Take 1 tablet by mouth daily.    . chlorthalidone (HYGROTON) 25 MG tablet TAKE 1 TABLET BY MOUTH EVERY DAY (Patient taking differently: Take 25 mg by mouth daily. ) 90 tablet 0  . cholecalciferol (VITAMIN D3) 25 MCG (1000 UT) tablet Take 1,000 Units by mouth daily.    . citalopram (CELEXA) 10 MG tablet Take 1 tablet (10 mg total) by mouth daily. 90 tablet 1  . diazepam (VALIUM) 2 MG tablet Take 2 mg by mouth as needed (for Meniere's flare up).     . divalproex (DEPAKOTE ER) 500 MG  24 hr tablet Take 1 tablet (500 mg total) by mouth at bedtime. 30 tablet 3  . divalproex (DEPAKOTE ER) 500 MG 24 hr tablet Take by mouth.    Marland Kitchen LORazepam (ATIVAN) 0.5 MG tablet 1-2 po if needed for anxiety up to every 8 hours (Patient taking differently: Take 0.5-1 mg by mouth as needed (for meniere's flare up). ) 24 tablet 0  . ondansetron (ZOFRAN-ODT) 4 MG disintegrating tablet Take 4 mg by mouth every 8 (eight) hours as needed.   1  . TURMERIC PO Take by mouth daily.     Facility-Administered Medications Prior to Visit  Medication Dose Route Frequency Provider Last Rate Last Admin  . 0.9 %  sodium chloride infusion  500 mL Intravenous Once Gatha Mayer, MD         EXAM:  There were no vitals taken for this visit.  There is no height or weight on file to calculate BMI.  GENERAL: vitals reviewed and listed above, alert, oriented, appears well hydrated and in no acute distress HEENT: atraumatic, conjunctiva  clear, no obvious abnormalities on inspection of external nose and ears OP : no lesion edema or exudate  NECK: no obvious masses on inspection palpation  LUNGS: clear to auscultation bilaterally, no wheezes, rales or rhonchi, good air movement CV: HRRR, no clubbing cyanosis or  peripheral edema nl cap refill  MS: moves all extremities without noticeable focal  abnormality PSYCH: pleasant and cooperative, no obvious depression or anxiety Lab Results  Component Value Date   WBC 4.6 02/12/2019   HGB 13.0 02/12/2019   HCT 37.8 02/12/2019   PLT 185.0 02/12/2019   GLUCOSE 92 02/12/2019   CHOL 222 (H) 02/12/2019   TRIG 83.0 02/12/2019   HDL 63.30 02/12/2019   LDLDIRECT 129.1 02/16/2013   LDLCALC 142 (H) 02/12/2019   ALT 16 02/12/2019   AST 19 02/12/2019   NA 139 02/12/2019   K 3.7 02/12/2019   CL 97 02/12/2019   CREATININE 0.93 02/12/2019   BUN 24 (H) 02/12/2019   CO2 34 (H) 02/12/2019   TSH 1.41 02/12/2019   INR 1.0 01/12/2019   HGBA1C 5.2 01/13/2019   BP  Readings  from Last 3 Encounters:  04/27/19 108/72  02/24/19 118/60  01/13/19 96/86    ASSESSMENT AND PLAN:  Discussed the following assessment and plan:  No diagnosis found.  -Patient advised to return or notify health care team  if  new concerns arise.  There are no Patient Instructions on file for this visit.   Standley Brooking. Marin Wisner M.D.

## 2019-05-26 ENCOUNTER — Ambulatory Visit: Payer: PPO | Admitting: Internal Medicine

## 2019-06-03 DIAGNOSIS — H8101 Meniere's disease, right ear: Secondary | ICD-10-CM | POA: Diagnosis not present

## 2019-06-03 DIAGNOSIS — H903 Sensorineural hearing loss, bilateral: Secondary | ICD-10-CM | POA: Diagnosis not present

## 2019-06-03 DIAGNOSIS — Z880 Allergy status to penicillin: Secondary | ICD-10-CM | POA: Diagnosis not present

## 2019-06-03 DIAGNOSIS — Z79899 Other long term (current) drug therapy: Secondary | ICD-10-CM | POA: Diagnosis not present

## 2019-06-03 DIAGNOSIS — H9041 Sensorineural hearing loss, unilateral, right ear, with unrestricted hearing on the contralateral side: Secondary | ICD-10-CM | POA: Diagnosis not present

## 2019-06-03 DIAGNOSIS — H9311 Tinnitus, right ear: Secondary | ICD-10-CM | POA: Diagnosis not present

## 2019-06-08 ENCOUNTER — Other Ambulatory Visit: Payer: Self-pay

## 2019-06-08 NOTE — Progress Notes (Signed)
This visit occurred during the SARS-CoV-2 public health emergency.  Safety protocols were in place, including screening questions prior to the visit, additional usage of staff PPE, and extensive cleaning of exam room while observing appropriate contact time as indicated for disinfecting solutions.    Chief Complaint  Patient presents with  . Medication Management    Discuss blood work and cardiac questions    HPI: Donna Warren 69 y.o. come in for  Multiple issues    meniere  Taking meds  No new spells but losing  Hearing right ear  Despite  Steroid injections  Pos ana   And vestibular study  Had labs done  [facility]  Seizures and dizziness in December   depakote   Nl eeg  But should stay on for  While  No attacks and ok so far.  X hearing loss right    pred shots.   Hx of restored in past .   May have had polio as a young child  ? Braces  Mom had had polio  mgf died of suicide  Now asks about cholesterol control   No hx cv event ( since no tia)   lipids were high in December  Has had side side pains soreness   Strange sx but no sob   shooting   Things   Around chest   Echo card  And heart monitor and nl in December.    No exercise  Induced sx   Asking about nutrition.  To help lipids and advisability of medication intervention  Neg fam hx early  Or vascular disease .  Finger  r ring  Bump not tender and no dysfunction Left great toenail with dark area for about a month but doesn't  Remember injury and no tenderness    ROS: See pertinent positives and negatives per HPI. Father had heart attacks  dammage silent   And mom was a smoker  Vascular demtentia .  Past Medical History:  Diagnosis Date  . 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 Arthritis   . Cataract    developing  . Femur fracture, right ([place]) 2003  . Fibroids   . Gilbert's syndrome   . History of liver biopsy nl    records of  fatty liver 2008 not confirmed   . Hx of adenomatous colonic polyps   . Meniere disease    Right ear  . Osteoporosis   . Osteoporosis   . Other hemochromatosis    heterozygosity for h63d gene  . Spontaneous abortion     Family History  Problem Relation Age of Onset  . Colon cancer Father        dx'd late to mid 25's  . Other Father        tinnitus  . Heart attack Father   . Depression Mother   . Anxiety disorder Mother   . Dementia Mother   . Other Mother        high calcium  . Hearing loss Mother        death  . Pulmonary disease Mother   . Stroke Other        silent  . Other Brother        elevated LFTs  . Healthy Daughter   . Healthy Daughter   . Colon polyps Neg Hx   . Esophageal cancer Neg Hx   . Rectal cancer Neg Hx   .  Stomach cancer Neg Hx   . Breast cancer Neg Hx     Social History   Socioeconomic History  . Marital status: Married    Spouse name: Not on file  . Number of children: Not on file  . Years of education: Not on file  . Highest education level: Not on file  Occupational History  . Not on file  Tobacco Use  . Smoking status: Never Smoker  . Smokeless tobacco: Never Used  Substance and Sexual Activity  . Alcohol use: Yes    Alcohol/week: 1.0 - 2.0 standard drinks    Types: 1 - 2 Glasses of wine per week    Comment: glass of wine a week  . Drug use: No  . Sexual activity: Not on file  Other Topics Concern  . Not on file  Social History Narrative        2 daughters   Husband a [place] director  Working 69 - 67.  Travels    Daily caffeine use   Sleep  Often disrupted.    Alcohol about once a week.   Hof 2 pet dogs   Social Determinants of Health   Financial Resource Strain:   . Difficulty of Paying Living Expenses:   Food Insecurity:   . Worried About Charity fundraiser in the Last Year:   . Arboriculturist in the Last Year:   Transportation Needs:   . Film/video editor (Medical):   Marland Kitchen  Lack of Transportation (Non-Medical):   Physical Activity:   . Days of Exercise per Week:   . Minutes of Exercise per Session:   Stress:   . Feeling of Stress :   Social Connections:   . Frequency of Communication with Friends and Family:   . Frequency of Social Gatherings with Friends and Family:   . Attends Religious Services:   . Active Member of Clubs or Organizations:   . Attends Archivist Meetings:   Marland Kitchen Marital Status:     Outpatient Medications Prior to Visit  Medication Sig Dispense Refill  . aspirin EC 81 MG tablet Take 81 mg by mouth daily.    . Calcium Carb-Cholecalciferol (CALCIUM 600/VITAMIN D3) 600-800 MG-UNIT TABS Take 1 tablet by mouth daily.    . chlorthalidone (HYGROTON) 25 MG tablet TAKE 1 TABLET BY MOUTH EVERY DAY (Patient taking differently: Take 25 mg by mouth daily. ) 90 tablet 0  . cholecalciferol (VITAMIN D3) 25 MCG (1000 UT) tablet Take 1,000 Units by mouth daily.    . citalopram (CELEXA) 10 MG tablet Take 1 tablet (10 mg total) by mouth daily. 90 tablet 1  . diazepam (VALIUM) 2 MG tablet Take 2 mg by mouth as needed (for Meniere's flare up).     . divalproex (DEPAKOTE ER) 500 MG 24 hr tablet Take 1 tablet (500 mg total) by mouth at bedtime. 30 tablet 3  . LORazepam (ATIVAN) 0.5 MG tablet 1-2 po if needed for anxiety up to every 8 hours (Patient taking differently: Take 0.5-1 mg by mouth as needed (for meniere's flare up). ) 24 tablet 0  . ondansetron (ZOFRAN-ODT) 4 MG disintegrating tablet Take 4 mg by mouth every 8 (eight) hours as needed.   1  . TURMERIC PO Take by mouth daily.    . divalproex (DEPAKOTE ER) 500 MG 24 hr tablet Take by mouth.     Facility-Administered Medications Prior to Visit  Medication Dose Route Frequency  Provider Last Rate Last Admin  . 0.9 %  sodium chloride infusion  500 mL Intravenous Once Gatha Mayer, MD         EXAM:  BP 114/70   Pulse 64   Temp 97.6 F (36.4 C) (Temporal)   Ht 5\' 4"  (1.626 m)   Wt 121 lb  3.2 oz (55 kg)   SpO2 96%   BMI 20.80 kg/m   Body mass index is 20.8 kg/m.  GENERAL: vitals reviewed and listed above, alert, oriented, appears well hydrated and in no acute distress HEENT: atraumatic, conjunctiva  clear, no obvious abnormalities on inspection of external nose and ears OP : masked NECK: no obvious masses on inspection palpation  LUNGS: clear to auscultation bilaterally, no wheezes, rales or rhonchi, good air movement CV: HRRR, no clubbing cyanosis or  peripheral edema nl cap refill  Abdomen:  Sof,t normal bowel sounds without hepatosplenomegaly, no guarding rebound or masses no CVA tenderness MS: moves all extremities right ring finger pip palmar area small purplish bump feels like cyst  Left great toenail with materal  Dark  5 -7 mm spot  PSYCH: pleasant and cooperative, no obvious depression or anxiety Lab Results  Component Value Date   WBC 4.6 02/12/2019   HGB 13.0 02/12/2019   HCT 37.8 02/12/2019   PLT 185.0 02/12/2019   GLUCOSE 92 02/12/2019   CHOL 222 (H) 02/12/2019   TRIG 83.0 02/12/2019   HDL 63.30 02/12/2019   LDLDIRECT 129.1 02/16/2013   LDLCALC 142 (H) 02/12/2019   ALT 16 02/12/2019   AST 19 02/12/2019   NA 139 02/12/2019   K 3.7 02/12/2019   CL 97 02/12/2019   CREATININE 0.93 02/12/2019   BUN 24 (H) 02/12/2019   CO2 34 (H) 02/12/2019   TSH 1.41 02/12/2019   INR 1.0 01/12/2019   HGBA1C 5.2 01/13/2019   BP Readings from Last 3 Encounters:  06/09/19 114/70  04/27/19 108/72  02/24/19 118/60  The 10-year ASCVD risk score [name] DC Jr., et al., 2013) is: 8.2%   Values used to calculate the score:     Age: 21 years     Sex: Female     Is Non-Hispanic African American: No     Diabetic: No     Tobacco smoker: No     Systolic Blood Pressure: 99991111 mmHg     Is BP treated: Yes     HDL Cholesterol: 63.3 mg/dL     Total Cholesterol: 222 mg/dL  She doesn't have  Ht  But is on meds used for ht so risk is probably lower  ASSESSMENT AND  PLAN:  Discussed the following assessment and plan:  Elevated cholesterol - curious sig elevation december   down disc risk assessment - Plan: Lipid panel, Hepatic function panel  Elevated LFTs - has beens table  follow - Plan: Lipid panel, Hepatic function panel  Nail discoloration - see derm   Positive ANA (antinuclear antibody) Last pap 2020 yearly pelvic pap every 3 year until 70 Disc options  CAC  scan ,card risk assessment , Shared Decision Making Intensify lifestyle interventions. Since feeling better and recheck labs in July august . -Patient advised to return or notify health care team  if  new concerns arise.  Patient Instructions  Consider  Repeat lipids  Liver . and consider  coronary artery scan .  For  Risk assessment. See dermatologist about the left great toe   Area .  Lab appt in August or thereabouts .  Coronary Calcium Scan A coronary calcium scan is an imaging test used to look for deposits of plaque in the inner lining of the blood vessels of the heart (coronary arteries). Plaque is made up of calcium, protein, and fatty substances. These deposits of plaque can partly clog and narrow the coronary arteries without producing any symptoms or warning signs. This puts a person at risk for a heart attack. This test is recommended for people who are at moderate risk for heart disease. The test can find plaque deposits before symptoms develop. Tell a health care provider about:  Any allergies you have.  All medicines you are taking, including vitamins, herbs, eye drops, creams, and over-the-counter medicines.  Any problems you or family members have had with anesthetic medicines.  Any blood disorders you have.  Any surgeries you have had.  Any medical conditions you have.  Whether you are pregnant or may be pregnant. What are the risks? Generally, this is a safe procedure. However, problems may occur, including:  Harm to a pregnant woman and her unborn baby.  This test involves the use of radiation. Radiation exposure can be dangerous to a pregnant woman and her unborn baby. If you are pregnant or think you may be pregnant, you should not have this procedure done.  Slight increase in the risk of cancer. This is because of the radiation involved in the test. What happens before the procedure? Ask your health care provider for any specific instructions on how to prepare for this procedure. You may be asked to avoid products that contain caffeine, tobacco, or nicotine for 4 hours before the procedure. What happens during the procedure?   You will undress and remove any jewelry from your neck or chest.  You will put on a hospital gown.  Sticky electrodes will be placed on your chest. The electrodes will be connected to an electrocardiogram (ECG) machine to record a tracing of the electrical activity of your heart.  You will lie down on a curved bed that is attached to the [place].  You may be given medicine to slow down your heart rate so that clear pictures can be created.  You will be moved into the CT scanner, and the CT scanner will take pictures of your heart. During this time, you will be asked to lie still and hold your breath for 2-3 seconds at a time while each picture of your heart is being taken. The procedure may vary among health care providers and hospitals. What happens after the procedure?  You can get dressed.  You can return to your normal activities.  It is up to you to get the results of your procedure. Ask your health care provider, or the department that is doing the procedure, when your results will be ready. Summary  A coronary calcium scan is an imaging test used to look for deposits of plaque in the inner lining of the blood vessels of the heart (coronary arteries). Plaque is made up of calcium, protein, and fatty substances.  Generally, this is a safe procedure. Tell your health care provider if you are pregnant or  may be pregnant.  Ask your health care provider for any specific instructions on how to prepare for this procedure.  A CT scanner will take pictures of your heart.  You can return to your normal activities after the scan is done. This information is not intended to replace advice given to you by your health care provider. Make sure you discuss  any questions you have with your health care provider. Document Revised: 07/28/2018 Document Reviewed: 07/28/2018 Elsevier Patient Education  2020 [place] Myangel Summons M.D. Neuro plan   PLAN:  1. Focal seizure versus migraine: Due to possible side effect of Depakote, recommend trialing medication prior to bed as she is currently taking in the morning.  Advised to continue for 1 week and to call office if symptoms persist.  We will repeat EEG as recommended during hospitalization.  Also highly encouraged scheduling follow-up visit with establish otolaryngology provider for follow-up regarding Mnire's disease.  Continue aspirin 81 mg daily   for stroke prevention. Maintain strict control of hypertension with blood pressure goal below 130/90, diabetes with hemoglobin A1c goal below 6.5% and cholesterol with LDL cholesterol (bad cholesterol) goal below 70 mg/dL.  I also advised the patient to eat a healthy diet with plenty of whole grains, cereals, fruits and vegetables, exercise regularly with at least 30 minutes of continuous activity daily and maintain ideal body weight. 2. HTN: Advised to continue current treatment regimen.  Today's BP stable.  Advised to continue to monitor at home along with continued follow-up with PCP for management 3. HLD: Follow-up with PCP for monitoring and management  4. Short term memory concerns: discussion regarding likely due to increased stressors with possible underlying depression and anxiety.  Discussion regarding importance of stress reduction techniques, ensuring adequate exercise, and healthy  eating.  Advised patient with additional education regarding short-term memory complaints related to stress.  Advised to continue to monitor and if worsens, to discuss this further with PCP who can evaluate further.    Follow up in 4 months or call earlier if needed The 10-year ASCVD risk score [name] DC Brooke Bonito., et al., 2013) is: 8.2%   Values used to calculate the score:     Age: 31 years     Sex: Female     Is Non-Hispanic African American: No     Diabetic: No     Tobacco smoker: No     Systolic Blood Pressure: 99991111 mmHg     Is BP treated: Yes     HDL Cholesterol: 63.3 mg/dL     Total Cholesterol: 222 mg/dL

## 2019-06-09 ENCOUNTER — Encounter: Payer: Self-pay | Admitting: Internal Medicine

## 2019-06-09 ENCOUNTER — Ambulatory Visit (INDEPENDENT_AMBULATORY_CARE_PROVIDER_SITE_OTHER): Payer: PPO | Admitting: Internal Medicine

## 2019-06-09 ENCOUNTER — Other Ambulatory Visit: Payer: Self-pay

## 2019-06-09 VITALS — BP 114/70 | HR 64 | Temp 97.6°F | Ht 64.0 in | Wt 121.2 lb

## 2019-06-09 DIAGNOSIS — L608 Other nail disorders: Secondary | ICD-10-CM | POA: Diagnosis not present

## 2019-06-09 DIAGNOSIS — R768 Other specified abnormal immunological findings in serum: Secondary | ICD-10-CM | POA: Diagnosis not present

## 2019-06-09 DIAGNOSIS — R7989 Other specified abnormal findings of blood chemistry: Secondary | ICD-10-CM | POA: Diagnosis not present

## 2019-06-09 DIAGNOSIS — E7800 Pure hypercholesterolemia, unspecified: Secondary | ICD-10-CM | POA: Diagnosis not present

## 2019-06-09 NOTE — Patient Instructions (Addendum)
Consider  Repeat lipids  Liver . and consider  coronary artery scan .  For  Risk assessment. See dermatologist about the left great toe   Area .  Lab appt in August or thereabouts .    Coronary Calcium Scan A coronary calcium scan is an imaging test used to look for deposits of plaque in the inner lining of the blood vessels of the heart (coronary arteries). Plaque is made up of calcium, protein, and fatty substances. These deposits of plaque can partly clog and narrow the coronary arteries without producing any symptoms or warning signs. This puts a person at risk for a heart attack. This test is recommended for people who are at moderate risk for heart disease. The test can find plaque deposits before symptoms develop. Tell a health care provider about:  Any allergies you have.  All medicines you are taking, including vitamins, herbs, eye drops, creams, and over-the-counter medicines.  Any problems you or family members have had with anesthetic medicines.  Any blood disorders you have.  Any surgeries you have had.  Any medical conditions you have.  Whether you are pregnant or may be pregnant. What are the risks? Generally, this is a safe procedure. However, problems may occur, including:  Harm to a pregnant woman and her unborn baby. This test involves the use of radiation. Radiation exposure can be dangerous to a pregnant woman and her unborn baby. If you are pregnant or think you may be pregnant, you should not have this procedure done.  Slight increase in the risk of cancer. This is because of the radiation involved in the test. What happens before the procedure? Ask your health care provider for any specific instructions on how to prepare for this procedure. You may be asked to avoid products that contain caffeine, tobacco, or nicotine for 4 hours before the procedure. What happens during the procedure?   You will undress and remove any jewelry from your neck or chest.  You  will put on a hospital gown.  Sticky electrodes will be placed on your chest. The electrodes will be connected to an electrocardiogram (ECG) machine to record a tracing of the electrical activity of your heart.  You will lie down on a curved bed that is attached to the [place].  You may be given medicine to slow down your heart rate so that clear pictures can be created.  You will be moved into the CT scanner, and the CT scanner will take pictures of your heart. During this time, you will be asked to lie still and hold your breath for 2-3 seconds at a time while each picture of your heart is being taken. The procedure may vary among health care providers and hospitals. What happens after the procedure?  You can get dressed.  You can return to your normal activities.  It is up to you to get the results of your procedure. Ask your health care provider, or the department that is doing the procedure, when your results will be ready. Summary  A coronary calcium scan is an imaging test used to look for deposits of plaque in the inner lining of the blood vessels of the heart (coronary arteries). Plaque is made up of calcium, protein, and fatty substances.  Generally, this is a safe procedure. Tell your health care provider if you are pregnant or may be pregnant.  Ask your health care provider for any specific instructions on how to prepare for this procedure.  A CT  scanner will take pictures of your heart.  You can return to your normal activities after the scan is done. This information is not intended to replace advice given to you by your health care provider. Make sure you discuss any questions you have with your health care provider. Document Revised: 07/28/2018 Document Reviewed: 07/28/2018 Elsevier Patient Education  [place].

## 2019-06-11 ENCOUNTER — Other Ambulatory Visit: Payer: Self-pay

## 2019-06-11 DIAGNOSIS — H903 Sensorineural hearing loss, bilateral: Secondary | ICD-10-CM | POA: Diagnosis not present

## 2019-06-11 DIAGNOSIS — Z7952 Long term (current) use of systemic steroids: Secondary | ICD-10-CM | POA: Diagnosis not present

## 2019-06-11 DIAGNOSIS — H9311 Tinnitus, right ear: Secondary | ICD-10-CM | POA: Diagnosis not present

## 2019-06-11 DIAGNOSIS — H9041 Sensorineural hearing loss, unilateral, right ear, with unrestricted hearing on the contralateral side: Secondary | ICD-10-CM | POA: Diagnosis not present

## 2019-06-11 DIAGNOSIS — H8101 Meniere's disease, right ear: Secondary | ICD-10-CM | POA: Diagnosis not present

## 2019-06-11 DIAGNOSIS — Z880 Allergy status to penicillin: Secondary | ICD-10-CM | POA: Diagnosis not present

## 2019-06-11 DIAGNOSIS — I10 Essential (primary) hypertension: Secondary | ICD-10-CM

## 2019-06-11 DIAGNOSIS — M810 Age-related osteoporosis without current pathological fracture: Secondary | ICD-10-CM

## 2019-06-11 DIAGNOSIS — Z79899 Other long term (current) drug therapy: Secondary | ICD-10-CM | POA: Diagnosis not present

## 2019-06-14 ENCOUNTER — Other Ambulatory Visit: Payer: Self-pay | Admitting: Internal Medicine

## 2019-06-14 ENCOUNTER — Other Ambulatory Visit: Payer: Self-pay | Admitting: Adult Health

## 2019-06-15 ENCOUNTER — Other Ambulatory Visit: Payer: Self-pay

## 2019-06-15 ENCOUNTER — Ambulatory Visit: Payer: PPO

## 2019-06-15 DIAGNOSIS — M810 Age-related osteoporosis without current pathological fracture: Secondary | ICD-10-CM

## 2019-06-15 DIAGNOSIS — H8109 Meniere's disease, unspecified ear: Secondary | ICD-10-CM

## 2019-06-15 DIAGNOSIS — E785 Hyperlipidemia, unspecified: Secondary | ICD-10-CM

## 2019-06-15 DIAGNOSIS — G40909 Epilepsy, unspecified, not intractable, without status epilepticus: Secondary | ICD-10-CM

## 2019-06-15 NOTE — Patient Instructions (Addendum)
Visit Information  Goals Addressed            This Visit's Progress   . Pharmacy Care Plan       CARE PLAN ENTRY  Current Barriers:  . Chronic Disease Management support, education, and care coordination needs related to Hyperlipidemia, Osteoporosis, and seizure disorder, Meniere's disease   Meniere's disease  . Pharmacist Clinical Goal(s): o Over the next 180 days, patient will work with PharmD and providers to maintain minimum symptoms related to Meniere's.  . Current regimen:   Chlorthalidone 25mg , 1 tablet once daily   Lorazepam 0.5mg , take 1 to 2 tablet as needed for meniere's flare up   Diazepam (Valium) 2mg  as needed for meniere's flare up   Ondansetron (Zofran ODT) 4mg , 1 tablet every eight hours as needed  . Patient self care activities - Over the next 180 days, patient will: o Continue current medications and follow up with Dr. Thornell Mule.   Hyperlipidemia . Pharmacist Clinical Goal(s): o Over the next 180 days, patient will work with PharmD and providers to achieve LDL goal recommended by primary care provider.  . Current regimen:  o Lifestyle modifications  . Interventions: . A diet high in plant sterols (fruits/vegetables/nuts/whole grains/legumes) may reduce your cholesterol.  Encouraged increasing fiber to a daily intake of 10-25g/day  . Patient self care activities - Over the next 180 days, patient will: o Working on lifestyle modifications (diet) and obtain repeat blood work in July.   Osteoporosis . Pharmacist Clinical Goal(s): o Over the next 180 days, patient will work with PharmD and providers to optimize calcium and vitamin D therapy.  . Current regimen:   Calcium carbonate- cholecalciferol 600mg -800 units, 1 tablet daily   Cholecalciferol (vitamin D3) 1000 (29mcg) units, 1 tablet once daily  . Interventions: o Recommend [phone] units of vitamin D daily.  o Recommend 1200 mg of calcium daily from dietary and supplemental sources . Patient self care  activities - Over the next 180 days, patient will: o Continue current medications and follow up as needed for additional therapy as indicated.  Seizure . Pharmacist Clinical Goal(s) o Over the next 180 days, patient will work with PharmD and providers to prevent occurrence of seizure.  . Current regimen:  o Divalproex (Depakote ER) 500mg , 1 tablet at bedtime  . Interventions: o Discussed routine monitoring of valproic acid levels.  . Patient self care activities - Over the next 180 days, patient will: o Keep follow up visit with Dr. Leonie Man.   Medication management . Pharmacist Clinical Goal(s): o Over the next 180 days, patient will work with PharmD and providers to maintain optimal medication adherence . [facility]  . Interventions o Comprehensive medication review performed. o Continue current medication management strategy . Patient self care activities - Over the next 180 days, patient will: o Take medications as prescribed o Report any questions or concerns to PharmD and/or provider(s)  Initial goal documentation        Donna Warren was given information about Chronic Care Management services today including:  1. CCM service includes personalized support from designated clinical staff supervised by her physician, including individualized plan of care and coordination with other care providers 2. 24/7 contact phone numbers for assistance for urgent and routine care needs. 3. Standard insurance, coinsurance, copays and deductibles apply for chronic care management only during months in which we provide at least 20 minutes of these services. Most insurances cover these services at 100%, however patients may be responsible for  any copay, coinsurance and/or deductible if applicable. This service may help you avoid the need for more expensive face-to-face services. 4. Only one practitioner may furnish and bill the service in a calendar month. 5. The patient may stop CCM  services at any time (effective at the end of the month) by phone call to the office staff.  Patient agreed to services and verbal consent obtained.   The patient verbalized understanding of instructions provided today and agreed to receive a mailed copy of patient instruction and/or educational materials. Telephone follow up appointment with pharmacy team member scheduled for: 10/15/2019  Anson Crofts, PharmD Clinical Pharmacist [facility] [place] [phone]    Eating Plan for Osteoporosis Osteoporosis causes your bones to become weak and brittle. This puts you at greater risk for bone breaks (fractures) from small bumps or falls. Making changes to your diet and increasing your physical activity can help strengthen your bones and improve your overall health. Calcium and vitamin D are nutrients that play an important role in bone health. Vitamin D helps your body use calcium and strengthen bones. Therefore, it is important to get enough calcium and vitamin D as part of your eating plan for osteoporosis. What are tips for following this plan? Reading food labels  Try to get at least 1,000 milligrams (mg) of calcium each day.  Look for foods that have at least 50 mg of calcium per serving.  Talk with your health care provider about taking a calcium supplement if you do not get enough calcium from food.  Do not have more than 2,500 mg of calcium each day. This is the upper limit for food and nutritional supplements combined. Too much calcium may cause constipation and prevent you from absorbing other important nutrients.  Choose foods that contain vitamin D.  Take a daily vitamin supplement that contains 800-1,000 international units (IU) of vitamin D. The amount may be different depending on your age, body weight, ethnicity, and where you live. Talk with your dietitian or health care provider about how much vitamin D is right for you.  Avoid foods that have  more than 300 mg of sodium per serving. Too much sodium can cause your body to lose calcium.  Talk with your dietitian or health care provider about how much sodium you are allowed each day. Shopping  Do not buy foods with added salt, including: ? Salted snacks. ? Angie Fava. ? Canned soups. ? Canned meats. ? Processed meats, such as bacon or cold cuts. ? Smoked fish. Meal planning  Eat balanced meals that contain protein foods, fruits and vegetables, and foods rich in calcium and vitamin D.  Eat at least 5 servings of fruits and vegetables each day.  Eat 5-6 oz. of lean meat, poultry, fish, eggs, or beans each day. Lifestyle  Do not use any products that contain nicotine or tobacco, such as cigarettes and e-cigarettes. If you need help quitting, ask your health care provider.  If your health care provider recommends that you lose weight: ? Work with a dietitian to develop an eating plan that will help you reach your desired weight goal. ? Exercise for at least 30 minutes a day, 5 or more days a week, or as told by your health care provider.  Work with a physical therapist to develop an exercise plan that includes flexibility, balance, and strength exercises.  If you drink alcohol, limit how much you have. This means: ? 0-1 drink a day for women. ?  0-2 drinks a day for men. ? Be aware of how much alcohol is in your drink. In the [place] one drink equals one typical bottle of beer (12 oz), one-half glass of wine (5 oz), or one shot of hard liquor (1 oz). What foods should I eat? Foods high in calcium   Yogurt. Yogurt with fruit.  Milk. Evaporated skim milk. Dry milk powder.  Calcium-fortified orange juice.  Parmesan cheese. Part-skim ricotta cheese. Natural hard cheese. Cream cheese. Cottage cheese.  Canned sardines. Canned salmon.  Calcium-treated tofu. Calcium-fortified cereal bar. Calcium-fortified cereal. Calcium-fortified graham crackers.  Cooked collard greens.  Turnip greens. Broccoli. Kale.  Almonds.  White beans.  Corn tortilla. Foods high in vitamin D  Cod liver oil. Fatty fish, such as tuna, mackerel, and salmon.  Milk. Fortified soy milk. Fortified fruit juice.  Yogurt. Margarine.  Egg yolks. Foods high in protein  Beef. Lamb. Pork tenderloin.  Chicken breast.  Tuna (canned). Fish fillet.  Tofu.  Soy beans (cooked). Soy patty. Beans (canned or cooked).  Cottage cheese.  Yogurt.  Peanut butter.  Pumpkin seeds. Nuts. Sunflower seeds.  Hard cheese.  Milk or other milk products, such as soy milk. The items listed above may not be a complete list of foods and beverages you can eat. Contact a dietitian for more options. Summary  Calcium and vitamin D are nutrients that play an important role in bone health and are an important part of your eating plan for osteoporosis.  Eat balanced meals that contain protein foods, fruits and vegetables, and foods rich in calcium and vitamin D.  Avoid foods that have more than 300 mg of sodium per serving. Too much sodium can cause your body to lose calcium.  Exercise is an important part of prevention and treatment of osteoporosis. Aim for at least 30 minutes a day, 5 days a week. This information is not intended to replace advice given to you by your health care provider. Make sure you discuss any questions you have with your health care provider. Document Revised: 03/17/2017 Document Reviewed: 03/17/2017 Elsevier Patient Education  2020 [employer].

## 2019-06-15 NOTE — Chronic Care Management (AMB) (Signed)
Chronic Care Management Pharmacy  Name: Sandi Mieles  MRN: [id number] DOB: 1950/05/31  Initial Questions: 1. Have you seen any other providers since your last visit? NA 2. Any changes in your medicines or health? No   Chief Complaint/ HPI  Donna Warren,  69 y.o. , female presents for their Initial CCM visit with the clinical pharmacist via telephone due to COVID-19 Pandemic.   PCP : Burnis Medin, MD  Their chronic conditions include: Meniere's disease, HLD, osteoporosis, seizure, anxiety, osteoarthritis  Office Visits: 06/09/19- Shanon Ace, MD- Patient presented for office visit. Patient to obtain repeat lipid and liver panel. Consider coronary calcium scan for risk assessment. Patient to see dermatology for left great toe nail discoloration.   Consult Visit: 06/11/2019- Otolaryngology- Jeneen Rinks- Patient presented for office visit for  Follow up for meniere's disease, s/p intratympanic dexamethasone and further decrease of hearing in her right ear. Patient received 2nd dose of dexamethasone. Patient to continue Dyazide 37.5mg  daily, low salt diet. Patient to return in 1 week for follow up with an audiogam. Patient referred to audiology for a hearing aid evaluation and cochlear implant testing.   06/03/2019- Otolaryngology- Jeneen Rinks- Patient presented for office visit for  Follow up for meniere's disease, s/p intratympanic dexamethasone. Patient reported improvement in hearing in right ear. Patient to continue current medications and follow up in 1 week for audiogram.   04/27/2019- Neurology- Antony Contras, MD- Patient presented for office visit for follow up regarding likely focal seizures. Patient inquired on Depakote and causing dizziness. Depakote taper and discontinuation was considered, but patient preferred to hold off and think about it. Patient to continue Depakote ER 500mg  daily and follow up in 6 months or earlier if needed.   04/01/2019- Audiology- Bess Kinds- patient presented for office visit. Patient instructed in exercises known to improve the vestibular ocular response (VOR). Patient to follow up with Dr. Leonie Man and Dr. Thornell Mule for additional testing and treatment as indicated.   Medications: Outpatient Encounter Medications as of 06/15/2019  Medication Sig  . aspirin EC 81 MG tablet Take 81 mg by mouth daily.  . Calcium Carb-Cholecalciferol (CALCIUM 600/VITAMIN D3) 600-800 MG-UNIT TABS Take 1 tablet by mouth daily.  . chlorthalidone (HYGROTON) 25 MG tablet TAKE 1 TABLET BY MOUTH EVERY DAY  . cholecalciferol (VITAMIN D3) 25 MCG (1000 UT) tablet Take 1,000 Units by mouth daily.  . citalopram (CELEXA) 10 MG tablet TAKE 1 TABLET BY MOUTH EVERY DAY  . divalproex (DEPAKOTE ER) 500 MG 24 hr tablet TAKE 1 TABLET (500 MG TOTAL) BY MOUTH AT BEDTIME.  . Turmeric 500 MG CAPS Take 1 capsule by mouth daily.   . diazepam (VALIUM) 2 MG tablet Take 2 mg by mouth as needed (for Meniere's flare up).   . divalproex (DEPAKOTE ER) 500 MG 24 hr tablet Take by mouth.  Marland Kitchen LORazepam (ATIVAN) 0.5 MG tablet 1-2 po if needed for anxiety up to every 8 hours (Patient not taking: Reported on 06/15/2019)  . ondansetron (ZOFRAN-ODT) 4 MG disintegrating tablet Take 4 mg by mouth every 8 (eight) hours as needed.   . [DISCONTINUED] divalproex (DEPAKOTE ER) 500 MG 24 hr tablet Take 1 tablet (500 mg total) by mouth at bedtime.   Facility-Administered Encounter Medications as of 06/15/2019  Medication  . 0.9 %  sodium chloride infusion     Current Diagnosis/Assessment:  Goals Addressed            This Visit's Progress   . Pharmacy Care  Plan       CARE PLAN ENTRY  Current Barriers:  . Chronic Disease Management support, education, and care coordination needs related to Hyperlipidemia, Osteoporosis, and seizure disorder, Meniere's disease   Meniere's disease  . Pharmacist Clinical Goal(s): o Over the next 180 days, patient will work with PharmD and providers to  maintain minimum symptoms related to Meniere's.  . Current regimen:   Chlorthalidone 25mg , 1 tablet once daily   Lorazepam 0.5mg , take 1 to 2 tablet as needed for meniere's flare up   Diazepam (Valium) 2mg  as needed for meniere's flare up   Ondansetron (Zofran ODT) 4mg , 1 tablet every eight hours as needed  . Patient self care activities - Over the next 180 days, patient will: o Continue current medications and follow up with Dr. Thornell Mule.   Hyperlipidemia . Pharmacist Clinical Goal(s): o Over the next 180 days, patient will work with PharmD and providers to achieve LDL goal recommended by primary care provider.  . Current regimen:  o Lifestyle modifications  . Interventions: . A diet high in plant sterols (fruits/vegetables/nuts/whole grains/legumes) may reduce your cholesterol.  Encouraged increasing fiber to a daily intake of 10-25g/day  . Patient self care activities - Over the next 180 days, patient will: o Working on lifestyle modifications (diet) and obtain repeat blood work in July.   Osteoporosis . Pharmacist Clinical Goal(s): o Over the next 180 days, patient will work with PharmD and providers to optimize calcium and vitamin D therapy.  . Current regimen:   Calcium carbonate- cholecalciferol 600mg -800 units, 1 tablet daily   Cholecalciferol (vitamin D3) 1000 (40mcg) units, 1 tablet once daily  . Interventions: o Recommend [phone] units of vitamin D daily.  o Recommend 1200 mg of calcium daily from dietary and supplemental sources . Patient self care activities - Over the next 180 days, patient will: o Continue current medications and follow up as needed for additional therapy as indicated.  Seizure . Pharmacist Clinical Goal(s) o Over the next 180 days, patient will work with PharmD and providers to prevent occurrence of seizure.  . Current regimen:  o Divalproex (Depakote ER) 500mg , 1 tablet at bedtime  . Interventions: o Discussed routine monitoring of valproic  acid levels.  . Patient self care activities - Over the next 180 days, patient will: o Keep follow up visit with Dr. Leonie Man.   Medication management . Pharmacist Clinical Goal(s): o Over the next 180 days, patient will work with PharmD and providers to maintain optimal medication adherence . [facility]  . Interventions o Comprehensive medication review performed. o Continue current medication management strategy . Patient self care activities - Over the next 180 days, patient will: o Take medications as prescribed o Report any questions or concerns to PharmD and/or provider(s)  Initial goal documentation       SDOH Interventions     Most Recent Value  SDOH Interventions  Financial Strain Interventions  Intervention Not Indicated  Transportation Interventions  Intervention Not Indicated       Meniere's disease   Patient reports only using benzos to make her go to sleep when it is too much. Patient reports not taking diazepam and lorazepam together, but states diazepam works better for her. States she has not needed to take prn meds for a "long time"  Denies HTN diagnosis.   Office blood pressures are  BP Readings from Last 3 Encounters:  06/09/19 114/70  04/27/19 108/72  02/24/19 118/60   Patient is controlled on:  Chlorthalidone 25mg , 1 tablet once daily   Lorazepam 0.5mg , take 1 to 2 tablet as needed for meniere's flare up   Diazepam (Valium) 2mg  as needed for meniere's flare up   Ondansetron (Zofran ODT) 4mg , 1 tablet every eight hours as needed   Underwent dexamethasone injections due to reported hearing loss.   Plan Managed by Dr. Thornell Mule. Has close follow up this.  Continue current medications   Hyperlipidemia  Patient reported discussing lipid  Panel with Dr. Regis Bill last week and was on the fence regarding to  risk factors. Patient not thrilled to start another medication. Patient states plan to go in July to have blood work done again and  reassess.   Lipid Panel     Component Value Date/Time   CHOL 222 (H) 02/12/2019 1025   TRIG 83.0 02/12/2019 1025   HDL 63.30 02/12/2019 1025   CHOLHDL 4 02/12/2019 1025   VLDL 16.6 02/12/2019 1025   LDLCALC 142 (H) 02/12/2019 1025   LDLDIRECT 129.1 02/16/2013 0858     The 10-year ASCVD risk score [name] DC Jr., et al., 2013) is: 5.6%   Values used to calculate the score:     Age: 69 years     Sex: Female     Is Non-Hispanic African American: No     Diabetic: No     Tobacco smoker: No     Systolic Blood Pressure: 94 mmHg     Is BP treated: Yes     HDL Cholesterol: 63.3 mg/dL     Total Cholesterol: 222 mg/dL   Patient has failed these meds in past: none   Patient is currently uncontrolled on the following medications:  . No medications; lifestyle medications   We discussed:  diet and exercise extensively  Plan Patient to obtain updated lipid panel in July and reassess.  Continue current medications  Osteoporosis  History of foot and femur fracture. Patient delayed decision about Prolia. Was managed by Dr. Cruzita Lederer.  As stated above, patient has been holding off Prolia due to side effects. Currently does not want to re  Last DEXA Scan: 08/20/2016   T-Score femoral neck: RFN: -3.1 LFN: -3.1   T-Score lumbar spine: -2.5  VITD  Date Value Ref Range Status  02/12/2019 39.77 30.00 - 100.00 ng/mL Final     Patient is a candidate for pharmacologic treatment due to T-Score < -2.5 in femoral neck and T-Score < -2.5 in lumbar spine  Patient has failed these meds in past: none  Patient is currently on the following medications:   Calcium carbonate- cholecalciferol 600mg -800 units, 1 tablet daily   Cholecalciferol (vitamin D3) 1000 (36mcg) units, 1 tablet once daily    We discussed:  Recommend [phone] units of vitamin D daily. Recommend 1200 mg of calcium daily from dietary and supplemental sources. Recommend weight-bearing and muscle strengthening exercises for building  and maintaining bone density.  Plan Recommend bone density scan.  Continue current medications.   Seizure  Patient dizziness has passed due to Depakote. Had follow up EEG, it was normal. Neurologist said she should stay on it for a couple of years.   Patient has failed these meds in past: none  Patient is currently controlled on the following medications:   Divalproex (Depakote ER) 500mg , 1 tablet at bedtime   We discussed: drug level monitoring and side effects of high blood levels (N/V, lethargy).   Plan Recommend valproic acid level at next scheduled blood work.  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 Reports since starting this has helped her.   Patient has failed these meds in past: none  Patient is currently controlled on the following medications:   Citalopram 10mg , 1 tablet once daily   Plan Continue current medications  Aspirin therapy  Patient recommended to start aspirin from Dr. Leonie Man (note [id number]).   Patient is currently controlled on the following medications:   Aspirin 81mg , 1 tablet once daily   Plan Continue current medications  Osteoarthritis   Saw Dr. Corena Pilgrim (does not see her regulary) and patient states turmeric was recommended by her.  Patient states working well for her.   Patient is currently controlled on the following medications:   Turmeric 500mg , 1 capsule once daily   Plan Continue current medications  Vaccines   Reviewed and discussed patient's vaccination history.    Immunization History  Administered Date(s) Administered  . Fluad Quad(high Dose 65+) 11/10/2018  . Influenza Split 10/20/2012, 10/12/2013, 10/12/2014  . Influenza, High Dose Seasonal PF 12/11/2016, 10/23/2017  . PFIZER SARS-COV-2 Vaccination 02/27/2019, 03/24/2019  . Pneumococcal Conjugate-13 12/06/2015  . Pneumococcal Polysaccharide-23  12/11/2016  . Td 06/19/2007  . Tdap 12/05/2017  . Zoster Recombinat (Shingrix) 02/10/2017, 04/21/2017   Plan No recommendations. Up to date.    Medication Management  [facility] Adherence: no gaps in refill history (per medication dispense history from 12/17/18 to 06/14/19)   Follow up Follow up visit with PharmD in September.  Recommend valproic acid level at next schedule lab visit (July).   Anson Crofts, PharmD Clinical Pharmacist [facility] [place] [phone]

## 2019-06-17 DIAGNOSIS — Z880 Allergy status to penicillin: Secondary | ICD-10-CM | POA: Diagnosis not present

## 2019-06-17 DIAGNOSIS — H9041 Sensorineural hearing loss, unilateral, right ear, with unrestricted hearing on the contralateral side: Secondary | ICD-10-CM | POA: Diagnosis not present

## 2019-06-17 DIAGNOSIS — H818X1 Other disorders of vestibular function, right ear: Secondary | ICD-10-CM | POA: Diagnosis not present

## 2019-06-17 DIAGNOSIS — Z79899 Other long term (current) drug therapy: Secondary | ICD-10-CM | POA: Diagnosis not present

## 2019-06-17 DIAGNOSIS — H903 Sensorineural hearing loss, bilateral: Secondary | ICD-10-CM | POA: Diagnosis not present

## 2019-06-17 DIAGNOSIS — H8101 Meniere's disease, right ear: Secondary | ICD-10-CM | POA: Diagnosis not present

## 2019-06-24 ENCOUNTER — Encounter: Payer: Self-pay | Admitting: Adult Health

## 2019-06-25 DIAGNOSIS — Z79899 Other long term (current) drug therapy: Secondary | ICD-10-CM | POA: Diagnosis not present

## 2019-06-25 DIAGNOSIS — H9041 Sensorineural hearing loss, unilateral, right ear, with unrestricted hearing on the contralateral side: Secondary | ICD-10-CM | POA: Diagnosis not present

## 2019-06-25 DIAGNOSIS — H9311 Tinnitus, right ear: Secondary | ICD-10-CM | POA: Diagnosis not present

## 2019-06-25 DIAGNOSIS — H905 Unspecified sensorineural hearing loss: Secondary | ICD-10-CM | POA: Diagnosis not present

## 2019-06-25 DIAGNOSIS — H8101 Meniere's disease, right ear: Secondary | ICD-10-CM | POA: Diagnosis not present

## 2019-06-28 ENCOUNTER — Other Ambulatory Visit: Payer: Self-pay

## 2019-06-28 DIAGNOSIS — Z79899 Other long term (current) drug therapy: Secondary | ICD-10-CM

## 2019-06-28 NOTE — Telephone Encounter (Signed)
Although I am not managing her   valproic acid medication  We can add this on to her labs  And forward to the neurology prescriber  Donna Warren can you add on order (not sure if  [facility] ) for valproic acid level dx  Medication mamagment monitoring )

## 2019-07-02 DIAGNOSIS — D235 Other benign neoplasm of skin of trunk: Secondary | ICD-10-CM | POA: Diagnosis not present

## 2019-07-02 DIAGNOSIS — D2261 Melanocytic nevi of right upper limb, including shoulder: Secondary | ICD-10-CM | POA: Diagnosis not present

## 2019-07-02 DIAGNOSIS — L821 Other seborrheic keratosis: Secondary | ICD-10-CM | POA: Diagnosis not present

## 2019-07-02 DIAGNOSIS — D1801 Hemangioma of skin and subcutaneous tissue: Secondary | ICD-10-CM | POA: Diagnosis not present

## 2019-07-02 DIAGNOSIS — D2272 Melanocytic nevi of left lower limb, including hip: Secondary | ICD-10-CM | POA: Diagnosis not present

## 2019-07-02 DIAGNOSIS — L680 Hirsutism: Secondary | ICD-10-CM | POA: Diagnosis not present

## 2019-07-02 DIAGNOSIS — D225 Melanocytic nevi of trunk: Secondary | ICD-10-CM | POA: Diagnosis not present

## 2019-07-02 DIAGNOSIS — L578 Other skin changes due to chronic exposure to nonionizing radiation: Secondary | ICD-10-CM | POA: Diagnosis not present

## 2019-07-13 ENCOUNTER — Telehealth: Payer: Self-pay | Admitting: Internal Medicine

## 2019-07-13 NOTE — Telephone Encounter (Signed)
Pt called in to speak with someone about being around someone that had been around someone that was exposed to [place] and now has COVID.  Pt does not have COVID but she wants to know her risk of getting COVID since she was around the person for about 10 mins that was exposed to the person that has COVID and they were not masked.  Pt was transferred to Triage for immediate answer.

## 2019-07-28 DIAGNOSIS — Z461 Encounter for fitting and adjustment of hearing aid: Secondary | ICD-10-CM | POA: Diagnosis not present

## 2019-08-04 ENCOUNTER — Other Ambulatory Visit: Payer: Self-pay | Admitting: Internal Medicine

## 2019-08-04 DIAGNOSIS — Z1231 Encounter for screening mammogram for malignant neoplasm of breast: Secondary | ICD-10-CM

## 2019-08-05 ENCOUNTER — Encounter: Payer: Self-pay | Admitting: Adult Health

## 2019-09-03 ENCOUNTER — Other Ambulatory Visit: Payer: PPO

## 2019-09-03 ENCOUNTER — Other Ambulatory Visit: Payer: Self-pay

## 2019-09-03 DIAGNOSIS — Z79899 Other long term (current) drug therapy: Secondary | ICD-10-CM

## 2019-09-03 DIAGNOSIS — E7800 Pure hypercholesterolemia, unspecified: Secondary | ICD-10-CM

## 2019-09-03 DIAGNOSIS — R7989 Other specified abnormal findings of blood chemistry: Secondary | ICD-10-CM

## 2019-09-03 NOTE — Addendum Note (Signed)
Addended by: Jerolyn Center on: 09/03/2019 10:52 AM   Modules accepted: Orders

## 2019-09-04 LAB — HEPATIC FUNCTION PANEL
AG Ratio: 1.9 (calc) (ref 1.0–2.5)
ALT: 20 U/L (ref 6–29)
AST: 17 U/L (ref 10–35)
Albumin: 4.4 g/dL (ref 3.6–5.1)
Alkaline phosphatase (APISO): 52 U/L (ref 37–153)
Bilirubin, Direct: 0.2 mg/dL (ref 0.0–0.2)
Globulin: 2.3 g/dL (calc) (ref 1.9–3.7)
Indirect Bilirubin: 0.8 mg/dL (calc) (ref 0.2–1.2)
Total Bilirubin: 1 mg/dL (ref 0.2–1.2)
Total Protein: 6.7 g/dL (ref 6.1–8.1)

## 2019-09-04 LAB — LIPID PANEL
Cholesterol: 223 mg/dL — ABNORMAL HIGH (ref <200)
HDL: 64 mg/dL (ref >=50)
LDL Cholesterol (Calc): 142 mg/dL (calc) — ABNORMAL HIGH
Non-HDL Cholesterol (Calc): 159 mg/dL (calc) — ABNORMAL HIGH (ref <130)
Total CHOL/HDL Ratio: 3.5 (calc) (ref <5.0)
Triglycerides: 71 mg/dL (ref <150)

## 2019-09-04 LAB — VALPROIC ACID LEVEL: Valproic Acid Lvl: 36.1 mg/L — ABNORMAL LOW (ref 50.0–100.0)

## 2019-09-06 NOTE — Telephone Encounter (Signed)
See other message

## 2019-09-06 NOTE — Telephone Encounter (Signed)
So the "low valproic acid level" was low for some therapeutic measure    Doesn't mean  Need adjustment   If high would  Be concern for toxicity . I sent result to neurology.    Below is  Assessed CV risk still felt to be low and some improved.  The fact that  You had no significant  Plaque  In the arteries studied on scan   CTA from December is also  reassuring .  I adivse continued  Life style interventions and we can do  Further  Lab  Or coronary calcium score  But calcium   Advise  Lab and  cpx by  January 2022   due in Januyary 2022   Can do virtual or fu visit in interim  If want to discuss all of above      The 10-year ASCVD risk score [name] DC Brooke Bonito., et al., 2013) is: 6%   Values used to calculate the score:     Age: 69 years     Sex: Female     Is Non-Hispanic African American: No     Diabetic: No     Tobacco smoker: No     Systolic Blood Pressure: 97 mmHg     Is BP treated: Yes     HDL Cholesterol: 64 mg/dL     Total Cholesterol: 223 mg/dL

## 2019-09-06 NOTE — Progress Notes (Signed)
Cholesterol about the same   slighty bette profice with ratio of 3.5  . Liver panel normal , Valpro ic acid level non elevated .    ( please document what time  you take the dose of [facility])   Forwarding  to dr Leonie Man

## 2019-09-15 ENCOUNTER — Telehealth: Payer: Self-pay | Admitting: Internal Medicine

## 2019-09-15 NOTE — Telephone Encounter (Signed)
Please see message.  Please advise. 

## 2019-09-15 NOTE — Telephone Encounter (Signed)
Pt stated she was in a room of 30 people, she wore her mask, but one of those people tested pos for COVID this morning. She did not have contact with this person. Pt is wondering if there is any reason to be concerned? She would like to know what to do?   Pt is not having any symptoms.   Pt can be reached at [phone]

## 2019-09-16 NOTE — Telephone Encounter (Signed)
Called patient and gave her message from Dr. Regis Bill. Patient verbalized an understanding.

## 2019-09-16 NOTE — Telephone Encounter (Signed)
She is  Probably ok since precautions and she is immunized  To limit risk of complications  Even if gets the virus .  She can get tested  About 4-5 days after exposures or if getting any symptoms    Which are often  a sinus congestion or mild cold runny nose  . With the delta variant

## 2019-09-26 IMAGING — MR MR ELBOW*R* W/O CM
6 series · 40 of 40 positions shown · non-contrast
Comparison: None.

CLINICAL DATA: Diffuse right elbow pain and tingling in the right
arm for 9-10 months. No recent injury.

EXAM:
MRI OF THE RIGHT ELBOW WITHOUT CONTRAST
TECHNIQUE: Multiplanar, multisequence MR imaging of the elbow was performed. No
intravenous contrast was administered.

[Series 9: T1 · coronal · right · 3.0mm · 0.44mm/px · 5 of 21 slices shown (1 of 2)]
[im 1/21]
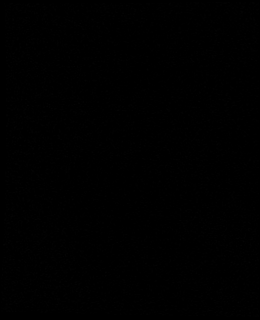
[im 6/21]
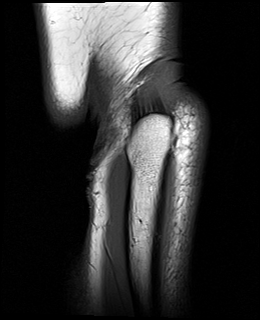
[im 11/21]
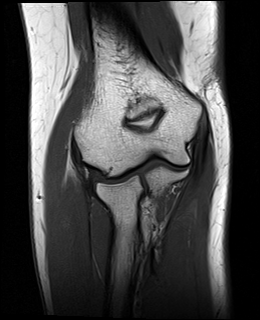
[im 16/21]
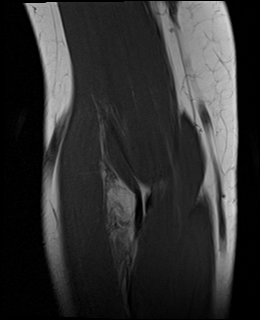
[im 21/21]
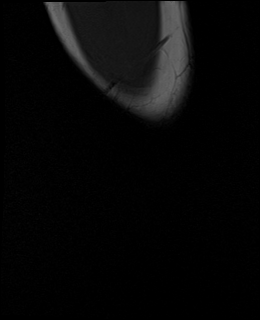

[Series 10: T2 fat-sat · coronal · right · 3.0mm · 0.44mm/px · 6 of 21 slices shown (1 of 2)]
[im 1/21]
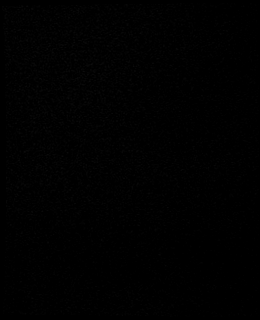
[im 5/21]
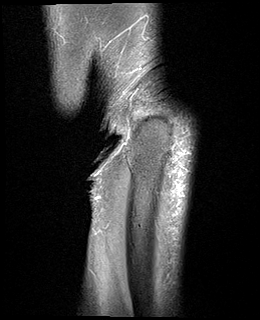
[im 9/21]
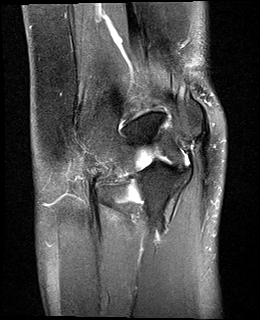
[im 13/21]
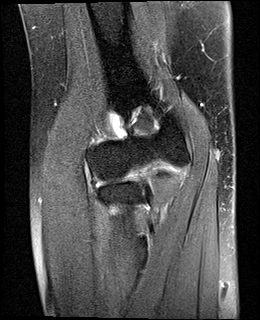
[im 17/21]
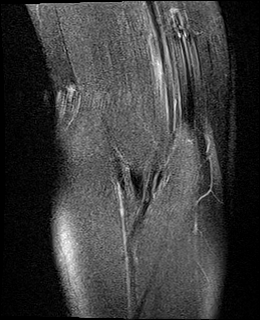
[im 21/21]
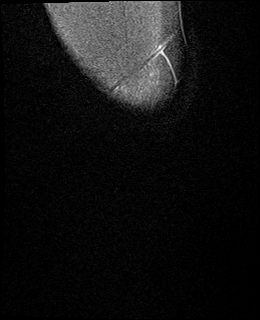

[Series 11: T1 · axial · right · 3.0mm · 0.38mm/px · z∈[-80,+21]mm · 8 of 27 slices shown (2 of 2)]
[im 1/27]
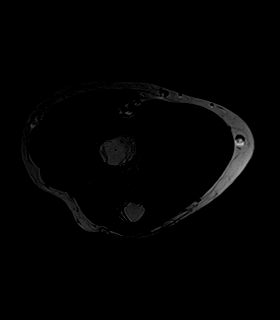
[im 4/27]
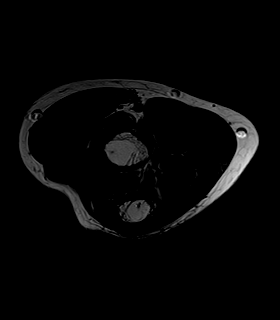
[im 8/27]
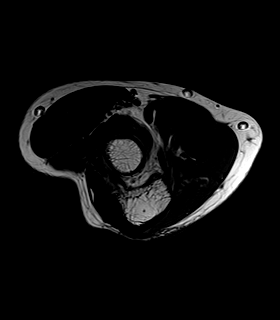
[im 12/27]
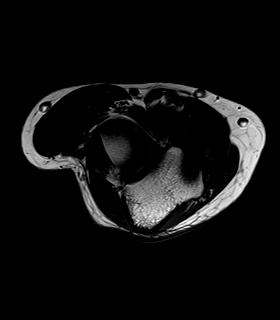
[im 15/27]
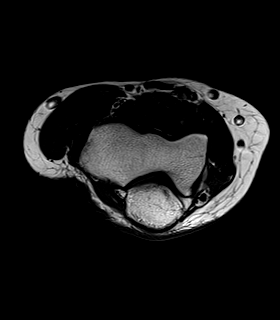
[im 19/27]
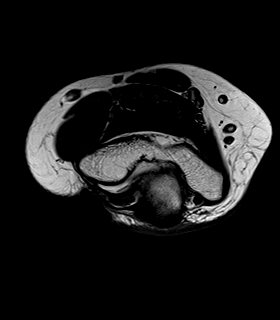
[im 23/27]
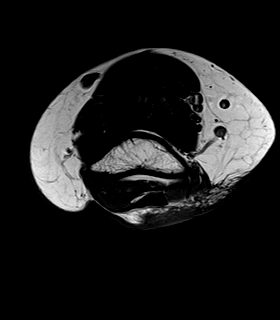
[im 27/27]
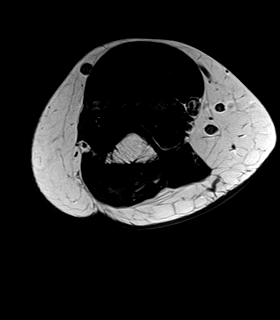

[Series 12: T2 fat-sat · axial · right · 3.0mm · 0.38mm/px · z∈[-80,+21]mm · 8 of 27 slices shown (2 of 2)]
[im 1/27]
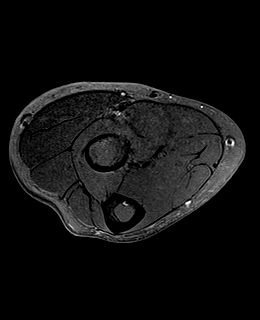
[im 4/27]
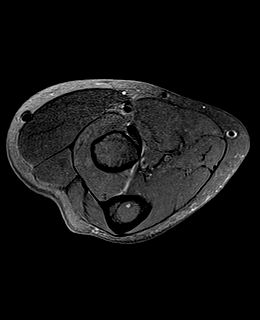
[im 8/27]
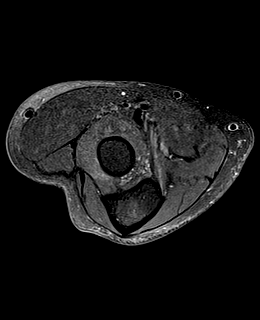
[im 12/27]
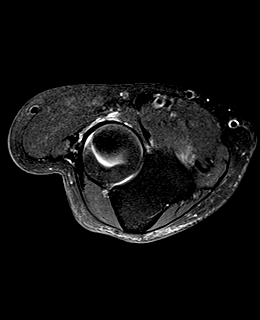
[im 15/27]
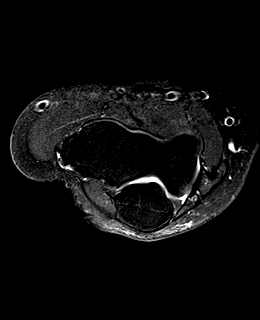
[im 19/27]
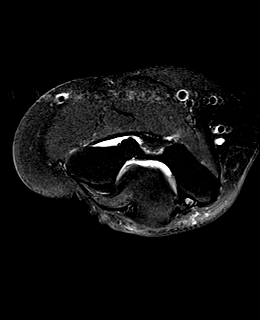
[im 23/27]
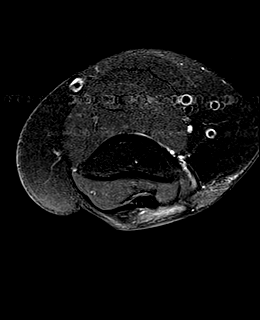
[im 27/27]
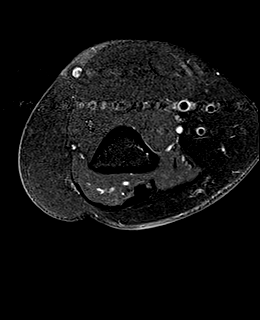

[Series 13: PD fat-sat · sagittal · right · 3.0mm · 0.44mm/px · 7 of 23 slices shown]
[im 1/23]
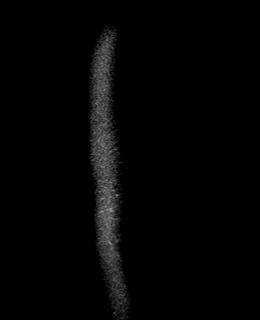
[im 4/23]
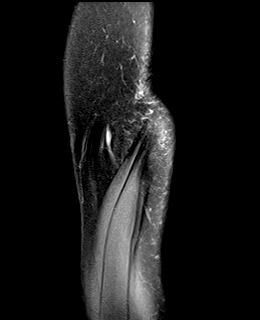
[im 8/23]
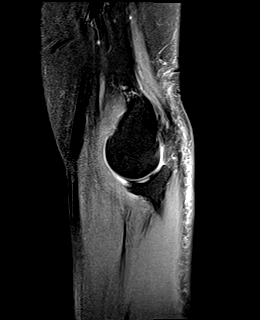
[im 12/23]
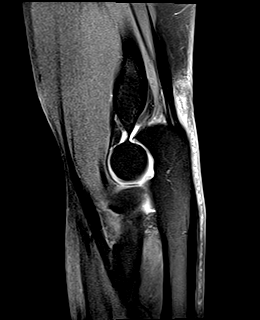
[im 15/23]
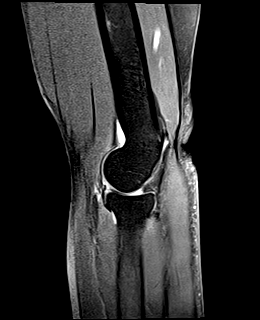
[im 19/23]
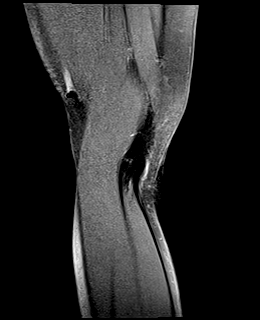
[im 23/23]
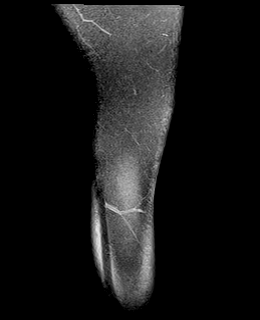

[Series 14: STIR · coronal · right · 3.0mm · 0.55mm/px · 6 of 20 slices shown]
[im 1/20]
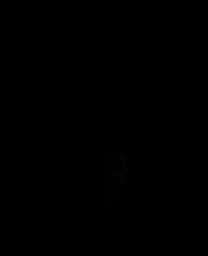
[im 4/20]
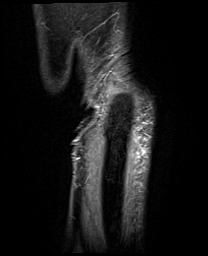
[im 8/20]
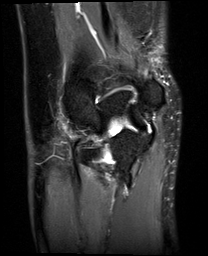
[im 12/20]
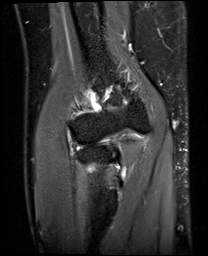
[im 16/20]
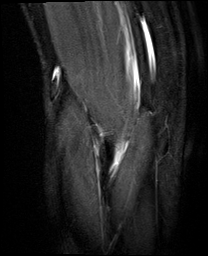
[im 20/20]
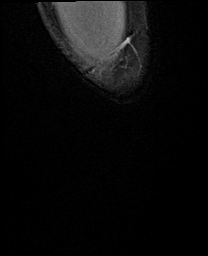

[40 of 40 positions shown; findings below may reference images not displayed]

FINDINGS: TENDONS

Common forearm flexor origin: Intact and normal in appearance.

Common forearm extensor origin: Mildly increased T2 signal in the
common extensor origin is identified. No tendon tear.

Biceps: Intact.

Triceps: Intact.

LIGAMENTS

Medial stabilizers: Intact.

Lateral stabilizers:  Intact.

Cartilage: Preserved.

Joint: No effusion.

Cubital tunnel: Normal.

Bones: Normal.
IMPRESSION: Mildly increased T2 signal at the common extensor origin compatible
with lateral epicondylitis. Negative for tendon or ligament tear. No
bony abnormality.

## 2019-10-14 ENCOUNTER — Other Ambulatory Visit: Payer: Self-pay | Admitting: Internal Medicine

## 2019-10-14 DIAGNOSIS — Z1231 Encounter for screening mammogram for malignant neoplasm of breast: Secondary | ICD-10-CM

## 2019-10-15 ENCOUNTER — Ambulatory Visit: Payer: PPO

## 2019-10-15 DIAGNOSIS — L720 Epidermal cyst: Secondary | ICD-10-CM | POA: Diagnosis not present

## 2019-10-15 DIAGNOSIS — L810 Postinflammatory hyperpigmentation: Secondary | ICD-10-CM | POA: Diagnosis not present

## 2019-10-15 DIAGNOSIS — M810 Age-related osteoporosis without current pathological fracture: Secondary | ICD-10-CM

## 2019-10-15 DIAGNOSIS — M67442 Ganglion, left hand: Secondary | ICD-10-CM | POA: Diagnosis not present

## 2019-10-15 DIAGNOSIS — E785 Hyperlipidemia, unspecified: Secondary | ICD-10-CM

## 2019-10-15 DIAGNOSIS — D239 Other benign neoplasm of skin, unspecified: Secondary | ICD-10-CM | POA: Diagnosis not present

## 2019-10-15 DIAGNOSIS — S90221D Contusion of right lesser toe(s) with damage to nail, subsequent encounter: Secondary | ICD-10-CM | POA: Diagnosis not present

## 2019-10-15 NOTE — Chronic Care Management (AMB) (Signed)
Chronic Care Management Pharmacy  Name: Donna Warren  MRN: [id number] DOB: 01/30/1950  Initial Questions: 1. Have you seen any other providers since your last visit? Yes  2. Any changes in your medicines or health? No   Chief Complaint/ HPI  Donna Warren,  69 y.o. , female presents for their Follow-Up CCM visit with the clinical pharmacist via telephone due to COVID-19 Pandemic.   PCP : Burnis Medin, MD  Their chronic conditions include: Meniere's disease, HLD, osteoporosis, seizure, anxiety, osteoarthritis  Office Visits: 06/09/19- Shanon Ace, MD- Patient presented for office visit. Patient to obtain repeat lipid and liver panel. Consider coronary calcium scan for risk assessment. Patient to see dermatology for left great toe nail discoloration.   Consult Visit: 07/28/19 Lowell Bouton (audiology): Patient presented for hearing aid consultation and reports hearing in her right ear has been declining. Discussed a [facility] hearing system and scheduled for a cochlear imlpant evaluation.  07/02/19 Christina Haverstock (dermatology): Patient presented for follow up. Unable to access notes.  06/25/2019- Otolaryngology- Jeneen Rinks- Patient presented for office visit for follow up for meniere's disease. Continue on Dyzaide 37.5 mg daily and low sodium diet. Referred to audiology. Follow up in 6 months for audiogram.  06/17/2019- Otolaryngology- Jeneen Rinks- Patient presented for office visit for follow up for meniere's disease post 2 doses of intratympanic dexamethasone. Third dose of dexamethasone given. Follow up in 1 week.  06/11/2019- Otolaryngology- Jeneen Rinks- Patient presented for office visit for follow up for meniere's disease, s/p intratympanic dexamethasone and further decrease of hearing in her right ear. Patient received 2nd dose of dexamethasone. Patient to continue Dyazide 37.5mg  daily, low salt diet. Patient to return in 1 week for follow up with an  audiogam. Patient referred to audiology for a hearing aid evaluation and cochlear implant testing.   06/03/2019- Otolaryngology- Jeneen Rinks- Patient presented for office visit for  Follow up for meniere's disease, s/p intratympanic dexamethasone. Patient reported improvement in hearing in right ear. Patient to continue current medications and follow up in 1 week for audiogram.   04/27/2019- Neurology- Antony Contras, MD- Patient presented for office visit for follow up regarding likely focal seizures. Patient inquired on Depakote and causing dizziness. Depakote taper and discontinuation was considered, but patient preferred to hold off and think about it. Patient to continue Depakote ER 500mg  daily and follow up in 6 months or earlier if needed.   04/01/2019- Audiology- Bess Kinds- patient presented for office visit. Patient instructed in exercises known to improve the vestibular ocular response (VOR). Patient to follow up with Dr. Leonie Man and Dr. Thornell Mule for additional testing and treatment as indicated.   Medications: Outpatient Encounter Medications as of 10/15/2019  Medication Sig  . aspirin EC 81 MG tablet Take 81 mg by mouth daily.  . Calcium Carb-Cholecalciferol (CALCIUM 600/VITAMIN D3) 600-800 MG-UNIT TABS Take 1 tablet by mouth daily.  . chlorthalidone (HYGROTON) 25 MG tablet TAKE 1 TABLET BY MOUTH EVERY DAY  . cholecalciferol (VITAMIN D3) 25 MCG (1000 UT) tablet Take 1,000 Units by mouth daily.  . citalopram (CELEXA) 10 MG tablet TAKE 1 TABLET BY MOUTH EVERY DAY  . diazepam (VALIUM) 2 MG tablet Take 2 mg by mouth as needed (for Meniere's flare up).   . divalproex (DEPAKOTE ER) 500 MG 24 hr tablet Take by mouth.  . divalproex (DEPAKOTE ER) 500 MG 24 hr tablet TAKE 1 TABLET (500 MG TOTAL) BY MOUTH AT BEDTIME.  . LORazepam (ATIVAN) 0.5 MG tablet  1-2 po if needed for anxiety up to every 8 hours (Patient not taking: Reported on 06/15/2019)  . ondansetron (ZOFRAN-ODT) 4 MG disintegrating  tablet Take 4 mg by mouth every 8 (eight) hours as needed.   . Turmeric 500 MG CAPS Take 1 capsule by mouth daily.    Facility-Administered Encounter Medications as of 10/15/2019  Medication  . 0.9 %  sodium chloride infusion     Current Diagnosis/Assessment:  Goals Addressed            This Visit's Progress   . Pharmacy Care Plan       CARE PLAN ENTRY  Current Barriers:  . Chronic Disease Management support, education, and care coordination needs related to Hyperlipidemia, Osteoporosis, and seizure disorder, Meniere's disease   Meniere's disease  . Pharmacist Clinical Goal(s): o Over the next 180 days, patient will work with PharmD and providers to maintain minimum symptoms related to Meniere's.  . Current regimen:   Chlorthalidone 25mg , 1 tablet once daily   Lorazepam 0.5mg , take 1 to 2 tablet as needed for meniere's flare up   Diazepam (Valium) 2mg  as needed for meniere's flare up   Ondansetron (Zofran ODT) 4mg , 1 tablet every eight hours as needed  . Patient self care activities - Over the next 180 days, patient will: o Continue current medications and follow up with Dr. Thornell Mule.  o Check blood pressure at home weekly and when she is feeling lightheaded or dizzy to make sure medicines are not dropping blood pressure too low  Hyperlipidemia . Pharmacist Clinical Goal(s): o Over the next 180 days, patient will work with PharmD and providers to achieve LDL goal recommended by primary care provider.  . Current regimen:  o Lifestyle modifications  . Interventions: . A diet high in plant sterols (fruits/vegetables/nuts/whole grains/legumes) may reduce your cholesterol.  Encouraged increasing fiber to a daily intake of 10-25g/day  . Patient self care activities - Over the next 180 days, patient will: o Working on lifestyle modifications (diet) and exercise  Osteoporosis . Pharmacist Clinical Goal(s): o Over the next 180 days, patient will work with PharmD and providers to  optimize calcium and vitamin D therapy.  . Current regimen:   Calcium carbonate- cholecalciferol 600mg -800 units, 1 tablet daily   Cholecalciferol (vitamin D3) 1000 (92mcg) units, 1 tablet once daily  . Interventions: o Recommend [phone] units of vitamin D daily.  o Recommend 1200 mg of calcium daily from dietary and supplemental sources . Patient self care activities - Over the next 180 days, patient will: o Continue current medications and follow up as needed for additional therapy as indicated.  Seizure . Pharmacist Clinical Goal(s) o Over the next 180 days, patient will work with PharmD and providers to prevent occurrence of seizure.  . Current regimen:  o Divalproex (Depakote ER) 500mg , 1 tablet at bedtime  . Interventions: o Discussed routine monitoring of valproic acid levels.  . Patient self care activities - Over the next 180 days, patient will: o Keep follow up visit with Dr. Leonie Man.   Medication management . Pharmacist Clinical Goal(s): o Over the next 180 days, patient will work with PharmD and providers to maintain optimal medication adherence . Current [facility]  . Interventions o Comprehensive medication review performed. o Continue current medication management strategy . Patient self care activities - Over the next 180 days, patient will: o Take medications as prescribed o Report any questions or concerns to PharmD and/or provider(s)  Please see past updates related to this  goal by clicking on the "Past Updates" button in the selected goal        SDOH Interventions     Most Recent Value  SDOH Interventions  Financial Strain Interventions Intervention Not Indicated  Transportation Interventions Intervention Not Indicated      Meniere's disease   Patient reports only using benzos to make her go to sleep when it is too much. Patient reports not taking diazepam and lorazepam together, but states diazepam works better for her. States she has not needed to  take prn meds for a "long time".    Office blood pressures are  BP Readings from Last 3 Encounters:  06/09/19 114/70  04/27/19 108/72  02/24/19 118/60   Patient is controlled on:   Chlorthalidone 25mg , 1 tablet once daily   Lorazepam 0.5mg , take 1 to 2 tablet as needed for meniere's flare up   Diazepam (Valium) 2mg  as needed for meniere's flare up   Ondansetron (Zofran ODT) 4mg , 1 tablet every eight hours as needed   We discussed: the importance of checking blood pressure at home especially if she is feeling lightheaded or dizzy.  Plan Managed by Dr. Thornell Mule. Has close follow up this.  Reports dizziness/lightheadedness occassionally.  Continue current medications   Hyperlipidemia  Patient reported discussing lipid panel with Dr. Regis Bill and still on the fence about starting a statin. ASCVD risk is low. May benefit from CAC scoring to determine further risk.  Lipid Panel     Component Value Date/Time   CHOL 223 (H) 09/03/2019 1104   TRIG 71 09/03/2019 1104   HDL 64 09/03/2019 1104   CHOLHDL 3.5 09/03/2019 1104   VLDL 16.6 02/12/2019 1025   LDLCALC 142 (H) 09/03/2019 1104   LDLDIRECT 129.1 02/16/2013 0858     The 10-year ASCVD risk score [name] DC Jr., et al., 2013) is: 6%   Values used to calculate the score:     Age: 51 years     Sex: Female     Is Non-Hispanic African American: No     Diabetic: No     Tobacco smoker: No     Systolic Blood Pressure: 97 mmHg     Is BP treated: Yes     HDL Cholesterol: 64 mg/dL     Total Cholesterol: 223 mg/dL   Patient has failed these meds in past: none   Patient is currently uncontrolled on the following medications:  . No medications; lifestyle medications   We discussed:  diet and exercise extensively  -Diet: patient is planning on working on getting an appointment with a nutritionist to take a closer look at her diet -Exercise: recommended goal of 150 mins/week of moderate intensity exercise  Plan Will attach information  about diet and foods to limit with elevated LDL. Continue current medications  Osteoporosis  History of foot and femur fracture. Patient is still concerned about starting Prolia due to side effects. Discussed how it was different from bisphosphonates and risk of atypical femur fracture is < 1%.  Last DEXA Scan: 08/20/2016   T-Score femoral neck: RFN: -3.1 LFN: -3.1   T-Score lumbar spine: -2.5  VITD  Date Value Ref Range Status  02/12/2019 39.77 30.00 - 100.00 ng/mL Final     Patient is a candidate for pharmacologic treatment due to T-Score < -2.5 in femoral neck and T-Score < -2.5 in lumbar spine  Patient has failed these meds in past: none  Patient is currently on the following medications:   Calcium carbonate- cholecalciferol 600mg -800  units, 1 tablet daily   Cholecalciferol (vitamin D3) 1000 (19mcg) units, 1 tablet once daily    We discussed:  Recommend [phone] units of vitamin D daily. Recommend 1200 mg of calcium daily from dietary and supplemental sources. Recommend weight-bearing and muscle strengthening exercises for building and maintaining bone density.  Plan Recommend bone density scan.  Recommended continued follow up with endocrinology. Continue current medications.   Seizure  Patient dizziness has passed due to Depakote. Had follow up EEG, it was normal. Neurologist said she should stay on it for a couple of years.   Lab Results  Component Value Date   VALPROATE 36.1 (L) 09/03/2019   Patient has failed these meds in past: none  Patient is currently controlled on the following medications:   Divalproex (Depakote ER) 500mg , 1 tablet at bedtime   We discussed: drug level monitoring and side effects of high blood levels (N/V, lethargy).   Plan Continue current medications  Patient has appt with neurologist to discuss recent valproate level.  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 Reports since starting this has helped her.   Patient has failed these meds in past: none  Patient is currently controlled on the following medications:   Citalopram 10mg , 1 tablet once daily   Plan Continue current medications  Aspirin therapy  Patient recommended to start aspirin from Dr. Leonie Man (note 01/13/2019).   Patient is currently controlled on the following medications:   Aspirin 81mg , 1 tablet once daily   Plan Continue current medications  Patient is currently holding x 5 days due to bug bite and massive bruising around it.  Osteoarthritis   Saw Dr. Corena Pilgrim (does not see her regulary) and patient states turmeric was recommended by her.  Patient states working well for her.   Patient is currently controlled on the following medications:   Turmeric 500mg , 1 capsule once daily   We discussed: the bleed risk while taking turmeric and aspirin and if she was instructed to hold aspirin she may want to consider holding turmeric  Plan Continue current medications   Vaccines   Reviewed and discussed patient's vaccination history.    Immunization History  Administered Date(s) Administered  . Fluad Quad(high Dose 65+) 11/10/2018  . Influenza Split 10/20/2012, 10/12/2013, 10/12/2014  . Influenza, High Dose Seasonal PF 12/11/2016, 10/23/2017  . PFIZER SARS-COV-2 Vaccination 02/27/2019, 03/24/2019  . Pneumococcal Conjugate-13 12/06/2015  . Pneumococcal Polysaccharide-23 12/11/2016  . Td 06/19/2007  . Tdap 12/05/2017  . Zoster Recombinat (Shingrix) 02/10/2017, 04/21/2017   Plan Recommended for patient to receive influenza vaccine at pharmacy/in office.  Medication Management   Pt [facility] for [facility] medications Pt endorses 100% compliance  We discussed: Discussed benefits of medication synchronization, packaging and delivery as well as enhanced pharmacist oversight with [employer].  Plan   Continue current medication management  strategy  Follow up: 6 month phone visit  Jeni Salles, PharmD Clinical Pharmacist [place] [facility] [phone]

## 2019-10-18 NOTE — Patient Instructions (Addendum)
Hi Donna Warren!  It was such a pleasure getting to speak with you over the phone and I look forward to our future touch bases! As discussed, continue with making some dietary changes for a heart healthy lifestyle and continue adding in exercise throughout the day. I also want to to check your blood pressure if you are feeling dizzy or lightheaded in the future to rule that out as the culprit. I think it is important for you to continue to follow up with Endocrinology as they likely will want to do another bone scan and may be able to discuss other medication options with you beyond Prolia.  As always, don't hesitate to reach out to me if you need anything in the meantime!  Best, Maddie  Donna Warren, PharmD Clinical Pharmacist [place] [facility] [place]   Visit Information  Goals Addressed            This Visit's Progress   . Pharmacy Care Plan       CARE PLAN ENTRY  Current Barriers:  . Chronic Disease Management support, education, and care coordination needs related to Hyperlipidemia, Osteoporosis, and seizure disorder, Meniere's disease   Meniere's disease  . Pharmacist Clinical Goal(s): o Over the next 180 days, patient will work with PharmD and providers to maintain minimum symptoms related to Meniere's.  . Current regimen:   Chlorthalidone 25mg , 1 tablet once daily   Lorazepam 0.5mg , take 1 to 2 tablet as needed for meniere's flare up   Diazepam (Valium) 2mg  as needed for meniere's flare up   Ondansetron (Zofran ODT) 4mg , 1 tablet every eight hours as needed  . Patient self care activities - Over the next 180 days, patient will: o Continue current medications and follow up with Dr. Thornell Mule.  o Check blood pressure at home weekly and when she is feeling lightheaded or dizzy to make sure medicines are not dropping blood pressure too low  Hyperlipidemia . Pharmacist Clinical Goal(s): o Over the next 180 days, patient will work with PharmD and providers  to achieve LDL goal recommended by primary care provider.  . Current regimen:  o Lifestyle modifications  . Interventions: . A diet high in plant sterols (fruits/vegetables/nuts/whole grains/legumes) may reduce your cholesterol.  Encouraged increasing fiber to a daily intake of 10-25g/day  . Patient self care activities - Over the next 180 days, patient will: o Working on lifestyle modifications (diet) and exercise  Osteoporosis . Pharmacist Clinical Goal(s): o Over the next 180 days, patient will work with PharmD and providers to optimize calcium and vitamin D therapy.  . Current regimen:   Calcium carbonate- cholecalciferol 600mg -800 units, 1 tablet daily   Cholecalciferol (vitamin D3) 1000 (49mcg) units, 1 tablet once daily  . Interventions: o Recommend [phone] units of vitamin D daily.  o Recommend 1200 mg of calcium daily from dietary and supplemental sources . Patient self care activities - Over the next 180 days, patient will: o Continue current medications and follow up as needed for additional therapy as indicated.  Seizure . Pharmacist Clinical Goal(s) o Over the next 180 days, patient will work with PharmD and providers to prevent occurrence of seizure.  . Current regimen:  o Divalproex (Depakote ER) 500mg , 1 tablet at bedtime  . Interventions: o Discussed routine monitoring of valproic acid levels.  . Patient self care activities - Over the next 180 days, patient will: o Keep follow up visit with Dr. Leonie Man.   Medication management . Pharmacist Clinical Goal(s): o Over the  next 180 days, patient will work with PharmD and providers to maintain optimal medication adherence . Current pharmacy: [facility]  . Interventions o Comprehensive medication review performed. o Continue current medication management strategy . Patient self care activities - Over the next 180 days, patient will: o Take medications as prescribed o Report any questions or concerns to PharmD and/or  provider(s)  Please see past updates related to this goal by clicking on the "Past Updates" button in the selected goal         The patient verbalized understanding of instructions provided today and declined a print copy of patient instruction materials.   Telephone follow up appointment with pharmacy team member scheduled for: 6 months  Donna Warren, PharmD Clinical Pharmacist [place] [facility] [place] protect organs, store calcium, anchor muscles, and support the whole body. Keeping your bones strong is important, especially as you get older. You can take actions to help keep your bones strong and healthy. Why is keeping my bones healthy important?  Keeping your bones healthy is important because your body constantly replaces bone cells. Cells get old, and new cells take their place. As we age, we lose bone cells because the body may not be able to make enough new cells to replace the old cells. The amount of bone cells and bone tissue you have is referred to as bone mass. The higher your bone mass, the stronger your bones. The aging process leads to an overall loss of bone mass in the body, which can increase the likelihood of:  Joint pain and stiffness.  Broken bones.  A condition in which the bones become weak and brittle (osteoporosis). A large decline in bone mass occurs in older adults. In women, it occurs about the time of menopause. What actions can I take to keep my bones healthy? Good health habits are important for maintaining healthy bones. This includes eating nutritious foods and exercising regularly. To have healthy bones, you need to get enough of the right minerals and vitamins. Most nutrition experts recommend getting these nutrients from the foods that you eat. In some cases, taking supplements may also be recommended. Doing certain types of exercise is also important for bone health. What are the nutritional  recommendations for healthy bones?  Eating a well-balanced diet with plenty of calcium and vitamin D will help to protect your bones. Nutritional recommendations vary from person to person. Ask your health care provider what is healthy for you. Here are some general guidelines. Get enough calcium Calcium is the most important (essential) mineral for bone health. Most people can get enough calcium from their diet, but supplements may be recommended for people who are at risk for osteoporosis. Good sources of calcium include:  Dairy products, such as low-fat or nonfat milk, cheese, and yogurt.  Dark green leafy vegetables, such as bok choy and broccoli.  Calcium-fortified foods, such as orange juice, cereal, bread, soy beverages, and tofu products.  Nuts, such as almonds. Follow these recommended amounts for daily calcium intake:  Children, age 29-3: 700 mg.  Children, age 43-8: 1,000 mg.  Children, age 28-13: 1,300 mg.  Teens, age 30-18: 1,300 mg.  Adults, age 59-50: 1,000 mg.  Adults, age 31-70: ? Men: 1,000 mg. ? Women: 1,200 mg.  Adults, age 60 or older: 1,200 mg.  Pregnant and breastfeeding females: ? Teens: 1,300 mg. ? Adults: 1,000 mg. Get enough vitamin D Vitamin D is the most essential vitamin for bone  health. It helps the body absorb calcium. Sunlight stimulates the skin to make vitamin D, so be sure to get enough sunlight. If you live in a cold climate or you do not get outside often, your health care provider may recommend that you take vitamin D supplements. Good sources of vitamin D in your diet include:  Egg yolks.  Saltwater fish.  Milk and cereal fortified with vitamin D. Follow these recommended amounts for daily vitamin D intake:  Children and teens, age 57-18: 600 international units.  Adults, age 47 or younger: 400-800 international units.  Adults, age 76 or older: 800-1,000 international units. Get other important nutrients Other nutrients that are  important for bone health include:  Phosphorus. This mineral is found in meat, poultry, dairy foods, nuts, and legumes. The recommended daily intake for adult men and adult women is 700 mg.  Magnesium. This mineral is found in seeds, nuts, dark green vegetables, and legumes. The recommended daily intake for adult men is 400-420 mg. For adult women, it is 310-320 mg.  Vitamin K. This vitamin is found in green leafy vegetables. The recommended daily intake is 120 mg for adult men and 90 mg for adult women. What type of physical activity is best for building and maintaining healthy bones? Weight-bearing and strength-building activities are important for building and maintaining healthy bones. Weight-bearing activities cause muscles and bones to work against gravity. Strength-building activities increase the strength of the muscles that support bones. Weight-bearing and muscle-building activities include:  Walking and hiking.  Jogging and running.  Dancing.  Gym exercises.  Lifting weights.  Tennis and racquetball.  Climbing stairs.  Aerobics. Adults should get at least 30 minutes of moderate physical activity on most days. Children should get at least 60 minutes of moderate physical activity on most days. Ask your health care provider what type of exercise is best for you. How can I find out if my bone mass is low? Bone mass can be measured with an X-ray test called a bone mineral density (BMD) test. This test is recommended for all women who are age 40 or older. It may also be recommended for:  Men who are age 25 or older.  People who are at risk for osteoporosis because of: ? Having bones that break easily. ? Having a long-term disease that weakens bones, such as kidney disease or rheumatoid arthritis. ? Having menopause earlier than normal. ? Taking medicine that weakens bones, such as steroids, thyroid hormones, or hormone treatment for breast cancer or prostate  cancer. ? Smoking. ? Drinking three or more alcoholic drinks a day. If you find that you have a low bone mass, you may be able to prevent osteoporosis or further bone loss by changing your diet and lifestyle. Where can I find more information? For more information, check out the following websites:  [place]: AviationT[employer].fr  [employer] of Health: www.bones.SouthExposed.es  International Osteoporosis Foundation: Administrator.iofbonehealth.org Summary  The aging process leads to an overall loss of bone mass in the body, which can increase the likelihood of broken bones and osteoporosis.  Eating a well-balanced diet with plenty of calcium and vitamin D will help to protect your bones.  Weight-bearing and strength-building activities are also important for building and maintaining strong bones.  Bone mass can be measured with an X-ray test called a bone mineral density (BMD) test. This information is not intended to replace advice given to you by your health care provider. Make sure you discuss any questions  you have with your health care provider. Document Revised: 02/03/2017 Document Reviewed: 02/03/2017 Elsevier Patient Education  2020 [employer].

## 2019-10-21 ENCOUNTER — Other Ambulatory Visit: Payer: Self-pay | Admitting: Internal Medicine

## 2019-10-24 NOTE — Telephone Encounter (Signed)
please order dexa scan  Wherever she had previous  One   Dx estrogen deficiency and osteoporosis

## 2019-10-25 ENCOUNTER — Other Ambulatory Visit: Payer: Self-pay

## 2019-10-25 DIAGNOSIS — M810 Age-related osteoporosis without current pathological fracture: Secondary | ICD-10-CM

## 2019-10-25 DIAGNOSIS — E2839 Other primary ovarian failure: Secondary | ICD-10-CM

## 2019-10-26 ENCOUNTER — Other Ambulatory Visit: Payer: Self-pay | Admitting: Internal Medicine

## 2019-10-27 ENCOUNTER — Encounter: Payer: Self-pay | Admitting: Neurology

## 2019-10-27 ENCOUNTER — Ambulatory Visit: Payer: PPO | Admitting: Neurology

## 2019-10-27 VITALS — BP 108/54 | HR 67 | Ht 64.0 in | Wt 127.2 lb

## 2019-10-27 DIAGNOSIS — G43009 Migraine without aura, not intractable, without status migrainosus: Secondary | ICD-10-CM | POA: Diagnosis not present

## 2019-10-27 DIAGNOSIS — R202 Paresthesia of skin: Secondary | ICD-10-CM | POA: Diagnosis not present

## 2019-10-27 MED ORDER — DIVALPROEX SODIUM ER 500 MG PO TB24
500.0000 mg | ORAL_TABLET | Freq: Every day | ORAL | 3 refills | Status: DC
Start: 2019-10-27 — End: 2020-11-08

## 2019-10-27 NOTE — Progress Notes (Signed)
[facility] [address] [place]. [place] [address] [phone]       OFFICE FOLLOW UP NOTE  Ms. Donna Warren Date of Birth:  Mar 06, 1950 Medical Record Number:  [id number]   Reason for Referral:  hospital stroke follow up    CHIEF COMPLAINT:  Chief Complaint  Patient presents with  . Follow-up    No new complaints, no dizziness or very mild rarely  . Room 1    Alone in room    HPI: Initial visit 02/24/2019 Donna Macho, NP ) Donna Warren being seen today for in office hospital follow-up regarding likely focal seizures versus complicated migraine on 97/94/8016.  History obtained from patient and chart review. Reviewed all radiology images and labs personally.  Ms. Donna Warren is a 69 y.o. female 69 y.o.femalewho presented on 01/12/2019 with transient speech difficulties and right hand paresthesias.  Evaluated by stroke team and Donna Warren with completion of stroke work-up and recurrent episodes possibly focal seizures versus complicated migraine less likely stroke or TIA.  CT head and MRI brain negative.  CTA head/neck mild arthrosclerotic changes in cavernous internal carotids bilaterally.  2D echo unremarkable.  COVID-19 negative. EEG findings are c/w left temporal region dysfunction. She was started on Depakote 500mg  ER daily and recommended repeat EEG at follow-up visit.  Recommend initiating aspirin 81 mg daily for stroke prevention.  HTN stable and recommended continuation of home dose chlorthalidone.  LDL 185 and recommended follow-up with PCP for possibly initiating statin therapy -was not done during admission due to no evidence of stroke and low suspicion for TIA.  No evidence or history of DM with A1c 5.2.  No other stroke risk factors.  No prior history of stroke.  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  Discharged home in stable condition without therapy needs.  Donna Warren is a  69 year old female who is being seen today for hospital follow-up.  She has been doing well since discharge without reoccurring transient speech difficulties or right hand paresthesias.  She has continued on Depakote ER 500 mg daily but does have concerns regarding possible side effects including dizziness and bowel movement changes.  She does have underlying history of Mnire's disease but feels as though dizziness since starting Depakote different from Mnire's dizziness.  She has not had follow-up with otolaryngology provider Dr. Thornell Warren since discharge.  She continues on aspirin 81 mg daily without bleeding or bruising.  Blood pressure today 118/60.  She does question past year short-term memory loss with concerns of possibility of dementia or Alzheimer's and questions possible need of further testing.  She is currently working as a Education officer, museum running a Public librarian.  She does endorse increased stressors especially since COVID-19 pandemic and is a "type A" person placing increased pressure and stress on herself.  She was recently started on citalopram by PCP with some benefit.  Examples of short-term memory loss includes being told something and at times will have difficulty remembering.  She is able to maintain ADLs and all IADLs without difficulty.  No further concerns at this time.  Update 04/27/2019 : Donna Warren is seen upon her request today.  She states her main concern today is she is having dizziness but she states she has Mnire's disease which is episodic and has had dizziness in the past.  She read some of the side effects of Depakote which she is taking and wonders if it may be causing  dizziness.  She describes this as feeling of slight imbalance which is intermittent.  She denies true vertigo or significant gait or balance problems.  She is currently on Depakote ER 500 mg daily and states it is working well since she has had no further episodes of recurrent complicated migraine or  seizure-like episode.  She was seen recently by my nurse practitioner but feels she needs to see a physician.  She has no new complaints.  She remains on aspirin which is tolerating well.  Blood pressures well controlled today it is 108/72.  She had a repeat EEG on 03/04/2019 which was normal. Update 10/27/2019 : She returns for follow-up after last visit 6 months ago.  She states she is doing well.  She has had no further episodes of dizziness, numbness, seizure-like activity her typical migraines.  She is tolerating Depakote ER 500 mg daily without significant side effects.  Valproic acid level on 09/03/2019 was 36.1.  She does have Mnire's disease in the head recently undergone vestibular study.  She is still has some occasional mild dizziness but it is not as bothersome and she is fully functional.  She has had no new health issues.  She has no complaints today. ROS:   14 system review of systems performed and negative with exception of short-term memory loss, dizziness only and all other systems negative  PMH:  Past Medical History:  Diagnosis Date  . 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 Arthritis   . Cataract    developing  . Femur fracture, right ([place]) 2003  . Fibroids   . Gilbert's syndrome   . History of liver biopsy nl    records of fatty liver 2008 not confirmed   . Hx of adenomatous colonic polyps   . Meniere disease    Right ear  . Osteoporosis   . Osteoporosis   . Other hemochromatosis    heterozygosity for h63d gene  . Spontaneous abortion     PSH:  Past Surgical History:  Procedure Laterality Date  . BREAST CYST EXCISION Right   . [place]  . COLONOSCOPY  220, 2004, 2007, 08/13/2010   2002: 8 mm polyp 2004: no polyps 2007: no polyps 2012: hemorrhoids  . hosp r/o mi cv right chest and jaw pain  2010  . LIVER BIOPSY     2015 negative   no fatty liver  records  2008 said  fatty liver    . OVARIAN CYST REMOVAL    . POLYPECTOMY    . spontaneously abortion on 3rd preg     triploid dna on path    Social History:  Social History   Socioeconomic History  . Marital status: Married    Spouse name: Sandria Bales  . Number of children: 2  . Years of education: Not on file  . Highest education level: Not on file  Occupational History  . Not on file  Tobacco Use  . Smoking status: Never Smoker  . Smokeless tobacco: Never Used  Vaping Use  . Vaping Use: Never used  Substance and Sexual Activity  . Alcohol use: Yes    Alcohol/week: 1.0 - 2.0 standard drink    Types: 1 - 2 Glasses of wine per week    Comment: glass of wine a week  . Drug use: No  . Sexual activity: Not on file  Other Topics Concern  . Not on file  Social History Narrative   Living alone til 2023, husband works out of town   2 daughters   Husband a [employer] director  Working 19 - 97.  Travels    Drinks 1 cup of caffeine daily   Sleep  Often disrupted.    Alcohol about once a week.   Hof 2 pet dogs   Right Handed   Social Determinants of Health   Financial Resource Strain: Low Risk   . Difficulty of Paying Living Expenses: Not hard at all  Food Insecurity:   . Worried About Charity fundraiser in the Last Year: Not on file  . Ran Out of Food in the Last Year: Not on file  Transportation Needs: No Transportation Needs  . Lack of Transportation (Medical): No  . Lack of Transportation (Non-Medical): No  Physical Activity:   . Days of Exercise per Week: Not on file  . Minutes of Exercise per Session: Not on file  Stress:   . Feeling of Stress : Not on file  Social Connections:   . Frequency of Communication with Friends and Family: Not on file  . Frequency of Social Gatherings with Friends and Family: Not on file  . Attends Religious Services: Not on file  . Active Member of Clubs or Organizations: Not on file  . Attends Archivist  Meetings: Not on file  . Marital Status: Not on file  Intimate Partner Violence:   . Fear of Current or Ex-Partner: Not on file  . Emotionally Abused: Not on file  . Physically Abused: Not on file  . Sexually Abused: Not on file    Family History:  Family History  Problem Relation Age of Onset  . Colon cancer Father        dx'd late to mid 77's  . Other Father        tinnitus  . Heart attack Father   . Depression Mother   . Anxiety disorder Mother   . Dementia Mother   . Other Mother        high calcium  . Hearing loss Mother        death  . Pulmonary disease Mother   . Stroke Other        silent  . Other Brother        elevated LFTs  . Healthy Daughter   . Healthy Daughter   . Colon polyps Neg Hx   . Esophageal cancer Neg Hx   . Rectal cancer Neg Hx   . Stomach cancer Neg Hx   . Breast cancer Neg Hx     Medications:   Current Outpatient Medications on File Prior to Visit  Medication Sig Dispense Refill  . Calcium Carb-Cholecalciferol (CALCIUM 600/VITAMIN D3) 600-800 MG-UNIT TABS Take 1 tablet by mouth in the morning and at bedtime.     . chlorthalidone (HYGROTON) 25 MG tablet TAKE 1 TABLET BY MOUTH EVERY DAY 90 tablet 0  . citalopram (CELEXA) 10 MG tablet TAKE 1 TABLET BY MOUTH EVERY DAY 90 tablet 0  . diazepam (VALIUM) 2 MG tablet Take 2 mg by mouth as needed (for Meniere's flare up).     . ondansetron (ZOFRAN-ODT) 4 MG disintegrating tablet Take 4 mg by mouth every 8 (eight) hours as needed.   1  . Turmeric 500 MG CAPS Take 1 capsule by mouth daily.     Marland Kitchen aspirin EC 81 MG tablet Take 81 mg by mouth  daily.    . cholecalciferol (VITAMIN D3) 25 MCG (1000 UT) tablet Take 1,000 Units by mouth daily.    . divalproex (DEPAKOTE ER) 500 MG 24 hr tablet Take by mouth.    Marland Kitchen LORazepam (ATIVAN) 0.5 MG tablet 1-2 po if needed for anxiety up to every 8 hours (Patient not taking: Reported on 06/15/2019) 24 tablet 0   Current Facility-Administered Medications on File Prior to  Visit  Medication Dose Route Frequency Provider Last Rate Last Admin  . 0.9 %  sodium chloride infusion  500 mL Intravenous Once Gatha Mayer, MD        Allergies:   Allergies  Allergen Reactions  . Penicillins     Did it involve swelling of the face/tongue/throat, SOB, or low BP? No Did it involve sudden or severe rash/hives, skin peeling, or any reaction on the inside of your mouth or nose? No Did you need to seek medical attention at a hospital or doctor's office? No When did it last happen? If all above answers are "NO", may proceed with cephalosporin use.     Physical Exam  Vitals:   10/27/19 1322  BP: (!) 108/54  Pulse: 67  Weight: 57.7 kg  Height: 5\' 4"  (1.626 m)   Body mass index is 21.83 kg/m. No exam data present   General: Frail pleasant middle-age Caucasian female, seated, in no evident distress Head: head normocephalic and atraumatic.   Neck: supple with no carotid or supraclavicular bruits Cardiovascular: regular rate and rhythm, no murmurs Musculoskeletal: no deformity Skin:  no rash/petichiae Vascular:  Normal pulses all extremities   Neurologic Exam Mental Status: Awake and fully alert.   Normal speech and language oriented to place and time. Recent and remote memory intact. Attention span, concentration and fund of knowledge appropriate. Mood and affect appropriate.  Cranial Nerves: Fundoscopic exam not done.  Pupils equal, briskly reactive to light. Extraocular movements full without nystagmus. Visual fields full to confrontation. Hearing intact. Facial sensation intact. Face, tongue, palate moves normally and symmetrically.  Motor: Normal bulk and tone. Normal strength in all tested extremity muscles. Sensory.: intact to touch , pinprick , position and vibratory sensation.  Coordination: Rapid alternating movements normal in all extremities. Finger-to-nose and heel-to-shin performed accurately bilaterally. Gait and Station: Arises from chair  without difficulty. Stance is normal. Gait demonstrates normal stride length and balance Reflexes: 1+ and symmetric. Toes downgoing.       Diagnostic Data (Labs, Imaging, Testing)  CT HEAD WO CONTRAST 01/12/2019 IMPRESSION: Normal head CT.  CT ANGIO HEAD W OR WO CONTRAST CT ANGIO NECK W OR WO CONTRAST 01/12/2019 IMPRESSION: 1. Normal variant CTA Circle of Willis without significant proximal stenosis, aneurysm, or branch vessel occlusion. 2. Mild atherosclerotic changes within the cavernous internal carotid arteries bilaterally without significant stenosis.  MR BRAIN WO CONTRAST 01/12/2019 IMPRESSION: Negative brain MRI  ECHOCARDIOGRAM 01/12/2019 IMPRESSIONS  1. Left ventricular ejection fraction, by visual estimation, is 55 to  60%. The left ventricle has normal function. There is no left ventricular  hypertrophy.  2. The left ventricle has no regional wall motion abnormalities.  3. Global right ventricle has normal systolic function.The right  ventricular size is normal. No increase in right ventricular wall  thickness.  4. Left atrial size was normal.  5. Right atrial size was normal.  6. The mitral valve is normal in structure. No evidence of mitral valve  regurgitation.  7. The tricuspid valve is normal in structure. Tricuspid valve  regurgitation is mild.  8. The aortic valve is normal in structure. Aortic valve regurgitation is  not visualized.  9. The pulmonic valve was grossly normal. Pulmonic valve regurgitation is  trivial.  10. Normal pulmonary artery systolic pressure.  11. The inferior vena cava is normal in size with greater than 50%  respiratory variability, suggesting right atrial pressure of 3 mmHg.   EEG adult 01/13/2019 IMPRESSION: This study is suggestive of non specific cortical dysfunction in the left temporal region. No seizures or definite epileptiform discharges were seen throughout the recording. However, only wakefulness and  drowsiness were recorded. If suspicion for interictal activity remains a concern, a prolonged study including sleep can be considered.      ASSESSMENT: Alysa Duca is a 69 y.o. year old female presented with transient speech difficulties and right hand paresthesias on 01/12/2019 likely focal seizures versus complicated migraine and initiated Depakote 500 mg ER daily.  EEG consistent with left temporal region dysfunction. Vascular risk factors include HTN and HLD.  She has been doing well since discharge in December 2020 without reoccurring transient speech or right hand symptoms  PLAN:  I had a long discussion with the patient with regards to her episodes of transient speech difficulties and paresthesias possibly complex partial seizures versus atypical migraine which appear to be quite stable and have not recurred since she has started taking Depakote ER 500 mg daily.  I recommend check continue this and she was given a refill for a year.  She will return for follow-up in the future in a year or call earlier if necessary. Greater than 50% of time during this 25 minute visit was spent on counseling, explanation of diagnosis of focal seizure versus complicated migraine,  , discussion regarding possible side effects of Depakote,planning of further management along with potential future management, and discussion with patient answered all questions to Ashley, [facility] [address] [place], [place] [address]  Phone [phone] Fax [phone] Note: This document was prepared with digital dictation and possible smart phrase technology. Any transcriptional errors that result from this process are unintentional.

## 2019-10-27 NOTE — Patient Instructions (Signed)
I had a long discussion with the patient with regards to her episodes of transient speech difficulties and paresthesias possibly complex partial seizures versus atypical migraine which appear to be quite stable and have not recurred since she has started taking Depakote ER 500 mg daily.  I recommend check continue this and she was given a refill for a year.  She will return for follow-up in the future in a year or call earlier if necessary.

## 2019-10-29 ENCOUNTER — Ambulatory Visit
Admission: RE | Admit: 2019-10-29 | Discharge: 2019-10-29 | Disposition: A | Discharge status: Home / Self Care | Payer: PPO | Source: Ambulatory Visit | Attending: Internal Medicine | Admitting: Internal Medicine

## 2019-10-29 ENCOUNTER — Other Ambulatory Visit: Payer: Self-pay

## 2019-10-29 DIAGNOSIS — Z1231 Encounter for screening mammogram for malignant neoplasm of breast: Secondary | ICD-10-CM

## 2019-12-02 ENCOUNTER — Telehealth: Payer: Self-pay | Admitting: Internal Medicine

## 2019-12-02 DIAGNOSIS — D6852 Prothrombin gene mutation: Secondary | ICD-10-CM

## 2019-12-02 DIAGNOSIS — R7989 Other specified abnormal findings of blood chemistry: Secondary | ICD-10-CM

## 2019-12-02 DIAGNOSIS — Z79899 Other long term (current) drug therapy: Secondary | ICD-10-CM

## 2019-12-02 DIAGNOSIS — R202 Paresthesia of skin: Secondary | ICD-10-CM

## 2019-12-02 DIAGNOSIS — E785 Hyperlipidemia, unspecified: Secondary | ICD-10-CM

## 2019-12-02 NOTE — Telephone Encounter (Signed)
Pt called in wanting to proceed with the consider  coronary artery scan.  Pt would like to have a call to get that set-up.

## 2019-12-03 NOTE — Telephone Encounter (Signed)
Orders placed   Please adjsut order if  Needed  for risk assessment for calcium score

## 2019-12-04 DIAGNOSIS — Z20822 Contact with and (suspected) exposure to covid-19: Secondary | ICD-10-CM | POA: Diagnosis not present

## 2019-12-08 DIAGNOSIS — Z20822 Contact with and (suspected) exposure to covid-19: Secondary | ICD-10-CM | POA: Diagnosis not present

## 2019-12-10 NOTE — Telephone Encounter (Signed)
CT scheduled for 01/07/20.

## 2019-12-24 DIAGNOSIS — H9041 Sensorineural hearing loss, unilateral, right ear, with unrestricted hearing on the contralateral side: Secondary | ICD-10-CM | POA: Diagnosis not present

## 2019-12-24 DIAGNOSIS — H903 Sensorineural hearing loss, bilateral: Secondary | ICD-10-CM | POA: Diagnosis not present

## 2019-12-24 DIAGNOSIS — H8101 Meniere's disease, right ear: Secondary | ICD-10-CM | POA: Diagnosis not present

## 2019-12-24 DIAGNOSIS — H90A21 Sensorineural hearing loss, unilateral, right ear, with restricted hearing on the contralateral side: Secondary | ICD-10-CM | POA: Diagnosis not present

## 2019-12-24 DIAGNOSIS — Z79899 Other long term (current) drug therapy: Secondary | ICD-10-CM | POA: Diagnosis not present

## 2019-12-28 ENCOUNTER — Other Ambulatory Visit: Payer: Self-pay

## 2019-12-28 ENCOUNTER — Ambulatory Visit (INDEPENDENT_AMBULATORY_CARE_PROVIDER_SITE_OTHER)
Admission: RE | Admit: 2019-12-28 | Discharge: 2019-12-28 | Disposition: A | Discharge status: Home / Self Care | Payer: PPO | Source: Ambulatory Visit | Attending: Internal Medicine | Admitting: Internal Medicine

## 2019-12-28 DIAGNOSIS — M810 Age-related osteoporosis without current pathological fracture: Secondary | ICD-10-CM

## 2019-12-28 DIAGNOSIS — E2839 Other primary ovarian failure: Secondary | ICD-10-CM

## 2020-01-07 ENCOUNTER — Ambulatory Visit (INDEPENDENT_AMBULATORY_CARE_PROVIDER_SITE_OTHER)
Admission: RE | Admit: 2020-01-07 | Discharge: 2020-01-07 | Disposition: A | Discharge status: Home / Self Care | Payer: Self-pay | Source: Ambulatory Visit | Attending: Internal Medicine | Admitting: Internal Medicine

## 2020-01-07 ENCOUNTER — Other Ambulatory Visit: Payer: Self-pay

## 2020-01-07 DIAGNOSIS — R7989 Other specified abnormal findings of blood chemistry: Secondary | ICD-10-CM

## 2020-01-07 DIAGNOSIS — D6852 Prothrombin gene mutation: Secondary | ICD-10-CM

## 2020-01-07 DIAGNOSIS — Z79899 Other long term (current) drug therapy: Secondary | ICD-10-CM

## 2020-01-07 DIAGNOSIS — R202 Paresthesia of skin: Secondary | ICD-10-CM

## 2020-01-07 DIAGNOSIS — E785 Hyperlipidemia, unspecified: Secondary | ICD-10-CM

## 2020-01-10 NOTE — Progress Notes (Signed)
Normal  scan   CAC score is 0   ,low risk  for plaque burden  .

## 2020-01-11 NOTE — Telephone Encounter (Signed)
Yes call dr Darnell Level office for bone health FU  I messaged results for ct scan but can make a virtual visit  To discuss

## 2020-01-31 ENCOUNTER — Other Ambulatory Visit: Payer: Self-pay | Admitting: Internal Medicine

## 2020-02-11 ENCOUNTER — Telehealth: Payer: Self-pay | Admitting: Internal Medicine

## 2020-02-11 NOTE — Telephone Encounter (Signed)
Left message for patient to call back and schedule Medicare Annual Wellness Visit (AWV) either virtually or in office.   Last AWV 10/13/17  please schedule at anytime with [facility] Nurse Health Advisor 1 or 2   This should be a 45 minute visit.

## 2020-03-01 NOTE — Telephone Encounter (Signed)
Sounds like carpal tunnel. She needs to try wearing wrist extension splint for carpal tunnel at night on the right hand and avoid rapid repetitive wrist flexion activities.  If this persists may consider doing EMG nerve conduction study in the office.

## 2020-04-10 ENCOUNTER — Telehealth: Payer: Self-pay | Admitting: Pharmacist

## 2020-04-10 NOTE — Chronic Care Management (AMB) (Signed)
I left the patient a message about her upcoming appointment on 04/11/2020  @ 8:30 am with the clinical pharmacist. She was asked to please have all medication on hand to review with the pharmacist.  Neita Goodnight) Mare Ferrari, [place] Assistant [phone]

## 2020-04-10 NOTE — Chronic Care Management (AMB) (Signed)
Patient returned called a few minutes later and canceled appointment. She is out of town and will call back for another appointment.    Donna Warren, [place] Assistant [phone]

## 2020-04-11 ENCOUNTER — Telehealth: Payer: PPO

## 2020-04-25 ENCOUNTER — Other Ambulatory Visit: Payer: Self-pay | Admitting: Internal Medicine

## 2020-04-27 ENCOUNTER — Ambulatory Visit: Payer: Self-pay

## 2020-04-27 ENCOUNTER — Ambulatory Visit: Payer: PPO | Admitting: Family Medicine

## 2020-04-27 ENCOUNTER — Encounter: Payer: Self-pay | Admitting: Family Medicine

## 2020-04-27 ENCOUNTER — Other Ambulatory Visit: Payer: Self-pay

## 2020-04-27 ENCOUNTER — Ambulatory Visit (INDEPENDENT_AMBULATORY_CARE_PROVIDER_SITE_OTHER): Payer: PPO

## 2020-04-27 VITALS — BP 102/70 | HR 55 | Ht 64.0 in | Wt 130.4 lb

## 2020-04-27 DIAGNOSIS — M25521 Pain in right elbow: Secondary | ICD-10-CM | POA: Diagnosis not present

## 2020-04-27 DIAGNOSIS — M7711 Lateral epicondylitis, right elbow: Secondary | ICD-10-CM | POA: Diagnosis not present

## 2020-04-27 DIAGNOSIS — M7710 Lateral epicondylitis, unspecified elbow: Secondary | ICD-10-CM | POA: Diagnosis not present

## 2020-04-27 NOTE — Assessment & Plan Note (Signed)
Patient does have marked lateral epicondylitis.  He did get better previously but now having worsening pain again.  Discussed with patient about icing regimen and home exercises.  Discussed a wrist brace which was given today.  Avoid excessive extension of the wrist.  Follow-up with me again in 4 to 6 weeks.  Worsening pain consider formal physical therapy which can be potential PRP candidate.

## 2020-04-27 NOTE — Patient Instructions (Signed)
Lateral epi exercises Brace for right elbow Vertical mouse Ergo keyboard Xray on way out Voltren gel  See me again in 6 weeks

## 2020-04-27 NOTE — Progress Notes (Signed)
[facility] [place] Phone: [phone] Subjective:   [name], am serving as a scribe for Dr. [name]. This visit occurred during the SARS-CoV-2 public health emergency.  Safety protocols were in place, including screening questions prior to the visit, additional usage of staff PPE, and extensive cleaning of exam room while observing appropriate contact time as indicated for disinfecting solutions.   I'm seeing this patient by the request  of:  Panosh, Standley Brooking, MD  CC: Right elbow pain  [id number]   11/28/2017 Believe the patient has made significant improvement.  Still have a joint effusion.  Patient is making progress notes will not change management except for encouraging a compression sleeve at this time.  Follow-up again in 6 to 8 weeks if not fully resolved  Update 04/27/2020 Reagan Behlke is a 70 y.o. female coming in with complaint of right elbow pain. Last seen in 2019 for radial head fx, R. Pain is located in same area. Patient states that within the last month when she wakes up she will have constant soreness over lateral aspect of elbow.Mornings are more painful, 5/10. No injury or changes in her work station that could have caused pain. Denies any radiating symptoms.    Past Medical History:  Diagnosis Date  . 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 Arthritis   . Cataract    developing  . Femur fracture, right ([place]) 2003  . Fibroids   . Gilbert's syndrome   . History of liver biopsy nl    records of fatty liver 2008 not confirmed   . Hx of adenomatous colonic polyps   . Meniere disease    Right ear  . Osteoporosis   . Osteoporosis   . Other hemochromatosis    heterozygosity for h63d gene  . Spontaneous abortion    Past Surgical History:  Procedure Laterality Date  . BREAST CYST EXCISION Right   . [place]  . COLONOSCOPY  220, 2004, 2007, 08/13/2010   2002: 8 mm polyp 2004: no polyps 2007: no polyps 2012: hemorrhoids  . hosp r/o mi cv right chest and jaw pain  2010  . LIVER BIOPSY     2015 negative   no fatty liver  records  2008 said fatty liver    . OVARIAN CYST REMOVAL    . POLYPECTOMY    . spontaneously abortion on 3rd preg     triploid dna on path   Social History   Socioeconomic History  . Marital status: Married    Spouse name: Sandria Bales  . Number of children: 2  . Years of education: Not on file  . Highest education level: Not on file  Occupational History  . Not on file  Tobacco Use  . Smoking status: Never Smoker  . Smokeless tobacco: Never Used  Vaping Use  . Vaping Use: Never used  Substance and Sexual Activity  . Alcohol use: Yes    Alcohol/week: 1.0 - 2.0 standard drink    Types: 1 - 2 Glasses of wine per week    Comment: glass of wine a week  . Drug use: No  . Sexual activity: Not on file  Other Topics Concern  . 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  Travels    Drinks 1 cup of caffeine daily   Sleep  Often disrupted.    Alcohol about once a week.   Hof 2 pet dogs   Right Handed   Social Determinants of Health   Financial Resource Strain: Low Risk   . Difficulty of Paying Living Expenses: Not hard at all  Food Insecurity: Not on file  Transportation Needs: No Transportation Needs  . Lack of Transportation (Medical): No  . Lack of Transportation (Non-Medical): No  Physical Activity: Not on file  Stress: Not on file  Social Connections: Not on file   Allergies  Allergen Reactions  . Penicillins     Did it involve swelling of the face/tongue/throat, SOB, or low BP? No Did it involve sudden or severe rash/hives, skin peeling, or any reaction on the inside of your mouth or nose? No Did you  need to seek medical attention at a hospital or doctor's office? No When did it last happen? If all above answers are "NO", may proceed with cephalosporin use.   Family History  Problem Relation Age of Onset  . Colon cancer Father        dx'd late to mid 22's  . Other Father        tinnitus  . Heart attack Father   . Depression Mother   . Anxiety disorder Mother   . Dementia Mother   . Other Mother        high calcium  . Hearing loss Mother        death  . Pulmonary disease Mother   . Stroke Other        silent  . Other Brother        elevated LFTs  . Healthy Daughter   . Healthy Daughter   . Colon polyps Neg Hx   . Esophageal cancer Neg Hx   . Rectal cancer Neg Hx   . Stomach cancer Neg Hx   . Breast cancer Neg Hx       Current Outpatient Medications (Cardiovascular):  .  chlorthalidone (HYGROTON) 25 MG tablet, TAKE 1 TABLET BY MOUTH EVERY DAY     Current Outpatient Medications (Analgesics):  .  aspirin EC 81 MG tablet, Take 81 mg by mouth daily.     Current Outpatient Medications (Other):  Marland Kitchen  Calcium Carb-Cholecalciferol 600-800 MG-UNIT TABS, Take 1 tablet by mouth in the morning and at bedtime.  .  cholecalciferol (VITAMIN D3) 25 MCG (1000 UT) tablet, Take 1,000 Units by mouth daily. .  citalopram (CELEXA) 10 MG tablet, TAKE 1 TABLET BY MOUTH EVERY DAY .  diazepam (VALIUM) 2 MG tablet, Take 2 mg by mouth as needed (for Meniere's flare up).  .  divalproex (DEPAKOTE ER) 500 MG 24 hr tablet, Take by mouth. .  divalproex (DEPAKOTE ER) 500 MG 24 hr tablet, Take 1 tablet (500 mg total) by mouth at bedtime. Marland Kitchen  LORazepam (ATIVAN) 0.5 MG tablet, 1-2 po if needed for anxiety up to every 8 hours .  ondansetron (ZOFRAN-ODT) 4 MG disintegrating tablet, Take 4 mg by mouth every 8 (eight) hours as needed.  .  Turmeric 500 MG CAPS, Take 1 capsule by mouth daily.   Current Facility-Administered Medications (Other):  .  0.9 %  sodium chloride infusion   Reviewed  prior external information including notes and imaging from  primary care provider As well as notes that were available from care everywhere  and other healthcare systems.  Past medical history, social, surgical and family history all reviewed in electronic medical record.  No pertanent information unless stated regarding to the chief complaint.   Review of Systems:  No headache, visual changes, nausea, vomiting, diarrhea, constipation, dizziness, abdominal pain, skin rash, fevers, chills, night sweats, weight loss, swollen lymph nodes, body aches, joint swelling, chest pain, shortness of breath, mood changes. POSITIVE muscle aches  Objective  Blood pressure 102/70, pulse (!) 55, height 5\' 4"  (1.626 m), weight 130 lb 6.4 oz (59.1 kg), SpO2 99 %.   General: No apparent distress alert and oriented x3 mood and affect normal, dressed appropriately.  HEENT: Pupils equal, extraocular movements intact  Respiratory: Patient's speak in full sentences and does not appear short of breath  Cardiovascular: No lower extremity edema, non tender, no erythema  Gait normal with good balance and coordination.  MSK: Right elbow exam shows some mild tenderness over the lateral epicondylar region.  Worsening pain with extension of the wrist.  Patient has good grip strength.  Negative Tinel's sign.  Full range of motion of the elbow noted.  Limited musculoskeletal ultrasound was performed and interpreted by Lyndal Pulley   Limited ultrasound of patient's right elbow shows there is some very mild hypoechoic changes at the insertion of the lateral epicondylar region.  No true tearing appreciated.  No cortical irregularity.  No swelling of the joint.  Impression: Lateral epicondylitis    Impression and Recommendations:     The above documentation has been reviewed and is accurate and complete Lyndal Pulley, DO

## 2020-05-17 ENCOUNTER — Other Ambulatory Visit: Payer: Self-pay | Admitting: Internal Medicine

## 2020-05-22 ENCOUNTER — Other Ambulatory Visit: Payer: Self-pay

## 2020-05-22 NOTE — Progress Notes (Signed)
Chief Complaint  Patient presents with  . Annual Exam  . Follow-up  . Medication Management    HPI: Donna Warren 70 y.o. comes in today for Preventive Medicare exam/ w visit .and a number of other issues to address.    Citalopram  Ativan and chorlhalidone rx given by this provider  Interested in weaning citalopram after think she is doing well on this.  She will travel to [place] at the end of May.   Bone health : She had bone density in December 2021 T-scores in the minus 3+ range.  Has thought about it and considering Prolia at this point plans on getting back with Dr. Cruzita Lederer to discuss.  She is taking calcium vitamin D daily.   Seizure no obvious seizure is on Depakote has follow-up in the fall with Dr. Leonie Man.  In another year or so she has the option of trying off has not decided.   Has lost hearing right  Ear  [facility]   Considering cochlear implant.  As recommended by Dr. Thornell Mule but is not sure.  Would be able to address them in years also  menieress attacks  Last 1 was about 2 months ago   Has had problem with right elbow may have had a crack related to wrist pain Dr. Tamala Julian interventions including as needed topical Voltaren.  Minder that mom took DES when pregnant with her on routine surveillance.  Remote history years ago of an abnormal Pap with some type of treatment more than 20+ years  Regard to lipids has past history of coronary artery calcium score of 0. Health Maintenance  Topic Date Due  . INFLUENZA VACCINE  08/21/2020  . MAMMOGRAM  10/28/2021  . COLONOSCOPY (Pts 45-59yrs Insurance coverage will need to be confirmed)  03/21/2023  . TETANUS/TDAP  12/06/2027  . DEXA SCAN  Completed  . COVID-19 Vaccine  Completed  . Hepatitis C Screening  Completed  . PNA vac Low Risk Adult  Completed  . HPV VACCINES  Aged Out   Health Maintenance Review LIFESTYLE:  Exercise:  Decrease  Not as much  Walking .   Lower.  Tobacco/ETS: no Alcohol:  ocass Sugar beverages:  no Sleep: 6-9 Drug use: no HH:1 +  Husband out of town   Working  Going to retire  Next year   .  17    Hearing: Decreased right ear  Vision:  No limitations at present . Last eye check UTD  Safety:  Has smoke detector and wears seat belts.  No excess sun exposure. Sees dentist regularly.  Falls: Not recently  Memory: Felt to be good  , no concern from her or her family.  Somewhat slow about "multitasking"  Depression: No anhedonia unusual crying or depressive symptoms  Nutrition: Eats well balanced diet; adequate calcium and vitamin D. No swallowing chewing problems.  Injury: no major injuries in the last six months.  Other healthcare providers:  Reviewed today .  Preventive parameters: up-to-date  Reviewed   ADLS:   There are no problems or need for assistance  driving, feeding, obtaining food, dressing, toileting and bathing, managing money using phone. She is independent.   ROS:  See HPI.  HPI   Past Medical History:  Diagnosis Date  . 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 Arthritis   . Cataract    developing  . Femur fracture, right ([place]) 2003  . Fibroids   .  Gilbert's syndrome   . History of liver biopsy nl    records of fatty liver 2008 not confirmed   . Hx of adenomatous colonic polyps   . Meniere disease    Right ear  . Osteoporosis   . Osteoporosis   . Other hemochromatosis    heterozygosity for h63d gene  . Spontaneous abortion     Family History  Problem Relation Age of Onset  . Colon cancer Father        dx'd late to mid 26's  . Other Father        tinnitus  . Heart attack Father   . Depression Mother   . Anxiety disorder Mother   . Dementia Mother   . Other Mother        high calcium  . Hearing loss Mother        death  . Pulmonary disease Mother   . Stroke Other        silent  . Other Brother        elevated LFTs  . Healthy Daughter   .  Healthy Daughter   . Colon polyps Neg Hx   . Esophageal cancer Neg Hx   . Rectal cancer Neg Hx   . Stomach cancer Neg Hx   . Breast cancer Neg Hx     Social History   Socioeconomic History  . Marital status: Married    Spouse name: Sandria Bales  . Number of children: 2  . Years of education: Not on file  . Highest education level: Not on file  Occupational History  . Not on file  Tobacco Use  . Smoking status: Never Smoker  . Smokeless tobacco: Never Used  Vaping Use  . Vaping Use: Never used  Substance and Sexual Activity  . Alcohol use: Yes    Alcohol/week: 1.0 - 2.0 standard drink    Types: 1 - 2 Glasses of wine per week    Comment: glass of wine a week  . Drug use: No  . Sexual activity: Not on file  Other Topics Concern  . Not on file  Social History Narrative   Living alone til 2023, husband works out of town   2 daughters   Husband a [place] director  Working 71 - 32.  Travels    Drinks 1 cup of caffeine daily   Sleep  Often disrupted.    Alcohol about once a week.   Hof 2 pet dogs   Right Handed   Social Determinants of Health   Financial Resource Strain: Low Risk   . Difficulty of Paying Living Expenses: Not hard at all  Food Insecurity: Not on file  Transportation Needs: No Transportation Needs  . Lack of Transportation (Medical): No  . Lack of Transportation (Non-Medical): No  Physical Activity: Not on file  Stress: Not on file  Social Connections: Not on file    Outpatient Encounter Medications as of 05/23/2020  Medication Sig  . aspirin EC 81 MG tablet Take 81 mg by mouth daily.  . Calcium Carb-Cholecalciferol 600-800 MG-UNIT TABS Take 1 tablet by mouth in the morning and at bedtime.   . chlorthalidone (HYGROTON) 25 MG tablet TAKE 1 TABLET BY MOUTH EVERY DAY  . cholecalciferol (VITAMIN D3) 25 MCG (1000 UT) tablet Take 1,000 Units by mouth daily.  . citalopram (CELEXA) 10 MG tablet TAKE 1 TABLET BY MOUTH EVERY DAY   . diazepam (VALIUM) 2 MG tablet Take  2 mg by mouth as needed (for Meniere's flare up).   . divalproex (DEPAKOTE ER) 500 MG 24 hr tablet Take by mouth.  . divalproex (DEPAKOTE ER) 500 MG 24 hr tablet Take 1 tablet (500 mg total) by mouth at bedtime.  Marland Kitchen LORazepam (ATIVAN) 0.5 MG tablet 1-2 po if needed for anxiety up to every 8 hours  . ondansetron (ZOFRAN-ODT) 4 MG disintegrating tablet Take 4 mg by mouth every 8 (eight) hours as needed.   . Turmeric 500 MG CAPS Take 1 capsule by mouth daily.    Facility-Administered Encounter Medications as of 05/23/2020  Medication  . 0.9 %  sodium chloride infusion    EXAM:  BP 100/62 (BP Location: Left Arm, Patient Position: Sitting, Cuff Size: Normal)   Pulse 60   Temp 97.9 F (36.6 C) (Oral)   Ht 5' 3.5" (1.613 m)   Wt 128 lb (58.1 kg)   SpO2 99%   BMI 22.32 kg/m   Body mass index is 22.32 kg/m.  Physical Exam: Vital signs reviewed [id number] is a well-developed well-nourished alert cooperative   who appears stated age in no acute distress.  HEENT: normocephalic atraumatic , Eyes: PERRL EOM's full, conjunctiva clear, Nares: paten,t no deformity discharge or tenderness., Ears: no deformity EAC's clear TMs with normal landmarks. Mouth:masked NECK: supple without masses, thyromegaly or bruits. CHEST/PULM:  Clear to auscultation and percussion breath sounds equal no wheeze , rales or rhonchi. No chest wall deformities or tenderness.  Breast no nodules or discharge obvious CV: PMI is nondisplaced, S1 S2 no gallops, murmurs, rubs. Peripheral pulses are full without delay.No JVD .  ABDOMEN: Bowel sounds normal nontender  No guard or rebound, no hepato splenomegal no CVA tenderness.   Extremtities:  No clubbing cyanosis or edema, no acute joint swelling or redness no focal atrophy NEURO:  Oriented x3, cranial nerves 3-12 appear to be intact, no obvious focal weakness,gait within normal limits no abnormal reflexes or asymmetrical SKIN: No acute rashes  normal turgor, color, no bruising or petechiae. PSYCH: Oriented, good eye contact, no obvious depression anxiety, cognition and judgment appear normal. LN: no cervical axillary inguinal adenopathy No noted deficits in memory, attention, and speech. External GU no acute lesions arm abnormal discoloration cervix os clear no obvious vaginal lesions palpated or visual Pap smear done collected vaginal cervical.  Lab Results  Component Value Date   WBC 4.6 02/12/2019   HGB 13.0 02/12/2019   HCT 37.8 02/12/2019   PLT 185.0 02/12/2019   GLUCOSE 92 02/12/2019   CHOL 223 (H) 09/03/2019   TRIG 71 09/03/2019   HDL 64 09/03/2019   LDLDIRECT 129.1 02/16/2013   LDLCALC 142 (H) 09/03/2019   ALT 20 09/03/2019   AST 17 09/03/2019   NA 139 02/12/2019   K 3.7 02/12/2019   CL 97 02/12/2019   CREATININE 0.93 02/12/2019   BUN 24 (H) 02/12/2019   CO2 34 (H) 02/12/2019   TSH 1.41 02/12/2019   INR 1.0 01/12/2019   HGBA1C 5.2 01/13/2019    ASSESSMENT AND PLAN:  Discussed the following assessment and plan:  Visit for preventive health examination  Medication management - Plan: Basic metabolic panel, CBC with Differential/Platelet, Hepatic function panel, Lipid panel, TSH, Iron, TIBC and Ferritin Panel, Iron, TIBC and Ferritin Panel, TSH, Lipid panel, Hepatic function panel, CBC with Differential/Platelet, Basic metabolic panel  Hyperlipidemia, unspecified hyperlipidemia type - Plan: Basic metabolic panel, CBC with Differential/Platelet, Hepatic function panel, Lipid panel, TSH, Iron, TIBC and Ferritin Panel, Iron, TIBC  and Ferritin Panel, TSH, Lipid panel, Hepatic function panel, CBC with Differential/Platelet, Basic metabolic panel  Osteoporosis without current pathological fracture, unspecified osteoporosis type - Plan: Basic metabolic panel, CBC with Differential/Platelet, Hepatic function panel, Lipid panel, TSH, Iron, TIBC and Ferritin Panel, Iron, TIBC and Ferritin Panel, TSH, Lipid panel, Hepatic  function panel, CBC with Differential/Platelet, Basic metabolic panel  Elevated homocysteine - Plan: Basic metabolic panel, CBC with Differential/Platelet, Hepatic function panel, Lipid panel, TSH, Iron, TIBC and Ferritin Panel, Iron, TIBC and Ferritin Panel, TSH, Lipid panel, Hepatic function panel, CBC with Differential/Platelet, Basic metabolic panel  Meniere's disease, unspecified laterality - Plan: Basic metabolic panel, CBC with Differential/Platelet, Hepatic function panel, Lipid panel, TSH, Iron, TIBC and Ferritin Panel, Iron, TIBC and Ferritin Panel, TSH, Lipid panel, Hepatic function panel, CBC with Differential/Platelet, Basic metabolic panel  Seizure disorder (HCC) - Plan: Basic metabolic panel, CBC with Differential/Platelet, Hepatic function panel, Lipid panel, TSH, Iron, TIBC and Ferritin Panel, Iron, TIBC and Ferritin Panel, TSH, Lipid panel, Hepatic function panel, CBC with Differential/Platelet, Basic metabolic panel  Hemochromatosis carrier - Plan: Basic metabolic panel, CBC with Differential/Platelet, Hepatic function panel, Lipid panel, TSH, Iron, TIBC and Ferritin Panel, Iron, TIBC and Ferritin Panel, TSH, Lipid panel, Hepatic function panel, CBC with Differential/Platelet, Basic metabolic panel  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 Agree with following up consideration of bone building or other therapy such as Prolia.  She can follow-up with Dr. Darnell Level as planned when she gets back from [place].  Discussed how to wean the citalopram when she comes back.  Be getting a COVID-19 booster before travel.  Patient Care Team: Autumnrose Yore, Standley Brooking, MD as PCP - General Carlean Purl Ofilia Neas, MD (Gastroenterology) Jari Pigg, MD as Consulting Physician (Dermatology) Vicie Mutters, MD as Consulting Physician (Otolaryngology) Viona Gilmore, [facility] as Pharmacist  (Pharmacist) Garvin Fila, MD as Consulting Physician (Neurology)  Patient Instructions   Can decrease  citalopram 10 5 every other day for a few weeks then  To 5 mg every day for 2-4 weeks   ,then every other day 5 mg for as long weeks before stopping.   Will notify you  of labs when available. Have a good trip ...   Health Maintenance, Female Adopting a healthy lifestyle and getting preventive care are important in promoting health and wellness. Ask your health care provider about:  The right schedule for you to have regular tests and exams.  Things you can do on your own to prevent diseases and keep yourself healthy. What should I know about diet, weight, and exercise? Eat a healthy diet  Eat a diet that includes plenty of vegetables, fruits, low-fat dairy products, and lean protein.  Do not eat a lot of foods that are high in solid fats, added sugars, or sodium.   Maintain a healthy weight Body mass index (BMI) is used to identify weight problems. It estimates body fat based on height and weight. Your health care provider can help determine your BMI and help you achieve or maintain a healthy weight. Get regular exercise Get regular exercise. This is one of the most important things you can do for your health. Most adults should:  Exercise for at least 150 minutes each week. The exercise should increase your heart rate and make you sweat (moderate-intensity exercise).  Do strengthening exercises at least twice a week. This is in addition to the  moderate-intensity exercise.  Spend less time sitting. Even light physical activity can be beneficial. Watch cholesterol and blood lipids Have your blood tested for lipids and cholesterol at 70 years of age, then have this test every 5 years. Have your cholesterol levels checked more often if:  Your lipid or cholesterol levels are high.  You are older than 70 years of age.  You are at high risk for heart disease. What should I  know about cancer screening? Depending on your health history and family history, you may need to have cancer screening at various ages. This may include screening for:  Breast cancer.  Cervical cancer.  Colorectal cancer.  Skin cancer.  Lung cancer. What should I know about heart disease, diabetes, and high blood pressure? Blood pressure and heart disease  High blood pressure causes heart disease and increases the risk of stroke. This is more likely to develop in people who have high blood pressure readings, are of African descent, or are overweight.  Have your blood pressure checked: ? Every 3-5 years if you are 45-86 years of age. ? Every year if you are 80 years old or older. Diabetes Have regular diabetes screenings. This checks your fasting blood sugar level. Have the screening done:  Once every three years after age 42 if you are at a normal weight and have a low risk for diabetes.  More often and at a younger age if you are overweight or have a high risk for diabetes. What should I know about preventing infection? Hepatitis B If you have a higher risk for hepatitis B, you should be screened for this virus. Talk with your health care provider to find out if you are at risk for hepatitis B infection. Hepatitis C Testing is recommended for:  Everyone born from [birth year] through 1965.  Anyone with known risk factors for hepatitis C. Sexually transmitted infections (STIs)  Get screened for STIs, including gonorrhea and chlamydia, if: ? You are sexually active and are younger than 70 years of age. ? You are older than 70 years of age and your health care provider tells you that you are at risk for this type of infection. ? Your sexual activity has changed since you were last screened, and you are at increased risk for chlamydia or gonorrhea. Ask your health care provider if you are at risk.  Ask your health care provider about whether you are at high risk for HIV. Your health  care provider may recommend a prescription medicine to help prevent HIV infection. If you choose to take medicine to prevent HIV, you should first get tested for HIV. You should then be tested every 3 months for as long as you are taking the medicine. Pregnancy  If you are about to stop having your period (premenopausal) and you may become pregnant, seek counseling before you get pregnant.  Take 400 to 800 micrograms (mcg) of folic acid every day if you become pregnant.  Ask for birth control (contraception) if you want to prevent pregnancy. Osteoporosis and menopause Osteoporosis is a disease in which the bones lose minerals and strength with aging. This can result in bone fractures. If you are 23 years old or older, or if you are at risk for osteoporosis and fractures, ask your health care provider if you should:  Be screened for bone loss.  Take a calcium or vitamin D supplement to lower your risk of fractures.  Be given hormone replacement therapy (HRT) to treat symptoms of menopause. Follow  these instructions at home: Lifestyle  Do not use any products that contain nicotine or tobacco, such as cigarettes, e-cigarettes, and chewing tobacco. If you need help quitting, ask your health care provider.  Do not use street drugs.  Do not share needles.  Ask your health care provider for help if you need support or information about quitting drugs. Alcohol use  Do not drink alcohol if: ? Your health care provider tells you not to drink. ? You are pregnant, may be pregnant, or are planning to become pregnant.  If you drink alcohol: ? Limit how much you use to 0-1 drink a day. ? Limit intake if you are breastfeeding.  Be aware of how much alcohol is in your drink. In the [place] one drink equals one 12 oz bottle of beer (355 mL), one 5 oz glass of wine (148 mL), or one 1 oz glass of hard liquor (44 mL). General instructions  Schedule regular health, dental, and eye exams.  Stay  current with your vaccines.  Tell your health care provider if: ? You often feel depressed. ? You have ever been abused or do not feel safe at home. Summary  Adopting a healthy lifestyle and getting preventive care are important in promoting health and wellness.  Follow your health care provider's instructions about healthy diet, exercising, and getting tested or screened for diseases.  Follow your health care provider's instructions on monitoring your cholesterol and blood pressure. This information is not intended to replace advice given to you by your health care provider. Make sure you discuss any questions you have with your health care provider. Document Revised: 12/31/2017 Document Reviewed: 12/31/2017 Elsevier Patient Education  2021 [place]. Masaji Billups M.D.

## 2020-05-23 ENCOUNTER — Encounter: Payer: Self-pay | Admitting: Internal Medicine

## 2020-05-23 ENCOUNTER — Ambulatory Visit (INDEPENDENT_AMBULATORY_CARE_PROVIDER_SITE_OTHER): Payer: PPO | Admitting: Internal Medicine

## 2020-05-23 ENCOUNTER — Other Ambulatory Visit (HOSPITAL_COMMUNITY)
Admission: RE | Admit: 2020-05-23 | Discharge: 2020-05-23 | Disposition: A | Discharge status: Home / Self Care | Payer: PPO | Source: Ambulatory Visit | Attending: Internal Medicine | Admitting: Internal Medicine

## 2020-05-23 VITALS — BP 100/62 | HR 60 | Temp 97.9°F | Ht 63.5 in | Wt 128.0 lb

## 2020-05-23 DIAGNOSIS — H905 Unspecified sensorineural hearing loss: Secondary | ICD-10-CM

## 2020-05-23 DIAGNOSIS — E785 Hyperlipidemia, unspecified: Secondary | ICD-10-CM

## 2020-05-23 DIAGNOSIS — M810 Age-related osteoporosis without current pathological fracture: Secondary | ICD-10-CM

## 2020-05-23 DIAGNOSIS — Z0000 Encounter for general adult medical examination without abnormal findings: Secondary | ICD-10-CM

## 2020-05-23 DIAGNOSIS — Z9189 Other specified personal risk factors, not elsewhere classified: Secondary | ICD-10-CM | POA: Diagnosis not present

## 2020-05-23 DIAGNOSIS — Z148 Genetic carrier of other disease: Secondary | ICD-10-CM | POA: Diagnosis not present

## 2020-05-23 DIAGNOSIS — H8109 Meniere's disease, unspecified ear: Secondary | ICD-10-CM

## 2020-05-23 DIAGNOSIS — Z79899 Other long term (current) drug therapy: Secondary | ICD-10-CM | POA: Diagnosis not present

## 2020-05-23 DIAGNOSIS — G40909 Epilepsy, unspecified, not intractable, without status epilepticus: Secondary | ICD-10-CM | POA: Diagnosis not present

## 2020-05-23 DIAGNOSIS — R7989 Other specified abnormal findings of blood chemistry: Secondary | ICD-10-CM

## 2020-05-23 LAB — CBC WITH DIFFERENTIAL/PLATELET
Basophils Absolute: 0 10*3/uL (ref 0.0–0.1)
Basophils Relative: 0.5 % (ref 0.0–3.0)
Eosinophils Absolute: 0.1 10*3/uL (ref 0.0–0.7)
Eosinophils Relative: 1.6 % (ref 0.0–5.0)
HCT: 37.3 % (ref 36.0–46.0)
Hemoglobin: 13.1 g/dL (ref 12.0–15.0)
Lymphocytes Relative: 38.8 % (ref 12.0–46.0)
Lymphs Abs: 1.7 10*3/uL (ref 0.7–4.0)
MCHC: 35.1 g/dL (ref 30.0–36.0)
MCV: 94.1 fl (ref 78.0–100.0)
Monocytes Absolute: 0.3 10*3/uL (ref 0.1–1.0)
Monocytes Relative: 7.3 % (ref 3.0–12.0)
Neutro Abs: 2.3 10*3/uL (ref 1.4–7.7)
Neutrophils Relative %: 51.8 % (ref 43.0–77.0)
Platelets: 199 10*3/uL (ref 150.0–400.0)
RBC: 3.97 Mil/uL (ref 3.87–5.11)
RDW: 12.8 % (ref 11.5–15.5)
WBC: 4.4 10*3/uL (ref 4.0–10.5)

## 2020-05-23 LAB — LIPID PANEL
Cholesterol: 232 mg/dL — ABNORMAL HIGH (ref 0–200)
HDL: 66.7 mg/dL (ref >39.00)
LDL Cholesterol: 143 mg/dL — ABNORMAL HIGH (ref 0–99)
NonHDL: 164.99
Total CHOL/HDL Ratio: 3
Triglycerides: 108 mg/dL (ref 0.0–149.0)
VLDL: 21.6 mg/dL (ref 0.0–40.0)

## 2020-05-23 LAB — HEPATIC FUNCTION PANEL
ALT: 29 U/L (ref 0–35)
AST: 25 U/L (ref 0–37)
Albumin: 4.6 g/dL (ref 3.5–5.2)
Alkaline Phosphatase: 57 U/L (ref 39–117)
Bilirubin, Direct: 0.2 mg/dL (ref 0.0–0.3)
Total Bilirubin: 1.4 mg/dL — ABNORMAL HIGH (ref 0.2–1.2)
Total Protein: 7.2 g/dL (ref 6.0–8.3)

## 2020-05-23 LAB — BASIC METABOLIC PANEL
BUN: 29 mg/dL — ABNORMAL HIGH (ref 6–23)
CO2: 34 mEq/L — ABNORMAL HIGH (ref 19–32)
Calcium: 9.8 mg/dL (ref 8.4–10.5)
Chloride: 96 mEq/L (ref 96–112)
Creatinine, Ser: 0.94 mg/dL (ref 0.40–1.20)
GFR: 61.92 mL/min (ref >60.00)
Glucose, Bld: 81 mg/dL (ref 70–99)
Potassium: 3.7 mEq/L (ref 3.5–5.1)
Sodium: 138 mEq/L (ref 135–145)

## 2020-05-23 LAB — TSH: TSH: 1.84 u[IU]/mL (ref 0.35–4.50)

## 2020-05-23 NOTE — Patient Instructions (Addendum)
Can decrease  citalopram 10 5 every other day for a few weeks then  To 5 mg every day for 2-4 weeks   ,then every other day 5 mg for as long weeks before stopping.   Will notify you  of labs when available. Have a good trip ...   Health Maintenance, Female Adopting a healthy lifestyle and getting preventive care are important in promoting health and wellness. Ask your health care provider about:  The right schedule for you to have regular tests and exams.  Things you can do on your own to prevent diseases and keep yourself healthy. What should I know about diet, weight, and exercise? Eat a healthy diet  Eat a diet that includes plenty of vegetables, fruits, low-fat dairy products, and lean protein.  Do not eat a lot of foods that are high in solid fats, added sugars, or sodium.   Maintain a healthy weight Body mass index (BMI) is used to identify weight problems. It estimates body fat based on height and weight. Your health care provider can help determine your BMI and help you achieve or maintain a healthy weight. Get regular exercise Get regular exercise. This is one of the most important things you can do for your health. Most adults should:  Exercise for at least 150 minutes each week. The exercise should increase your heart rate and make you sweat (moderate-intensity exercise).  Do strengthening exercises at least twice a week. This is in addition to the moderate-intensity exercise.  Spend less time sitting. Even light physical activity can be beneficial. Watch cholesterol and blood lipids Have your blood tested for lipids and cholesterol at 70 years of age, then have this test every 5 years. Have your cholesterol levels checked more often if:  Your lipid or cholesterol levels are high.  You are older than 70 years of age.  You are at high risk for heart disease. What should I know about cancer screening? Depending on your health history and family history, you may need to  have cancer screening at various ages. This may include screening for:  Breast cancer.  Cervical cancer.  Colorectal cancer.  Skin cancer.  Lung cancer. What should I know about heart disease, diabetes, and high blood pressure? Blood pressure and heart disease  High blood pressure causes heart disease and increases the risk of stroke. This is more likely to develop in people who have high blood pressure readings, are of African descent, or are overweight.  Have your blood pressure checked: ? Every 3-5 years if you are 39-14 years of age. ? Every year if you are 49 years old or older. Diabetes Have regular diabetes screenings. This checks your fasting blood sugar level. Have the screening done:  Once every three years after age 61 if you are at a normal weight and have a low risk for diabetes.  More often and at a younger age if you are overweight or have a high risk for diabetes. What should I know about preventing infection? Hepatitis B If you have a higher risk for hepatitis B, you should be screened for this virus. Talk with your health care provider to find out if you are at risk for hepatitis B infection. Hepatitis C Testing is recommended for:  Everyone born from [birth year] through 1965.  Anyone with known risk factors for hepatitis C. Sexually transmitted infections (STIs)  Get screened for STIs, including gonorrhea and chlamydia, if: ? You are sexually active and are younger than 70 years  of age. ? You are older than 70 years of age and your health care provider tells you that you are at risk for this type of infection. ? Your sexual activity has changed since you were last screened, and you are at increased risk for chlamydia or gonorrhea. Ask your health care provider if you are at risk.  Ask your health care provider about whether you are at high risk for HIV. Your health care provider may recommend a prescription medicine to help prevent HIV infection. If you choose to  take medicine to prevent HIV, you should first get tested for HIV. You should then be tested every 3 months for as long as you are taking the medicine. Pregnancy  If you are about to stop having your period (premenopausal) and you may become pregnant, seek counseling before you get pregnant.  Take 400 to 800 micrograms (mcg) of folic acid every day if you become pregnant.  Ask for birth control (contraception) if you want to prevent pregnancy. Osteoporosis and menopause Osteoporosis is a disease in which the bones lose minerals and strength with aging. This can result in bone fractures. If you are 70 years old or older, or if you are at risk for osteoporosis and fractures, ask your health care provider if you should:  Be screened for bone loss.  Take a calcium or vitamin D supplement to lower your risk of fractures.  Be given hormone replacement therapy (HRT) to treat symptoms of menopause. Follow these instructions at home: Lifestyle  Do not use any products that contain nicotine or tobacco, such as cigarettes, e-cigarettes, and chewing tobacco. If you need help quitting, ask your health care provider.  Do not use street drugs.  Do not share needles.  Ask your health care provider for help if you need support or information about quitting drugs. Alcohol use  Do not drink alcohol if: ? Your health care provider tells you not to drink. ? You are pregnant, may be pregnant, or are planning to become pregnant.  If you drink alcohol: ? Limit how much you use to 0-1 drink a day. ? Limit intake if you are breastfeeding.  Be aware of how much alcohol is in your drink. In the [place] one drink equals one 12 oz bottle of beer (355 mL), one 5 oz glass of wine (148 mL), or one 1 oz glass of hard liquor (44 mL). General instructions  Schedule regular health, dental, and eye exams.  Stay current with your vaccines.  Tell your health care provider if: ? You often feel depressed. ? You  have ever been abused or do not feel safe at home. Summary  Adopting a healthy lifestyle and getting preventive care are important in promoting health and wellness.  Follow your health care provider's instructions about healthy diet, exercising, and getting tested or screened for diseases.  Follow your health care provider's instructions on monitoring your cholesterol and blood pressure. This information is not intended to replace advice given to you by your health care provider. Make sure you discuss any questions you have with your health care provider. Document Revised: 12/31/2017 Document Reviewed: 12/31/2017 Elsevier Patient Education  2021 [employer].

## 2020-05-24 LAB — CYTOLOGY - PAP: Diagnosis: NEGATIVE

## 2020-05-24 LAB — IRON,TIBC AND FERRITIN PANEL
%SAT: 25 % (calc) (ref 16–45)
Ferritin: 23 ng/mL (ref 16–288)
Iron: 101 ug/dL (ref 45–160)
TIBC: 412 mcg/dL (calc) (ref 250–450)

## 2020-05-24 NOTE — Progress Notes (Signed)
Iron level is normal potassium normal out of range numbers in your chemistry is probably related to hydration and the diuretic you are on.  And kidney function is now in the normal range. Cholesterol about the same.  Because your coronary calcium score was 0 is low risk you can continue healthy lifestyle and not add a statin medication.  For primary prevention.

## 2020-05-24 NOTE — Telephone Encounter (Signed)
Do not think so. Your iron levels are not high.at all.  Which would be a common finding in hemochromatosis or iron overload

## 2020-05-24 NOTE — Telephone Encounter (Signed)
Like I said in the result note the BUN is affected by dehydration kidney problems medications and other things the creatinine is a better test of kidney function.  Sometimes diuretics can add to what we call prerenal dehydration.  Is not dangerous as long as there is good blood flow flow to the kidneys. If you are concerned  We can repeat the BMP after good hydration nonfasting. Please place order if patient wishes this.

## 2020-05-25 NOTE — Progress Notes (Signed)
Pap is normal

## 2020-05-30 ENCOUNTER — Telehealth: Payer: Self-pay | Admitting: Internal Medicine

## 2020-05-30 NOTE — Telephone Encounter (Signed)
Contact the local  health department  if this this was and active case of TB.  They may reach out to you if this was reported  Anyway .    Keep [place] informed.

## 2020-05-30 NOTE — Telephone Encounter (Signed)
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 Patient is requesting a call back to see what the provider recommends, please advise. CB is [phone]

## 2020-05-30 NOTE — Telephone Encounter (Signed)
I spoke with the patient and she stated that she spoke with Donna Beaver- Dove who is a liaison with [facility] and she informed the patient that the client she was working with has a latent case of TB and does not have active case. Patient stated she was just worried and wanted PCP to advise.

## 2020-05-30 NOTE — Telephone Encounter (Signed)
So if not an active case of TB there is no risk of infection.  Or communicable.  If she has any concerning symptoms such as cough fever sweats unintended weight loss swollen glands l needs evaluation   .

## 2020-05-31 ENCOUNTER — Telehealth: Payer: Self-pay | Admitting: Pharmacist

## 2020-05-31 NOTE — Chronic Care Management (AMB) (Signed)
Chronic Care Management Pharmacy Assistant   Name: Donna Warren  MRN: [id number] DOB: February 24, 1950   Reason for Encounter: Disease State/ General Assessment Call.    Conditions to be addressed/monitored: HLD and Meniere's Disease  Recent office visits:  05/23/20 Shanon Ace MD (PCP) - presented to clinic for physical exam and other conditions. No medication changes. Can decrease  citalopram 10 5 every other day for a few weeks then  To 5 mg every day for 2-4 weeks   ,then every other day 5 mg for as long weeks before stopping. No follow up noted.   Recent consult visits:  04/27/20 [name] DO (Sports Medicine) - presented to clinic for right elbow pain. No medication changes and follow up in 4-6 weeks.  12/24/19  Vicie Mutters MD (Audiology) - presented to clinic for Meniere's disease and hearing loss. Consider cochlear implant and follow up in 6 months with an audiogram.   Hospital visits:  None in previous 6 months  Medications: Outpatient Encounter Medications as of 05/31/2020  Medication Sig  . aspirin EC 81 MG tablet Take 81 mg by mouth daily.  . Calcium Carb-Cholecalciferol 600-800 MG-UNIT TABS Take 1 tablet by mouth in the morning and at bedtime.   . chlorthalidone (HYGROTON) 25 MG tablet TAKE 1 TABLET BY MOUTH EVERY DAY  . cholecalciferol (VITAMIN D3) 25 MCG (1000 UT) tablet Take 1,000 Units by mouth daily.  . citalopram (CELEXA) 10 MG tablet TAKE 1 TABLET BY MOUTH EVERY DAY  . diazepam (VALIUM) 2 MG tablet Take 2 mg by mouth as needed (for Meniere's flare up).   . divalproex (DEPAKOTE ER) 500 MG 24 hr tablet Take by mouth.  . divalproex (DEPAKOTE ER) 500 MG 24 hr tablet Take 1 tablet (500 mg total) by mouth at bedtime.  Marland Kitchen LORazepam (ATIVAN) 0.5 MG tablet 1-2 po if needed for anxiety up to every 8 hours  . ondansetron (ZOFRAN-ODT) 4 MG disintegrating tablet Take 4 mg by mouth every 8 (eight) hours as needed.   . Turmeric 500 MG CAPS Take 1 capsule by mouth daily.     Facility-Administered Encounter Medications as of 05/31/2020  Medication  . 0.9 %  sodium chloride infusion   Comprehensive medication review performed; Spoke to patient regarding cholesterol  Lipid Panel    Component Value Date/Time   CHOL 232 (H) 05/23/2020 1136   TRIG 108.0 05/23/2020 1136   HDL 66.70 05/23/2020 1136   LDLCALC 143 (H) 05/23/2020 1136   LDLCALC 142 (H) 09/03/2019 1104   LDLDIRECT 129.1 02/16/2013 0858    10-year ASCVD risk score: The 10-year ASCVD risk score [name] DC Brooke Bonito., et al., 2013) is: 7.1%   Values used to calculate the score:     Age: 15 years     Sex: Female     Is Non-Hispanic African American: No     Diabetic: No     Tobacco smoker: No     Systolic Blood Pressure: 109 mmHg     Is BP treated: Yes     HDL Cholesterol: 66.7 mg/dL     Total Cholesterol: 232 mg/dL  . Current antihyperlipidemic regimen:  o No current medication.   . Previous antihyperlipidemic medications tried: None.   . ASCVD risk enhancing conditions: age >64  . What recent interventions/DTPs have been made by any provider to improve Cholesterol control since last CPP Visit: None.   . Any recent hospitalizations or ED visits since last visit with CPP? No   Patient recent  coronary calcium score was 0.   . What exercise is being done to improve Cholesterol?    Adherence Review: Does the patient have >5 day gap between last estimated fill dates? No  Reviewed chart prior to disease state call. Spoke with patient regarding BP  Recent Office Vitals: BP Readings from Last 3 Encounters:  05/23/20 100/62  04/27/20 102/70  10/27/19 (!) 108/54   Pulse Readings from Last 3 Encounters:  05/23/20 60  04/27/20 (!) 55  10/27/19 67    Wt Readings from Last 3 Encounters:  05/23/20 128 lb (58.1 kg)  04/27/20 130 lb 6.4 oz (59.1 kg)  10/27/19 127 lb 3.2 oz (57.7 kg)     Kidney Function Lab Results  Component Value Date/Time   CREATININE 0.94 05/23/2020 11:36 AM    CREATININE 0.93 02/12/2019 10:25 AM   CREATININE 1.05 (H) 05/10/2016 09:18 AM   GFR 61.92 05/23/2020 11:36 AM   GFRNONAA 54 (L) 01/13/2019 10:00 AM   GFRNONAA 56 (L) 05/10/2016 09:18 AM   GFRAA >60 01/13/2019 10:00 AM   GFRAA 64 05/10/2016 09:18 AM    BMP Latest Ref Rng & Units 05/23/2020 02/12/2019 01/13/2019  Glucose 70 - 99 mg/dL 81 92 88  BUN 6 - 23 mg/dL 29(H) 24(H) 10  Creatinine 0.40 - 1.20 mg/dL 0.94 0.93 1.06(H)  Sodium 135 - 145 mEq/L 138 139 139  Potassium 3.5 - 5.1 mEq/L 3.7 3.7 3.8  Chloride 96 - 112 mEq/L 96 97 102  CO2 19 - 32 mEq/L 34(H) 34(H) 26  Calcium 8.4 - 10.5 mg/dL 9.8 10.0 9.3    . Current Meniere's Disease regimen:                      Dyazide 37.5 mg p.o. daily             Zofran 4 mg p.o. every 4 to 6 hours as needed   What recent interventions/DTPs have been made by any provider to improve Meniere's Disease control since last CPP Visit: Consideration for cochlear implant to improve hearing loss due to Meniere's. Low sodium diet less than 2000mg  per day.   . Any recent hospitalizations or ED visits since last visit with CPP? No  . What diet changes have been made to improve Meniere's Disease Control?    Marland Kitchen What exercise is being done to improve your Meniere's Disease?    Adherence Review: Is the patient currently on ACE/ARB medication? No Does the patient have >5 day gap between last estimated fill dates? No  Notes:  Multiple unsuccessful attempts to reach patient by phone.   Star Rating Drugs:  None.   [facility] [phone]

## 2020-05-31 NOTE — Telephone Encounter (Signed)
Left a message for the patient to return my call.  

## 2020-06-05 NOTE — Telephone Encounter (Cosign Needed)
3 attempts

## 2020-06-06 DIAGNOSIS — Z20822 Contact with and (suspected) exposure to covid-19: Secondary | ICD-10-CM | POA: Diagnosis not present

## 2020-06-07 NOTE — Telephone Encounter (Signed)
Patient informed of the message below.

## 2020-06-08 ENCOUNTER — Other Ambulatory Visit: Payer: Self-pay

## 2020-06-09 ENCOUNTER — Encounter: Payer: Self-pay | Admitting: Family Medicine

## 2020-06-09 ENCOUNTER — Ambulatory Visit (INDEPENDENT_AMBULATORY_CARE_PROVIDER_SITE_OTHER): Payer: PPO | Admitting: Family Medicine

## 2020-06-09 ENCOUNTER — Ambulatory Visit: Payer: PPO | Admitting: Family Medicine

## 2020-06-09 VITALS — BP 102/64 | HR 80 | Temp 98.7°F | Wt 129.0 lb

## 2020-06-09 DIAGNOSIS — R059 Cough, unspecified: Secondary | ICD-10-CM

## 2020-06-09 NOTE — Progress Notes (Signed)
   Subjective:    Patient ID: Donna Warren, female    DOB: 02/08/1950, 70 y.o.   MRN: [id number]  HPI Here for a mild tickling cough that comes and goes. This started about 10 days ago. She feels fine otherwise. No stuffy head or PND. No fever or SOB. No GERD symptoms. She has taken nothing for it. She has tested negative for the Covid virus 3 times in the past week.    Review of Systems  Constitutional: Negative.   HENT: Negative.   Eyes: Negative.   Respiratory: Positive for cough. Negative for chest tightness, shortness of breath and wheezing.   Cardiovascular: Negative.        Objective:   Physical Exam Constitutional:      Appearance: Normal appearance. She is not ill-appearing.  HENT:     Right Ear: Tympanic membrane, ear canal and external ear normal.     Left Ear: Tympanic membrane, ear canal and external ear normal.     Nose: Nose normal.     Mouth/Throat:     Pharynx: Oropharynx is clear.  Eyes:     Conjunctiva/sclera: Conjunctivae normal.  Cardiovascular:     Rate and Rhythm: Normal rate and regular rhythm.     Pulses: Normal pulses.     Heart sounds: Normal heart sounds.  Pulmonary:     Effort: Pulmonary effort is normal.     Breath sounds: Normal breath sounds.  Lymphadenopathy:     Cervical: No cervical adenopathy.  Neurological:     Mental Status: She is alert.           Assessment & Plan:  Cough, likely related to allergies. She will try taking Claritin daily and using cough ;lozenges as needed . [name], MD

## 2020-07-13 NOTE — Progress Notes (Signed)
[facility] [address] [place] Phone: [phone] Subjective:   I Kandace Blitz am serving as a Education administrator for Dr. [name].  This visit occurred during the SARS-CoV-2 public health emergency.  Safety protocols were in place, including screening questions prior to the visit, additional usage of staff PPE, and extensive cleaning of exam room while observing appropriate contact time as indicated for disinfecting solutions.   I'm seeing this patient by the request  of:  Panosh, Standley Brooking, MD  CC: Elbow pain follow-up  [id number]  04/27/2020 Patient does have marked lateral epicondylitis.  He did get better previously but now having worsening pain again.  Discussed with patient about icing regimen and home exercises.  Discussed a wrist brace which was given today.  Avoid excessive extension of the wrist.  Follow-up with me again in 4 to 6 weeks.  Worsening pain consider formal physical therapy which can be potential PRP candidate.  Update 07/14/2020 Donna Warren is a 70 y.o. female coming in with complaint of R elbow pain. Patient states the elbow is ok. States that she believes it is getting better. Doing the exercises once a week. The brace was painful in the morning so she doesn't wear it consistently but with pain she will wear it at night. Switched to a vertical mouse. States the elbow feels strained at times. Stretches arm to relieve pull and uses voltaren gel. Achy pain. When the arm gets fatigued it starts to hurt as well.  Patient states he is feeling approximately 85% better though.       Past Medical History:  Diagnosis Date   Abnormal laboratory test result elevated alpha feto protein   Adjustment reaction with anxiety and depression 06/23/2011   Arthritis    Cataract    developing   Femur fracture, right ([place]) 2003   Fibroids    Gilbert's syndrome    History of liver biopsy nl    records of fatty liver 2008 not confirmed     Hx of adenomatous colonic polyps    Meniere disease    Right ear   Osteoporosis    Osteoporosis    Other hemochromatosis    heterozygosity for h63d gene   Spontaneous abortion    Past Surgical History:  Procedure Laterality Date   BREAST CYST EXCISION Right    [place]   COLONOSCOPY  220, 2004, 2007, 08/13/2010   2002: 8 mm polyp 2004: no polyps 2007: no polyps 2012: hemorrhoids   hosp r/o mi cv right chest and jaw pain  2010   LIVER BIOPSY     2015 negative   no fatty liver  records  2008 said fatty liver     OVARIAN CYST REMOVAL     POLYPECTOMY     spontaneously abortion on 3rd preg     triploid dna on path   Social History   Socioeconomic History   Marital status: Married    Spouse name: Sandria Bales   Number of children: 2   Years of education: Not on file   Highest education level: Not on file  Occupational History   Not on file  Tobacco Use   Smoking status: Never   Smokeless tobacco: Never  Vaping Use   Vaping Use: Never used  Substance and Sexual Activity   Alcohol use: Yes    Alcohol/week: 1.0 - 2.0 standard drink    Types: 1 - 2 Glasses of wine per week  Comment: glass of wine a week   Drug use: No   Sexual activity: Not on file  Other Topics Concern   Not on file  Social History Narrative   Living alone til 2023, husband works out of town   2 daughters   Husband a [employer] director  Working 16 - 8.  Travels    Drinks 1 cup of caffeine daily   Sleep  Often disrupted.    Alcohol about once a week.   Hof 2 pet dogs   Right Handed   Social Determinants of Health   Financial Resource Strain: Low Risk    Difficulty of Paying Living Expenses: Not hard at all  Food Insecurity: Not on file  Transportation Needs: No Transportation Needs   Lack of Transportation (Medical): No   Lack of Transportation (Non-Medical): No  Physical Activity: Not on file  Stress: Not on file  Social Connections: Not on file    Allergies  Allergen Reactions   Penicillins     Did it involve swelling of the face/tongue/throat, SOB, or low BP? No Did it involve sudden or severe rash/hives, skin peeling, or any reaction on the inside of your mouth or nose? No Did you need to seek medical attention at a hospital or doctor's office? No When did it last happen?       If all above answers are "NO", may proceed with cephalosporin use.   Family History  Problem Relation Age of Onset   Colon cancer Father        dx'd late to mid 28's   Other Father        tinnitus   Heart attack Father    Depression Mother    Anxiety disorder Mother    Dementia Mother    Other Mother        high calcium   Hearing loss Mother        death   Pulmonary disease Mother    Stroke Other        silent   Other Brother        elevated LFTs   Healthy Daughter    Healthy Daughter    Colon polyps Neg Hx    Esophageal cancer Neg Hx    Rectal cancer Neg Hx    Stomach cancer Neg Hx    Breast cancer Neg Hx       Current Outpatient Medications (Cardiovascular):    chlorthalidone (HYGROTON) 25 MG tablet, TAKE 1 TABLET BY MOUTH EVERY DAY     Current Outpatient Medications (Analgesics):    aspirin EC 81 MG tablet, Take 81 mg by mouth daily.     Current Outpatient Medications (Other):    Calcium Carb-Cholecalciferol 600-800 MG-UNIT TABS, Take 1 tablet by mouth in the morning and at bedtime.    cholecalciferol (VITAMIN D3) 25 MCG (1000 UT) tablet, Take 1,000 Units by mouth daily.   citalopram (CELEXA) 10 MG tablet, TAKE 1 TABLET BY MOUTH EVERY DAY   diazepam (VALIUM) 2 MG tablet, Take 2 mg by mouth as needed (for Meniere's flare up).    divalproex (DEPAKOTE ER) 500 MG 24 hr tablet, Take by mouth.   divalproex (DEPAKOTE ER) 500 MG 24 hr tablet, Take 1 tablet (500 mg total) by mouth at bedtime.   LORazepam (ATIVAN) 0.5 MG tablet, 1-2 po if needed for anxiety up to every 8 hours   ondansetron (ZOFRAN-ODT) 4 MG disintegrating  tablet, Take 4 mg by  mouth every 8 (eight) hours as needed.    Turmeric 500 MG CAPS, Take 1 capsule by mouth daily.   Current Facility-Administered Medications (Other):    0.9 %  sodium chloride infusion   Reviewed prior external information including notes and imaging from  primary care provider As well as notes that were available from care everywhere and other healthcare systems.  Past medical history, social, surgical and family history all reviewed in electronic medical record.  No pertanent information unless stated regarding to the chief complaint.   Review of Systems:  No headache, visual changes, nausea, vomiting, diarrhea, constipation, dizziness, abdominal pain, skin rash, fevers, chills, night sweats, weight loss, swollen lymph nodes, body aches, joint swelling, chest pain, shortness of breath, mood changes. POSITIVE muscle aches  Objective  Blood pressure 100/60, pulse 67, height 5' 3.5" (1.613 m), weight 132 lb (59.9 kg), SpO2 98 %.   General: No apparent distress alert and oriented x3 mood and affect normal, dressed appropriately.  HEENT: Pupils equal, extraocular movements intact  Respiratory: Patient's speak in full sentences and does not appear short of breath  Cardiovascular: No lower extremity edema, non tender, no erythema  Gait normal with good balance and coordination.  MSK: Right elbow exam shows the patient does have good range of motion.  Very minimal tenderness over the lateral epicondylar region.  Very minimal pain with resisted extension of the wrist.  Good grip strength noted.  Full range of motion of the elbow    Impression and Recommendations:    The above documentation has been reviewed and is accurate and complete Lyndal Pulley, DO

## 2020-07-14 ENCOUNTER — Ambulatory Visit: Payer: PPO | Admitting: Family Medicine

## 2020-07-14 ENCOUNTER — Other Ambulatory Visit: Payer: Self-pay

## 2020-07-14 ENCOUNTER — Ambulatory Visit: Payer: Self-pay

## 2020-07-14 ENCOUNTER — Encounter: Payer: Self-pay | Admitting: Family Medicine

## 2020-07-14 VITALS — BP 100/60 | HR 67 | Ht 63.5 in | Wt 132.0 lb

## 2020-07-14 DIAGNOSIS — M7711 Lateral epicondylitis, right elbow: Secondary | ICD-10-CM

## 2020-07-14 DIAGNOSIS — M25521 Pain in right elbow: Secondary | ICD-10-CM

## 2020-07-14 NOTE — Patient Instructions (Signed)
Good to see you Avoid overhand lifting Increase exercises 2-3 times a week until pain is gone See me again in 2 months if not completely gone

## 2020-07-14 NOTE — Assessment & Plan Note (Signed)
Significant improvement at this time.  No significant swelling noted at the moment.  Discussed icing regimen.  Discussed still avoiding certain overhead lifting.  Follow-up again in 2 months

## 2020-07-22 DIAGNOSIS — Z20822 Contact with and (suspected) exposure to covid-19: Secondary | ICD-10-CM | POA: Diagnosis not present

## 2020-07-29 DIAGNOSIS — J329 Chronic sinusitis, unspecified: Secondary | ICD-10-CM | POA: Diagnosis not present

## 2020-07-29 DIAGNOSIS — Z20822 Contact with and (suspected) exposure to covid-19: Secondary | ICD-10-CM | POA: Diagnosis not present

## 2020-09-07 NOTE — Progress Notes (Signed)
[facility] [place] Phone: [phone] Subjective:   Donna Warren, am serving as a scribe for Dr. [name].  This visit occurred during the SARS-CoV-2 public health emergency.  Safety protocols were in place, including screening questions prior to the visit, additional usage of staff PPE, and extensive cleaning of exam room while observing appropriate contact time as indicated for disinfecting solutions.   I'm seeing this patient by the request  of:  Panosh, Standley Brooking, MD  CC: R elbow pain  [id number]  07/14/2020 Significant improvement at this time.  Warren significant swelling noted at the moment.  Discussed icing regimen.  Discussed still avoiding certain overhead lifting.  Follow-up again in 2 months  Update 09/08/2020 Donna Warren is a 70 y.o. female coming in with complaint of R elbow pain. Patient states that her elbow is sore in morning. Pronation and supination most bothersome. Did get vertical mouse but still has pain with using computer. Pain is burning in nature. Overall better but still bothersome. Is not wearing brace as this caused an increase in her pain.        Past Medical History:  Diagnosis Date   Abnormal laboratory test result elevated alpha feto protein   Adjustment reaction with anxiety and depression 06/23/2011   Arthritis    Cataract    developing   Femur fracture, right ([place]) 2003   Fibroids    Gilbert's syndrome    History of liver biopsy nl    records of fatty liver 2008 not confirmed    Hx of adenomatous colonic polyps    Meniere disease    Right ear   Osteoporosis    Osteoporosis    Other hemochromatosis    heterozygosity for h63d gene   Spontaneous abortion    Past Surgical History:  Procedure Laterality Date   BREAST CYST EXCISION Right    [place]   COLONOSCOPY  220, 2004, 2007, 08/13/2010   2002: 8 mm polyp 2004: Warren polyps 2007: Warren polyps 2012: hemorrhoids    hosp r/o mi cv right chest and jaw pain  2010   LIVER BIOPSY     2015 negative   Warren fatty liver  records  2008 said fatty liver     OVARIAN CYST REMOVAL     POLYPECTOMY     spontaneously abortion on 3rd preg     triploid dna on path   Social History   Socioeconomic History   Marital status: Married    Spouse name: Sandria Bales   Number of children: 2   Years of education: Not on file   Highest education level: Not on file  Occupational History   Not on file  Tobacco Use   Smoking status: Never   Smokeless tobacco: Never  Vaping Use   Vaping Use: Never used  Substance and Sexual Activity   Alcohol use: Yes    Alcohol/week: 1.0 - 2.0 standard drink    Types: 1 - 2 Glasses of wine per week    Comment: glass of wine a week   Drug use: Warren   Sexual activity: Not on file  Other Topics Concern   Not on file  Social History Narrative   Living alone til 2023, husband works out of town   2 daughters   Husband a [place] director  Working 32 - 33.  Travels    Drinks 1 cup of  caffeine daily   Sleep  Often disrupted.    Alcohol about once a week.   Hof 2 pet dogs   Right Handed   Social Determinants of Health   Financial Resource Strain: Low Risk    Difficulty of Paying Living Expenses: Not hard at all  Food Insecurity: Not on file  Transportation Needs: Warren Transportation Needs   Lack of Transportation (Medical): Warren   Lack of Transportation (Non-Medical): Warren  Physical Activity: Not on file  Stress: Not on file  Social Connections: Not on file   Allergies  Allergen Reactions   Penicillins     Did it involve swelling of the face/tongue/throat, SOB, or low BP? Warren Did it involve sudden or severe rash/hives, skin peeling, or any reaction on the inside of your mouth or nose? Warren Did you need to seek medical attention at a hospital or doctor's office? Warren When did it last happen?       If all above answers are "Warren", may proceed with  cephalosporin use.   Family History  Problem Relation Age of Onset   Colon cancer Father        dx'd late to mid 87's   Other Father        tinnitus   Heart attack Father    Depression Mother    Anxiety disorder Mother    Dementia Mother    Other Mother        high calcium   Hearing loss Mother        death   Pulmonary disease Mother    Stroke Other        silent   Other Brother        elevated LFTs   Healthy Daughter    Healthy Daughter    Colon polyps Neg Hx    Esophageal cancer Neg Hx    Rectal cancer Neg Hx    Stomach cancer Neg Hx    Breast cancer Neg Hx       Current Outpatient Medications (Cardiovascular):    chlorthalidone (HYGROTON) 25 MG tablet, TAKE 1 TABLET BY MOUTH EVERY DAY     Current Outpatient Medications (Analgesics):    aspirin EC 81 MG tablet, Take 81 mg by mouth daily.     Current Outpatient Medications (Other):    Calcium Carb-Cholecalciferol 600-800 MG-UNIT TABS, Take 1 tablet by mouth in the morning and at bedtime.    cholecalciferol (VITAMIN D3) 25 MCG (1000 UT) tablet, Take 1,000 Units by mouth daily.   citalopram (CELEXA) 10 MG tablet, TAKE 1 TABLET BY MOUTH EVERY DAY   diazepam (VALIUM) 2 MG tablet, Take 2 mg by mouth as needed (for Meniere's flare up).    divalproex (DEPAKOTE ER) 500 MG 24 hr tablet, Take by mouth.   divalproex (DEPAKOTE ER) 500 MG 24 hr tablet, Take 1 tablet (500 mg total) by mouth at bedtime.   LORazepam (ATIVAN) 0.5 MG tablet, 1-2 po if needed for anxiety up to every 8 hours   ondansetron (ZOFRAN-ODT) 4 MG disintegrating tablet, Take 4 mg by mouth every 8 (eight) hours as needed.    Turmeric 500 MG CAPS, Take 1 capsule by mouth daily.   Current Facility-Administered Medications (Other):    0.9 %  sodium chloride infusion   Reviewed prior external information including notes and imaging from  primary care provider As well as notes that were available from care everywhere and other healthcare systems.  Past  medical history, social, surgical and family history  all reviewed in electronic medical record.  Warren pertanent information unless stated regarding to the chief complaint.   Review of Systems:  Warren headache, visual changes, nausea, vomiting, diarrhea, constipation, dizziness, abdominal pain, skin rash, fevers, chills, night sweats, weight loss, swollen lymph nodes, body aches, joint swelling, chest pain, shortness of breath, mood changes. POSITIVE muscle aches  Objective  Blood pressure 100/62, pulse 66, height 5' 3.5" (1.613 m), weight 135 lb (61.2 kg), SpO2 99 %.   General: Warren apparent distress alert and oriented x3 mood and affect normal, dressed appropriately.  HEENT: Pupils equal, extraocular movements intact  Respiratory: Patient's speak in full sentences and does not appear short of breath  Cardiovascular: Warren lower extremity edema, non tender, Warren erythema  Gait normal with good balance and coordination.  MSK: Right elbow exam shows the patient still has some very mild tenderness over the lateral epicondylar region.  Very mild discomfort with resisted wrist extension.  Good grip strength but does have some mild discomfort behind the area of the radial head.  Patient has full supination but lacks the last 2 degrees of pronation.  Full flexion and extension.  Limited muscular skeletal ultrasound was performed and interpreted by [name], M  Limited ultrasound of patient's lateral epicondylar region shows the patient still has some mild interstitial tearing with neovascularization within the common extensor tendon sheath.  Patient does not have any retraction.  He does show improvement.  Scar tissue formation is noted more proximal to the interstitial tearing.  Warren significant hypoechoic changes. Impression: Interval healing    Impression and Recommendations:     The above documentation has been reviewed and is accurate and complete Lyndal Pulley, DO

## 2020-09-08 ENCOUNTER — Ambulatory Visit: Payer: Self-pay

## 2020-09-08 ENCOUNTER — Encounter: Payer: Self-pay | Admitting: Family Medicine

## 2020-09-08 ENCOUNTER — Other Ambulatory Visit: Payer: Self-pay

## 2020-09-08 ENCOUNTER — Ambulatory Visit (INDEPENDENT_AMBULATORY_CARE_PROVIDER_SITE_OTHER): Payer: PPO

## 2020-09-08 ENCOUNTER — Ambulatory Visit: Payer: PPO | Admitting: Family Medicine

## 2020-09-08 VITALS — BP 100/62 | HR 66 | Ht 63.5 in | Wt 135.0 lb

## 2020-09-08 DIAGNOSIS — M7711 Lateral epicondylitis, right elbow: Secondary | ICD-10-CM

## 2020-09-08 DIAGNOSIS — M25521 Pain in right elbow: Secondary | ICD-10-CM

## 2020-09-08 NOTE — Assessment & Plan Note (Signed)
Right lateral epicondylitis.  Patient has made some improvement overall.  Still has some mild discomfort.  On this side patient did have a radial head fracture and would like a repeat x-rays to further evaluate.  Patient is 85% better and encouraged to continue with conservative therapy.  We did discuss the possibility of something again in case there is any type of radicular pain that could be contributing.  Patient would like to hold on this as well.  Follow-up with me again in 3 months or as needed if completely resolved by that point

## 2020-09-08 NOTE — Patient Instructions (Signed)
Xray on way out Continue exercises [place] tool See me in 3 months

## 2020-09-11 ENCOUNTER — Ambulatory Visit: Payer: PPO | Admitting: Family Medicine

## 2020-09-21 DIAGNOSIS — H903 Sensorineural hearing loss, bilateral: Secondary | ICD-10-CM | POA: Diagnosis not present

## 2020-09-21 DIAGNOSIS — H9041 Sensorineural hearing loss, unilateral, right ear, with unrestricted hearing on the contralateral side: Secondary | ICD-10-CM | POA: Diagnosis not present

## 2020-09-21 DIAGNOSIS — H8101 Meniere's disease, right ear: Secondary | ICD-10-CM | POA: Diagnosis not present

## 2020-09-21 DIAGNOSIS — H9311 Tinnitus, right ear: Secondary | ICD-10-CM | POA: Diagnosis not present

## 2020-09-22 ENCOUNTER — Telehealth: Payer: Self-pay | Admitting: Pharmacist

## 2020-09-22 NOTE — Chronic Care Management (AMB) (Signed)
Chronic Care Management Pharmacy Assistant   Name: Donna Warren  MRN: [id number] DOB: 1950/04/17  Reason for Encounter: Disease State/ General Assessment Call.   Conditions to be addressed/monitored: HLD and Meniere's Disease.   Recent office visits:  06/09/20 Donna Penna MD [facility]) - seen for cough. No medication changes. No follow up noted.   Recent consult visits:  09/08/20 Donna Saas DO (Sports Medicine) - seen for right elbow pain. No medication changes. Follow up in 3 months.  07/29/20 Donna Warren (Urgent Care) - seen at [facility] for sore throat and hoarseness. Patient prescribed doxycycline '100mg'$  1 tablet two times daily for 7 days. A 2 day supply written for citalopram '10mg'$  daily, divalproex '500mg'$  daily and chlorthalidone '25mg'$  daily. Follow up if symptoms worsen.  07/14/20 Donna Saas DO (Sports Medicine) - seen for right elbow pain. No medication changes. Follow up in 2 months.  Hospital visits:  None in previous 6 months  Medications: Outpatient Encounter Medications as of 09/22/2020  Medication Sig   aspirin EC 81 MG tablet Take 81 mg by mouth daily.   Calcium Carb-Cholecalciferol 600-800 MG-UNIT TABS Take 1 tablet by mouth in the morning and at bedtime.    chlorthalidone (HYGROTON) 25 MG tablet TAKE 1 TABLET BY MOUTH EVERY DAY   cholecalciferol (VITAMIN D3) 25 MCG (1000 UT) tablet Take 1,000 Units by mouth daily.   citalopram (CELEXA) 10 MG tablet TAKE 1 TABLET BY MOUTH EVERY DAY   diazepam (VALIUM) 2 MG tablet Take 2 mg by mouth as needed (for Meniere's flare up).    divalproex (DEPAKOTE ER) 500 MG 24 hr tablet Take by mouth.   divalproex (DEPAKOTE ER) 500 MG 24 hr tablet Take 1 tablet (500 mg total) by mouth at bedtime.   LORazepam (ATIVAN) 0.5 MG tablet 1-2 po if needed for anxiety up to every 8 hours   ondansetron (ZOFRAN-ODT) 4 MG disintegrating tablet Take 4 mg by mouth every 8 (eight) hours as needed.    Turmeric 500 MG CAPS  Take 1 capsule by mouth daily.    Facility-Administered Encounter Medications as of 09/22/2020  Medication   0.9 %  sodium chloride infusion   Fill History: CHLORTHALIDONE 25 MG TABLET 07/29/2020 2   CITALOPRAM HBR 10 MG TABLET 07/29/2020 2   DIVALPROEX SOD DR 500 MG TAB 07/29/2020 2   DOXYCYCLINE HYCLATE 100 MG TAB 07/29/2020 7   DIAZEPAM '2MG'$  TABLET 09/21/2020 3   Comprehensive medication review performed; Spoke to patient regarding cholesterol  Lipid Panel    Component Value Date/Time   CHOL 232 (H) 05/23/2020 1136   TRIG 108.0 05/23/2020 1136   HDL 66.70 05/23/2020 1136   LDLCALC 143 (H) 05/23/2020 1136   LDLCALC 142 (H) 09/03/2019 1104   LDLDIRECT 129.1 02/16/2013 0858    10-year ASCVD risk score: The 10-year ASCVD risk score Donna Warren., et al., 2013) is: 7.1%   Values used to calculate the score:     Age: 70 years     Sex: Female     Is Non-Hispanic African American: No     Diabetic: No     Tobacco smoker: No     Systolic Blood Pressure: 123XX123 mmHg     Is BP treated: Yes     HDL Cholesterol: 66.7 mg/dL     Total Cholesterol: 232 mg/dL  Current antihyperlipidemic regimen:  No current medication. diet and exercise only. Previous antihyperlipidemic medications tried: None. ASCVD risk enhancing conditions: age >90 What recent interventions/DTPs  have been made by any provider to improve Cholesterol control since last CPP Visit: None. Any recent hospitalizations or ED visits since last visit with CPP? No  Adherence Review: Does the patient have >5 day gap between last estimated fill dates? No  Notes: Spoke with patient and reviewed all medications as prescribed. Patient reports that she is weaning off of citalopram and only taking every 3 days now. Patient reports no issues form her medications currently. Patient states that she is looking into a cochlear implant for her meniere's disease currently. Patient stated that for breakfast she tends to have some yogurt with  some almonds and a teaspoon of maple syrup or some shredded wheat and some kashi with skim milk. For lunch she usually has a peanut butter and jelly sandwich. Patient does not really eat meat but she eats a lot of fish like salmon and tuna with veggies. Patient is trying to lose some weight and she limits her salt intake due to her meniere's. Patient stated she does not drink enough water and she is working on that. She does have a sweet tooth and is working on limiting them and keeping them out of the house. She uses olive oil in place of butter and overall is making better food choices. Patient mows her lawn every Saturday and works in her garden. She also walks some throughout the week for activity. Patient scheduled follow up appointment in November as well. Patient thanked me for my call.  Care Gaps:  AWV - message sent to Donna Warren CMA to schedule. Covid-19 booster 4 - overdue since 02/13/20 Flu vaccine - due  Star Rating Drugs:  None.  [place]  Clinical Pharmacist Assistant [phone]

## 2020-09-24 ENCOUNTER — Other Ambulatory Visit: Payer: Self-pay | Admitting: Internal Medicine

## 2020-09-28 NOTE — Telephone Encounter (Cosign Needed)
2nd attempt

## 2020-09-29 ENCOUNTER — Other Ambulatory Visit: Payer: Self-pay | Admitting: Internal Medicine

## 2020-10-11 ENCOUNTER — Other Ambulatory Visit: Payer: Self-pay

## 2020-10-12 ENCOUNTER — Encounter: Payer: Self-pay | Admitting: Family Medicine

## 2020-10-12 ENCOUNTER — Ambulatory Visit (INDEPENDENT_AMBULATORY_CARE_PROVIDER_SITE_OTHER): Payer: PPO | Admitting: Family Medicine

## 2020-10-12 VITALS — BP 98/66 | HR 77 | Temp 98.0°F | Wt 136.0 lb

## 2020-10-12 DIAGNOSIS — S70362A Insect bite (nonvenomous), left thigh, initial encounter: Secondary | ICD-10-CM | POA: Diagnosis not present

## 2020-10-12 DIAGNOSIS — W57XXXA Bitten or stung by nonvenomous insect and other nonvenomous arthropods, initial encounter: Secondary | ICD-10-CM | POA: Diagnosis not present

## 2020-10-12 DIAGNOSIS — R58 Hemorrhage, not elsewhere classified: Secondary | ICD-10-CM

## 2020-10-12 MED ORDER — DOXYCYCLINE HYCLATE 100 MG PO TABS
100.0000 mg | ORAL_TABLET | Freq: Two times a day (BID) | ORAL | 0 refills | Status: AC
Start: 2020-10-12 — End: 2020-10-22

## 2020-10-12 NOTE — Progress Notes (Signed)
Subjective:    Patient ID: Donna Warren, female    DOB: [birth year], 70 y.o.   MRN: [id number]  Chief Complaint  Patient presents with   Insect Bite    Possible insect bite, on left upper leg. It mildly itchy, redness around it has darkened    HPI Patient was seen today for acute concern.  Pt with bruise/rash on L thigh x 4 days.  Noticed Monday after leg felt pruritic.  Area is a circular bruise that has become darker in color.  Pt denies injury, feeling any bites, extended time outside.  Pt has been walking her dog.  Past Medical History:  Diagnosis Date   Abnormal laboratory test result elevated alpha feto protein   Adjustment reaction with anxiety and depression 06/23/2011   Arthritis    Cataract    developing   Femur fracture, right ([place]) 2003   Fibroids    Gilbert's syndrome    History of liver biopsy nl    records of fatty liver 2008 not confirmed    Hx of adenomatous colonic polyps    Meniere disease    Right ear   Osteoporosis    Osteoporosis    Other hemochromatosis    heterozygosity for h63d gene   Spontaneous abortion     Allergies  Allergen Reactions   Penicillins     Did it involve swelling of the face/tongue/throat, SOB, or low BP? No Did it involve sudden or severe rash/hives, skin peeling, or any reaction on the inside of your mouth or nose? No Did you need to seek medical attention at a hospital or doctor's office? No When did it last happen?       If all above answers are "NO", may proceed with cephalosporin use.   ROS General: Denies fever, chills, night sweats, changes in weight, changes in appetite HEENT: Denies headaches, ear pain, changes in vision, rhinorrhea, sore throat CV: Denies CP, palpitations, SOB, orthopnea Pulm: Denies SOB, cough, wheezing GI: Denies abdominal pain, nausea, vomiting, diarrhea, constipation GU: Denies dysuria, hematuria, frequency, vaginal discharge Msk: Denies muscle cramps, joint pains Neuro: Denies weakness, numbness,  tingling Skin: Denies rashes  +bruise on L thigh Psych: Denies depression, anxiety, hallucinations    Objective:    Blood pressure 98/66, pulse 77, temperature 98 F (36.7 C), temperature source Oral, weight 136 lb (61.7 kg), SpO2 97 %.  Gen. Pleasant, well-nourished, in no distress, normal affect   HEENT: [place]/AT, face symmetric, conjunctiva clear, no scleral icterus, PERRLA, EOMI, nares patent without drainage Lungs: no accessory muscle use Cardiovascular: RRR, no peripheral edema Musculoskeletal: No deformities, no cyanosis or clubbing, normal tone Neuro:  A&Ox3, CN II-XII intact, normal gait Skin:  Warm, dry, intact.  Well circumscribed area of eccymosis with central clearing.  Central area without visible punctum and slightly firmer than surrounding tissue non tender.  Picture from today's clinic visit 10/12/20.   Picture of appearance on Monday 10/09/20 taken by pt.   Wt Readings from Last 3 Encounters:  10/12/20 136 lb (61.7 kg)  09/08/20 135 lb (61.2 kg)  07/14/20 132 lb (59.9 kg)    Lab Results  Component Value Date   WBC 4.4 05/23/2020   HGB 13.1 05/23/2020   HCT 37.3 05/23/2020   PLT 199.0 05/23/2020   GLUCOSE 81 05/23/2020   CHOL 232 (H) 05/23/2020   TRIG 108.0 05/23/2020   HDL 66.70 05/23/2020   LDLDIRECT 129.1 02/16/2013   LDLCALC 143 (H) 05/23/2020   ALT 29 05/23/2020   AST  25 05/23/2020   NA 138 05/23/2020   K 3.7 05/23/2020   CL 96 05/23/2020   CREATININE 0.94 05/23/2020   BUN 29 (H) 05/23/2020   CO2 34 (H) 05/23/2020   TSH 1.84 05/23/2020   INR 1.0 01/12/2019   HGBA1C 5.2 01/13/2019    Assessment/Plan:  Ecchymosis  Insect bite of left thigh, initial encounter  - Plan: doxycycline (VIBRA-TABS) 100 MG tablet  Discussed possible causes.  As pt does not recall injury will start ppx against possible tick borne illnesses.  Given strict precautions including limiting/avoiding sun exposure. Given handouts  F/u prn  Grier Mitts, MD

## 2020-11-08 ENCOUNTER — Ambulatory Visit: Payer: PPO | Admitting: Neurology

## 2020-11-08 ENCOUNTER — Encounter: Payer: Self-pay | Admitting: Neurology

## 2020-11-08 VITALS — BP 129/69 | HR 68 | Ht 63.0 in | Wt 136.0 lb

## 2020-11-08 DIAGNOSIS — G40009 Localization-related (focal) (partial) idiopathic epilepsy and epileptic syndromes with seizures of localized onset, not intractable, without status epilepticus: Secondary | ICD-10-CM

## 2020-11-08 MED ORDER — DIVALPROEX SODIUM ER 500 MG PO TB24: 500.0000 mg | ORAL_TABLET | Freq: Every day | ORAL | 3 refills | Status: DC

## 2020-11-08 NOTE — Progress Notes (Signed)
[facility] [address] [place]. [place] [address] [phone]       OFFICE FOLLOW UP NOTE  Ms. Donna Warren Date of Birth:  27-Nov-1950 Medical Record Number:  [id number]   Reason for Referral:  hospital stroke follow up    CHIEF COMPLAINT:  Chief Complaint  Patient presents with   Follow-up    Rm 15, alone, states she is doing well     HPI: Initial visit 02/24/2019 Donna Macho, NP ) Donna Warren being seen today for in office hospital follow-up regarding likely focal seizures versus complicated migraine on 35/57/3220.  History obtained from patient and chart review. Reviewed all radiology images and labs personally.  Ms. Donna Warren is a 70 y.o. female 70 y.o. female who presented on 01/12/2019 with transient speech difficulties and right hand paresthesias.  Evaluated by stroke team and Dr. Leonie Man with completion of stroke work-up and recurrent episodes possibly focal seizures versus complicated migraine less likely stroke or TIA.  CT head and MRI brain negative.  CTA head/neck mild arthrosclerotic changes in cavernous internal carotids bilaterally.  2D echo unremarkable.  COVID-19 negative. EEG findings are c/w left temporal region dysfunction. She was started on Depakote 500mg  ER daily and recommended repeat EEG at follow-up visit.  Recommend initiating aspirin 81 mg daily for stroke prevention.  HTN stable and recommended continuation of home dose chlorthalidone.  LDL 185 and recommended follow-up with PCP for possibly initiating statin therapy -was not done during admission due to no evidence of stroke and low suspicion for TIA.  No evidence or history of DM with A1c 5.2.  No other stroke risk factors.  No prior history of stroke.  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  Discharged home in stable condition without therapy needs.  Donna Warren is a 70 year old female who is being seen today for  hospital follow-up.  She has been doing well since discharge without reoccurring transient speech difficulties or right hand paresthesias.  She has continued on Depakote ER 500 mg daily but does have concerns regarding possible side effects including dizziness and bowel movement changes.  She does have underlying history of Mnire's disease but feels as though dizziness since starting Depakote different from Mnire's dizziness.  She has not had follow-up with otolaryngology provider Dr. Thornell Mule since discharge.  She continues on aspirin 81 mg daily without bleeding or bruising.  Blood pressure today 118/60.  She does question past year short-term memory loss with concerns of possibility of dementia or Alzheimer's and questions possible need of further testing.  She is currently working as a Education officer, museum running a Public librarian.  She does endorse increased stressors especially since COVID-19 pandemic and is a "type A" person placing increased pressure and stress on herself.  She was recently started on citalopram by PCP with some benefit.  Examples of short-term memory loss includes being told something and at times will have difficulty remembering.  She is able to maintain ADLs and all IADLs without difficulty.  No further concerns at this time.  Update 04/27/2019 : Donna Warren is seen upon her request today.  She states her main concern today is she is having dizziness but she states she has Mnire's disease which is episodic and has had dizziness in the past.  She read some of the side effects of Depakote which she is taking and wonders if it may be causing dizziness.  She describes this as feeling of  slight imbalance which is intermittent.  She denies true vertigo or significant gait or balance problems.  She is currently on Depakote ER 500 mg daily and states it is working well since she has had no further episodes of recurrent complicated migraine or seizure-like episode.  She was seen recently by my  nurse practitioner but feels she needs to see a physician.  She has no new complaints.  She remains on aspirin which is tolerating well.  Blood pressures well controlled today it is 108/72.  She had a repeat EEG on 03/04/2019 which was normal. Update 10/27/2019 : She returns for follow-up after last visit 6 months ago.  She states she is doing well.  She has had no further episodes of dizziness, numbness, seizure-like activity her typical migraines.  She is tolerating Depakote ER 500 mg daily without significant side effects.  Valproic acid level on 09/03/2019 was 36.1.  She does have Mnire's disease in the head recently undergone vestibular study.  She is still has some occasional mild dizziness but it is not as bothersome and she is fully functional.  She has had no new health issues.  She has no complaints today. Update 11/08/2020: She returns for follow-up after last visit a year ago.  She continues to do well and has not had any further recurrent episodes of speech difficulties, right arm numbness or seizure-like activity or any migraine headache like episodes.  Main complaints continues to be intermittent episodes of dizziness and vertigo and she has been diagnosed with Mnire's disease and has also noticed decreased hearing on the right.  She is seeing Dr. Hermina Barters, ENT at [facility] who plans to do cochlear implant and also surgery for Mnire's patient is not made up her mind yet and is thinking about it.  She is tolerating Depakote ER 500 mg daily quite well without any side effects.  She has no complaints today. ROS:   14 system review of systems performed and negative with exception of short-term memory loss, dizziness, decreased hearing only and all other systems negative  PMH:  Past Medical History:  Diagnosis Date   Abnormal laboratory test result elevated alpha feto protein   Adjustment reaction with anxiety and depression 06/23/2011   Arthritis    Cataract    developing   Femur  fracture, right ([place]) 2003   Fibroids    Gilbert's syndrome    History of liver biopsy nl    records of fatty liver 2008 not confirmed    Hx of adenomatous colonic polyps    Meniere disease    Right ear   Osteoporosis    Osteoporosis    Other hemochromatosis    heterozygosity for h63d gene   Spontaneous abortion     PSH:  Past Surgical History:  Procedure Laterality Date   BREAST CYST EXCISION Right    [place]   COLONOSCOPY  220, 2004, 2007, 08/13/2010   2002: 8 mm polyp 2004: no polyps 2007: no polyps 2012: hemorrhoids   hosp r/o mi cv right chest and jaw pain  2010   LIVER BIOPSY     2015 negative   no fatty liver  records  2008 said fatty liver     OVARIAN CYST REMOVAL     POLYPECTOMY     spontaneously abortion on 3rd preg     triploid dna on path    Social History:  Social History   Socioeconomic History   Marital status: Married  Spouse name: Donna Warren   Number of children: 2   Years of education: Not on file   Highest education level: Not on file  Occupational History   Not on file  Tobacco Use   Smoking status: Never   Smokeless tobacco: Never  Vaping Use   Vaping Use: Never used  Substance and Sexual Activity   Alcohol use: Yes    Alcohol/week: 1.0 - 2.0 standard drink    Types: 1 - 2 Glasses of wine per week    Comment: glass of wine a week   Drug use: No   Sexual activity: Not on file  Other Topics Concern   Not on file  Social History Narrative   Living alone til 2023, husband works out of town   2 daughters   Husband a [place] director  Working 71 - 32.  Travels    Drinks 1 cup of caffeine daily   Sleep  Often disrupted.    Alcohol about once a week.   Hof 2 pet dogs   Right Handed   Social Determinants of Health   Financial Resource Strain: Not on file  Food Insecurity: Not on file  Transportation Needs: Not on file  Physical Activity: Not on file  Stress: Not on file  Social  Connections: Not on file  Intimate Partner Violence: Not on file    Family History:  Family History  Problem Relation Age of Onset   Colon cancer Father        dx'd late to mid 44's   Other Father        tinnitus   Heart attack Father    Depression Mother    Anxiety disorder Mother    Dementia Mother    Other Mother        high calcium   Hearing loss Mother        death   Pulmonary disease Mother    Stroke Other        silent   Other Brother        elevated LFTs   Healthy Daughter    Healthy Daughter    Colon polyps Neg Hx    Esophageal cancer Neg Hx    Rectal cancer Neg Hx    Stomach cancer Neg Hx    Breast cancer Neg Hx     Medications:   Current Outpatient Medications on File Prior to Visit  Medication Sig Dispense Refill   aspirin EC 81 MG tablet Take 81 mg by mouth daily.     Calcium Carb-Cholecalciferol 600-800 MG-UNIT TABS Take 1 tablet by mouth in the morning and at bedtime.      chlorthalidone (HYGROTON) 25 MG tablet TAKE 1 TABLET BY MOUTH EVERY DAY 90 tablet 1   diazepam (VALIUM) 2 MG tablet Take 2 mg by mouth as needed (for Meniere's flare up).      ondansetron (ZOFRAN-ODT) 4 MG disintegrating tablet Take 4 mg by mouth every 8 (eight) hours as needed.   1   Turmeric 500 MG CAPS Take 1 capsule by mouth daily.      Current Facility-Administered Medications on File Prior to Visit  Medication Dose Route Frequency Provider Last Rate Last Admin   0.9 %  sodium chloride infusion  500 mL Intravenous Once Gatha Mayer, MD        Allergies:   Allergies  Allergen Reactions   Penicillins     Did it involve swelling of  the face/tongue/throat, SOB, or low BP? No Did it involve sudden or severe rash/hives, skin peeling, or any reaction on the inside of your mouth or nose? No Did you need to seek medical attention at a hospital or doctor's office? No When did it last happen?       If all above answers are "NO", may proceed with cephalosporin use.     Physical  Exam  Vitals:   11/08/20 1255  BP: 129/69  Pulse: 68  Weight: 136 lb (61.7 kg)  Height: 5\' 3"  (1.6 m)   Body mass index is 24.09 kg/m. No results found.   General: Frail pleasant middle-age Caucasian female, seated, in no evident distress Head: head normocephalic and atraumatic.   Neck: supple with no carotid or supraclavicular bruits Cardiovascular: regular rate and rhythm, no murmurs Musculoskeletal: no deformity Skin:  no rash/petichiae Vascular:  Normal pulses all extremities   Neurologic Exam Mental Status: Awake and fully alert.   Normal speech and language oriented to place and time. Recent and remote memory intact. Attention span, concentration and fund of knowledge appropriate. Mood and affect appropriate.  Cranial Nerves: Fundoscopic exam not done.  Pupils equal, briskly reactive to light. Extraocular movements full without nystagmus. Visual fields full to confrontation. Hearing intact. Facial sensation intact. Face, tongue, palate moves normally and symmetrically.  Motor: Normal bulk and tone. Normal strength in all tested extremity muscles. Sensory.: intact to touch , pinprick , position and vibratory sensation.  Coordination: Rapid alternating movements normal in all extremities. Finger-to-nose and heel-to-shin performed accurately bilaterally. Gait and Station: Arises from chair without difficulty. Stance is normal. Gait demonstrates normal stride length and balance Reflexes: 1+ and symmetric. Toes downgoing.       Diagnostic Data (Labs, Imaging, Testing)  CT HEAD WO CONTRAST 01/12/2019 IMPRESSION: Normal head CT.  CT ANGIO HEAD W OR WO CONTRAST CT ANGIO NECK W OR WO CONTRAST 01/12/2019 IMPRESSION: 1. Normal variant CTA Circle of Willis without significant proximal stenosis, aneurysm, or branch vessel occlusion. 2. Mild atherosclerotic changes within the cavernous internal carotid arteries bilaterally without significant stenosis.  MR BRAIN WO  CONTRAST 01/12/2019 IMPRESSION: Negative brain MRI  ECHOCARDIOGRAM 01/12/2019 IMPRESSIONS   1. Left ventricular ejection fraction, by visual estimation, is 55 to  60%. The left ventricle has normal function. There is no left ventricular  hypertrophy.   2. The left ventricle has no regional wall motion abnormalities.   3. Global right ventricle has normal systolic function.The right  ventricular size is normal. No increase in right ventricular wall  thickness.   4. Left atrial size was normal.   5. Right atrial size was normal.   6. The mitral valve is normal in structure. No evidence of mitral valve  regurgitation.   7. The tricuspid valve is normal in structure. Tricuspid valve  regurgitation is mild.   8. The aortic valve is normal in structure. Aortic valve regurgitation is  not visualized.   9. The pulmonic valve was grossly normal. Pulmonic valve regurgitation is  trivial.  10. Normal pulmonary artery systolic pressure.  11. The inferior vena cava is normal in size with greater than 50%  respiratory variability, suggesting right atrial pressure of 3 mmHg.   EEG adult 01/13/2019 IMPRESSION: This study is suggestive of non specific cortical dysfunction in the left temporal region. No seizures or definite epileptiform discharges were seen throughout the recording. However, only wakefulness and drowsiness were recorded. If suspicion for interictal activity remains a concern, a prolonged study including  sleep can be considered.      ASSESSMENT: Haliey Romberg is a 70 y.o. year old female presented with transient speech difficulties and right hand paresthesias on 01/12/2019 likely focal seizures versus complicated migraine and initiated Depakote 500 mg ER daily.  EEG consistent with left temporal region dysfunction. Vascular risk factors include HTN and HLD.  She has been doing well since discharge in December 2020 without reoccurring transient speech or right hand  symptoms  PLAN:  I had a long discussion the patient with regards to her episodes of speech difficulties and right upper extremity paresthesias and partial seizure which appear quite stable on the current medication regimen of Depakote ER 500 mg at bedtime.  She was given a refill for ER and advised to follow-up in a year or call earlier if necessary.  She was advised to follow-up with Dr. Thornell Mule for her Mnire's syndrome and cochlear implant surgery. Greater than 50% of time during this 25 minute visit was spent on counseling, explanation of diagnosis of focal seizure versus complicated migraine,  , discussion regarding possible side effects of Depakote,planning of further management along with potential future management, and discussion with patient answered all questions to [place], [facility] [address] [place], [place] [address]  Phone [phone] Fax [phone] Note: This document was prepared with digital dictation and possible smart phrase technology. Any transcriptional errors that result from this process are unintentional.

## 2020-11-08 NOTE — Patient Instructions (Addendum)
I had a long discussion the patient with regards to her episodes of speech difficulties and right upper extremity paresthesias and partial seizure which appear quite stable on the current medication regimen of Depakote ER 500 mg at bedtime.  She was given a refill for ER and advised to follow-up in a year or call earlier if necessary.  She was advised to follow-up with Dr. Thornell Mule for her Mnire's syndrome and cochlear implant surgery.

## 2020-11-14 DIAGNOSIS — J383 Other diseases of vocal cords: Secondary | ICD-10-CM | POA: Diagnosis not present

## 2020-11-14 DIAGNOSIS — R490 Dysphonia: Secondary | ICD-10-CM | POA: Diagnosis not present

## 2020-11-14 DIAGNOSIS — J385 Laryngeal spasm: Secondary | ICD-10-CM | POA: Diagnosis not present

## 2020-12-06 DIAGNOSIS — H903 Sensorineural hearing loss, bilateral: Secondary | ICD-10-CM | POA: Diagnosis not present

## 2020-12-06 DIAGNOSIS — H9313 Tinnitus, bilateral: Secondary | ICD-10-CM | POA: Diagnosis not present

## 2020-12-08 DIAGNOSIS — J383 Other diseases of vocal cords: Secondary | ICD-10-CM | POA: Diagnosis not present

## 2020-12-08 DIAGNOSIS — J385 Laryngeal spasm: Secondary | ICD-10-CM | POA: Diagnosis not present

## 2020-12-08 DIAGNOSIS — R490 Dysphonia: Secondary | ICD-10-CM | POA: Diagnosis not present

## 2020-12-18 ENCOUNTER — Telehealth: Payer: Self-pay | Admitting: Pharmacist

## 2020-12-18 NOTE — Chronic Care Management (AMB) (Signed)
    Chronic Care Management Pharmacy Assistant   Name: Donna Warren  MRN: [id number] DOB: 08/25/1950    12/18/20 APPOINTMENT REMINDER   Called Patient No answer, left message of appointment on 12/19/20 at 3:30 via telephone visit with Jeni Salles, Pharm D.   Notified to have all medications, supplements, blood pressure and/or blood sugar logs available during appointment and to return call if need to reschedule.   Care Gaps: AWV- 9/2 office aware to sched COVID Booster - Overdue BP- 129/69  Star Rating Drug: None   Any gaps in medications fill history?        Medications: Outpatient Encounter Medications as of 12/18/2020  Medication Sig   aspirin EC 81 MG tablet Take 81 mg by mouth daily.   Calcium Carb-Cholecalciferol 600-800 MG-UNIT TABS Take 1 tablet by mouth in the morning and at bedtime.    chlorthalidone (HYGROTON) 25 MG tablet TAKE 1 TABLET BY MOUTH EVERY DAY   diazepam (VALIUM) 2 MG tablet Take 2 mg by mouth as needed (for Meniere's flare up).    divalproex (DEPAKOTE ER) 500 MG 24 hr tablet Take 1 tablet (500 mg total) by mouth at bedtime.   ondansetron (ZOFRAN-ODT) 4 MG disintegrating tablet Take 4 mg by mouth every 8 (eight) hours as needed.    Turmeric 500 MG CAPS Take 1 capsule by mouth daily.    Facility-Administered Encounter Medications as of 12/18/2020  Medication   0.9 %  sodium chloride infusion       Ned Clines [place] Clinical Pharmacist Assistant [phone]

## 2020-12-18 NOTE — Progress Notes (Signed)
Patient returned call states she is out of town with her family and does not wish to reschedule at this time. She reports she will call for an appointment when she has time.    [place] Clinical Pharmacist Assistant [phone]

## 2020-12-19 ENCOUNTER — Telehealth: Payer: PPO

## 2020-12-20 ENCOUNTER — Telehealth: Payer: Self-pay | Admitting: Pharmacist

## 2020-12-20 NOTE — Progress Notes (Signed)
error 

## 2020-12-20 NOTE — Progress Notes (Signed)
[facility] [place] Phone: [phone] Subjective:   Donna Warren, am serving as a scribe for Dr. [name].  This visit occurred during the SARS-CoV-2 public health emergency.  Safety protocols were in place, including screening questions prior to the visit, additional usage of staff PPE, and extensive cleaning of exam room while observing appropriate contact time as indicated for disinfecting solutions.    I'm seeing this patient by the request  of:  Panosh, Standley Brooking, MD  CC: Right elbow pain follow-up  ATF:[id number]   09/08/2020 Right lateral epicondylitis.  Patient has made some improvement overall.  Still has some mild discomfort.  On this side patient did have a radial head fracture and would like a repeat x-rays to further evaluate.  Patient is 85% better and encouraged to continue with conservative therapy.  We did discuss the possibility of something again in case there is any type of radicular pain that could be contributing.  Patient would like to hold on this as well.  Follow-up with me again in 3 months or as needed if completely resolved by that point  Update 12/22/2020 Donna Warren is a 70 y.o. female coming in with complaint of R elbow pain. (-) xray on 09/08/2020. Patient states that she is doing much better. Notes stiffness in the mornings that loosens up as day goes on. Vertical mouse has helped.  Would state that overall not having any difficulty except for the stiffness.       Past Medical History:  Diagnosis Date   Abnormal laboratory test result elevated alpha feto protein   Adjustment reaction with anxiety and depression 06/23/2011   Arthritis    Cataract    developing   Femur fracture, right ([place]) 2003   Fibroids    Gilbert's syndrome    History of liver biopsy nl    records of fatty liver 2008 not confirmed    Hx of adenomatous colonic polyps    Meniere disease    Right ear    Osteoporosis    Osteoporosis    Other hemochromatosis    heterozygosity for h63d gene   Spontaneous abortion    Past Surgical History:  Procedure Laterality Date   BREAST CYST EXCISION Right    [place]   COLONOSCOPY  220, 2004, 2007, 08/13/2010   2002: 8 mm polyp 2004: Warren polyps 2007: Warren polyps 2012: hemorrhoids   hosp r/o mi cv right chest and jaw pain  2010   LIVER BIOPSY     2015 negative   Warren fatty liver  records  2008 said fatty liver     OVARIAN CYST REMOVAL     POLYPECTOMY     spontaneously abortion on 3rd preg     triploid dna on path   Social History   Socioeconomic History   Marital status: Married    Spouse name: Sandria Bales   Number of children: 2   Years of education: Not on file   Highest education level: Not on file  Occupational History   Not on file  Tobacco Use   Smoking status: Never   Smokeless tobacco: Never  Vaping Use   Vaping Use: Never used  Substance and Sexual Activity   Alcohol use: Yes    Alcohol/week: 1.0 - 2.0 standard drink    Types: 1 - 2 Glasses of wine per week    Comment: glass of wine a week   Drug use:  Warren   Sexual activity: Not on file  Other Topics Concern   Not on file  Social History Narrative   Living alone til 2023, husband works out of town   2 daughters   Husband a [employer] director  Working 78 - 54.  Travels    Drinks 1 cup of caffeine daily   Sleep  Often disrupted.    Alcohol about once a week.   Hof 2 pet dogs   Right Handed   Social Determinants of Health   Financial Resource Strain: Not on file  Food Insecurity: Not on file  Transportation Needs: Not on file  Physical Activity: Not on file  Stress: Not on file  Social Connections: Not on file   Allergies  Allergen Reactions   Penicillins     Did it involve swelling of the face/tongue/throat, SOB, or low BP? Warren Did it involve sudden or severe rash/hives, skin peeling, or any reaction on the inside of your  mouth or nose? Warren Did you need to seek medical attention at a hospital or doctor's office? Warren When did it last happen?       If all above answers are "Warren", may proceed with cephalosporin use.   Family History  Problem Relation Age of Onset   Colon cancer Father        dx'd late to mid 31's   Other Father        tinnitus   Heart attack Father    Depression Mother    Anxiety disorder Mother    Dementia Mother    Other Mother        high calcium   Hearing loss Mother        death   Pulmonary disease Mother    Stroke Other        silent   Other Brother        elevated LFTs   Healthy Daughter    Healthy Daughter    Colon polyps Neg Hx    Esophageal cancer Neg Hx    Rectal cancer Neg Hx    Stomach cancer Neg Hx    Breast cancer Neg Hx       Current Outpatient Medications (Cardiovascular):    chlorthalidone (HYGROTON) 25 MG tablet, TAKE 1 TABLET BY MOUTH EVERY DAY     Current Outpatient Medications (Analgesics):    aspirin EC 81 MG tablet, Take 81 mg by mouth daily.     Current Outpatient Medications (Other):    Calcium Carb-Cholecalciferol 600-800 MG-UNIT TABS, Take 1 tablet by mouth in the morning and at bedtime.    diazepam (VALIUM) 2 MG tablet, Take 2 mg by mouth as needed (for Meniere's flare up).    divalproex (DEPAKOTE ER) 500 MG 24 hr tablet, Take 1 tablet (500 mg total) by mouth at bedtime.   ondansetron (ZOFRAN-ODT) 4 MG disintegrating tablet, Take 4 mg by mouth every 8 (eight) hours as needed.    Turmeric 500 MG CAPS, Take 1 capsule by mouth daily.   Current Facility-Administered Medications (Other):    0.9 %  sodium chloride infusion     Objective  Blood pressure 98/64, pulse 88, height 5\' 3"  (1.6 m), weight 135 lb (61.2 kg), SpO2 99 %.   General: Warren apparent distress alert and oriented x3 mood and affect normal, dressed appropriately.  HEENT: Pupils equal, extraocular movements intact  Respiratory: Patient's speak in full sentences and does not  appear short of breath  Cardiovascular: Warren lower extremity edema, non tender, Warren erythema  Gait normal with good balance and coordination.  MSK: Right elbow exam shows the patient has Warren tenderness on exam.  Warren pain with resisted extension of the wrist.  Limited muscular skeletal ultrasound was performed and interpreted by [name], M  Limited ultrasound shows Warren significant inflammation.  Tendon appears to be unremarkable.  Warren cortical irregularity noted to the lateral epicondylar region. Impression: Normal common extensor tendon    Impression and Recommendations:     The above documentation has been reviewed and is accurate and complete Lyndal Pulley, DO

## 2020-12-22 ENCOUNTER — Ambulatory Visit: Payer: Self-pay

## 2020-12-22 ENCOUNTER — Other Ambulatory Visit: Payer: Self-pay

## 2020-12-22 ENCOUNTER — Ambulatory Visit: Payer: PPO | Admitting: Family Medicine

## 2020-12-22 ENCOUNTER — Encounter: Payer: Self-pay | Admitting: Family Medicine

## 2020-12-22 VITALS — BP 98/64 | HR 88 | Ht 63.0 in | Wt 135.0 lb

## 2020-12-22 DIAGNOSIS — M7711 Lateral epicondylitis, right elbow: Secondary | ICD-10-CM | POA: Diagnosis not present

## 2020-12-22 NOTE — Assessment & Plan Note (Signed)
Patient is doing significantly better at this time.  On ultrasound did not show any significant inflammation or tearing noted.  At this point I think patient is fully healed.  Discussed what to do in case of flares with follow-up with me as needed.

## 2020-12-22 NOTE — Patient Instructions (Signed)
Any flares 3 IBU 3x a day for 3 days Ice 20 min Brace at night for one week Enjoy piano, try stool instead of bench See me when you need me

## 2020-12-28 DIAGNOSIS — H40023 Open angle with borderline findings, high risk, bilateral: Secondary | ICD-10-CM | POA: Diagnosis not present

## 2020-12-28 DIAGNOSIS — H5213 Myopia, bilateral: Secondary | ICD-10-CM | POA: Diagnosis not present

## 2020-12-28 DIAGNOSIS — H401432 Capsular glaucoma with pseudoexfoliation of lens, bilateral, moderate stage: Secondary | ICD-10-CM | POA: Diagnosis not present

## 2021-01-05 DIAGNOSIS — R490 Dysphonia: Secondary | ICD-10-CM | POA: Diagnosis not present

## 2021-01-05 DIAGNOSIS — J383 Other diseases of vocal cords: Secondary | ICD-10-CM | POA: Diagnosis not present

## 2021-01-05 DIAGNOSIS — J385 Laryngeal spasm: Secondary | ICD-10-CM | POA: Diagnosis not present

## 2021-02-22 DIAGNOSIS — H9313 Tinnitus, bilateral: Secondary | ICD-10-CM | POA: Diagnosis not present

## 2021-02-22 DIAGNOSIS — H9041 Sensorineural hearing loss, unilateral, right ear, with unrestricted hearing on the contralateral side: Secondary | ICD-10-CM | POA: Diagnosis not present

## 2021-02-22 DIAGNOSIS — H903 Sensorineural hearing loss, bilateral: Secondary | ICD-10-CM | POA: Diagnosis not present

## 2021-02-22 DIAGNOSIS — H8101 Meniere's disease, right ear: Secondary | ICD-10-CM | POA: Diagnosis not present

## 2021-02-22 DIAGNOSIS — H9311 Tinnitus, right ear: Secondary | ICD-10-CM | POA: Diagnosis not present

## 2021-03-13 ENCOUNTER — Other Ambulatory Visit: Payer: Self-pay | Admitting: Internal Medicine

## 2021-03-13 ENCOUNTER — Telehealth: Payer: Self-pay | Admitting: Internal Medicine

## 2021-03-13 DIAGNOSIS — D2230 Melanocytic nevi of unspecified part of face: Secondary | ICD-10-CM | POA: Diagnosis not present

## 2021-03-13 DIAGNOSIS — L821 Other seborrheic keratosis: Secondary | ICD-10-CM | POA: Diagnosis not present

## 2021-03-13 DIAGNOSIS — L578 Other skin changes due to chronic exposure to nonionizing radiation: Secondary | ICD-10-CM | POA: Diagnosis not present

## 2021-03-13 DIAGNOSIS — Z1231 Encounter for screening mammogram for malignant neoplasm of breast: Secondary | ICD-10-CM

## 2021-03-13 DIAGNOSIS — D2272 Melanocytic nevi of left lower limb, including hip: Secondary | ICD-10-CM | POA: Diagnosis not present

## 2021-03-13 DIAGNOSIS — E785 Hyperlipidemia, unspecified: Secondary | ICD-10-CM

## 2021-03-13 DIAGNOSIS — L738 Other specified follicular disorders: Secondary | ICD-10-CM | POA: Diagnosis not present

## 2021-03-13 DIAGNOSIS — I10 Essential (primary) hypertension: Secondary | ICD-10-CM

## 2021-03-13 DIAGNOSIS — D225 Melanocytic nevi of trunk: Secondary | ICD-10-CM | POA: Diagnosis not present

## 2021-03-13 DIAGNOSIS — H8109 Meniere's disease, unspecified ear: Secondary | ICD-10-CM

## 2021-03-13 DIAGNOSIS — Z79899 Other long term (current) drug therapy: Secondary | ICD-10-CM

## 2021-03-13 DIAGNOSIS — D2261 Melanocytic nevi of right upper limb, including shoulder: Secondary | ICD-10-CM | POA: Diagnosis not present

## 2021-03-13 NOTE — Telephone Encounter (Signed)
Patient is requesting to have lipid labs ordered because she haven't had any done since May of 2022. Patient states that she's worried because her lab results were high in May.  I offered patient an appointment to discuss this with Dr.Panosh but she declined offer.  Please advise.

## 2021-03-13 NOTE — Telephone Encounter (Signed)
Future lab orders placed lipid lpa bmp and lfts .cbcd She can get fasting lab appt and then make fu for results or when due for cpx

## 2021-03-14 NOTE — Telephone Encounter (Signed)
Pt informed of the message and verbalized understanding  

## 2021-03-16 ENCOUNTER — Other Ambulatory Visit: Payer: Self-pay

## 2021-03-16 ENCOUNTER — Ambulatory Visit
Admission: RE | Admit: 2021-03-16 | Discharge: 2021-03-16 | Disposition: A | Discharge status: Home / Self Care | Payer: PPO | Source: Ambulatory Visit | Attending: Internal Medicine | Admitting: Internal Medicine

## 2021-03-16 DIAGNOSIS — Z1231 Encounter for screening mammogram for malignant neoplasm of breast: Secondary | ICD-10-CM | POA: Diagnosis not present

## 2021-03-23 ENCOUNTER — Other Ambulatory Visit: Payer: Self-pay

## 2021-03-23 ENCOUNTER — Other Ambulatory Visit (INDEPENDENT_AMBULATORY_CARE_PROVIDER_SITE_OTHER): Payer: PPO

## 2021-03-23 DIAGNOSIS — H8109 Meniere's disease, unspecified ear: Secondary | ICD-10-CM

## 2021-03-23 DIAGNOSIS — E785 Hyperlipidemia, unspecified: Secondary | ICD-10-CM | POA: Diagnosis not present

## 2021-03-23 DIAGNOSIS — I10 Essential (primary) hypertension: Secondary | ICD-10-CM

## 2021-03-23 DIAGNOSIS — Z79899 Other long term (current) drug therapy: Secondary | ICD-10-CM | POA: Diagnosis not present

## 2021-03-23 LAB — CBC WITH DIFFERENTIAL/PLATELET
Basophils Absolute: 0 10*3/uL (ref 0.0–0.1)
Basophils Relative: 0.6 % (ref 0.0–3.0)
Eosinophils Absolute: 0.1 10*3/uL (ref 0.0–0.7)
Eosinophils Relative: 2.3 % (ref 0.0–5.0)
HCT: 36.7 % (ref 36.0–46.0)
Hemoglobin: 12.9 g/dL (ref 12.0–15.0)
Lymphocytes Relative: 38.6 % (ref 12.0–46.0)
Lymphs Abs: 1.6 10*3/uL (ref 0.7–4.0)
MCHC: 35.1 g/dL (ref 30.0–36.0)
MCV: 92.4 fl (ref 78.0–100.0)
Monocytes Absolute: 0.3 10*3/uL (ref 0.1–1.0)
Monocytes Relative: 7.4 % (ref 3.0–12.0)
Neutro Abs: 2.1 10*3/uL (ref 1.4–7.7)
Neutrophils Relative %: 51.1 % (ref 43.0–77.0)
Platelets: 233 10*3/uL (ref 150.0–400.0)
RBC: 3.98 Mil/uL (ref 3.87–5.11)
RDW: 13.3 % (ref 11.5–15.5)
WBC: 4.1 10*3/uL (ref 4.0–10.5)

## 2021-03-23 LAB — BASIC METABOLIC PANEL
BUN: 26 mg/dL — ABNORMAL HIGH (ref 6–23)
CO2: 34 mEq/L — ABNORMAL HIGH (ref 19–32)
Calcium: 10.3 mg/dL (ref 8.4–10.5)
Chloride: 97 mEq/L (ref 96–112)
Creatinine, Ser: 1.03 mg/dL (ref 0.40–1.20)
GFR: 55.16 mL/min — ABNORMAL LOW (ref >60.00)
Glucose, Bld: 90 mg/dL (ref 70–99)
Potassium: 3.3 mEq/L — ABNORMAL LOW (ref 3.5–5.1)
Sodium: 139 mEq/L (ref 135–145)

## 2021-03-23 LAB — LIPID PANEL
Cholesterol: 233 mg/dL — ABNORMAL HIGH (ref 0–200)
HDL: 79.2 mg/dL (ref >39.00)
LDL Cholesterol: 136 mg/dL — ABNORMAL HIGH (ref 0–99)
NonHDL: 153.74
Total CHOL/HDL Ratio: 3
Triglycerides: 89 mg/dL (ref 0.0–149.0)
VLDL: 17.8 mg/dL (ref 0.0–40.0)

## 2021-03-23 LAB — HEPATIC FUNCTION PANEL
ALT: 24 U/L (ref 0–35)
AST: 25 U/L (ref 0–37)
Albumin: 4.6 g/dL (ref 3.5–5.2)
Alkaline Phosphatase: 63 U/L (ref 39–117)
Bilirubin, Direct: 0.1 mg/dL (ref 0.0–0.3)
Total Bilirubin: 1.2 mg/dL (ref 0.2–1.2)
Total Protein: 7.7 g/dL (ref 6.0–8.3)

## 2021-03-24 ENCOUNTER — Other Ambulatory Visit: Payer: Self-pay | Admitting: Internal Medicine

## 2021-03-28 LAB — LIPOPROTEIN A (LPA): Lipoprotein (a): <10 nmol/L (ref <75)

## 2021-03-30 ENCOUNTER — Encounter: Payer: Self-pay | Admitting: Internal Medicine

## 2021-04-02 NOTE — Telephone Encounter (Signed)
See result note for assessment and actions needed ?

## 2021-04-02 NOTE — Progress Notes (Signed)
So kidney function is a about the same range over the last year and should be followed yearly  (  BUN is just associated with a creatinine and not a more defined marker.) ?Potassium has dropped like in past,suspecting it is from the diuretic chlorthalidone which can cause low potassium ?Suggest we add a potassium supplement to correct this or we can try to increase potassium rich foods and see if potassium improves after 3 weeks of implementation. ?Cholesterol is about the same    Lipoprotein a is not high risk  ?The 10-year ASCVD risk score (Arnett DK, et al., 2019) is: 10.2% ?  Values used to calculate the score: ?    Age: 71 years ?    Sex: Female ?    Is Non-Hispanic African American: No ?    Diabetic: No ?    Tobacco smoker: No ?    Systolic Blood Pressure: 694 mmHg ?    Is BP treated: Yes ?    HDL Cholesterol: 79.2 mg/dL ?    Total Cholesterol: 233 mg/dL ?Donna Warren  ?Please order BMP and magnesium level in about 3 weeks on intervention.  ?(And let us know if   we need to send in potassiium KCL  disp 30 refill x 1  take 1 po qd ) ?

## 2021-04-03 ENCOUNTER — Other Ambulatory Visit: Payer: Self-pay

## 2021-04-03 DIAGNOSIS — E876 Hypokalemia: Secondary | ICD-10-CM

## 2021-04-03 DIAGNOSIS — Z79899 Other long term (current) drug therapy: Secondary | ICD-10-CM

## 2021-04-03 MED ORDER — POTASSIUM CHLORIDE CRYS ER 20 MEQ PO TBCR: 20.0000 meq | EXTENDED_RELEASE_TABLET | Freq: Every day | ORAL | 3 refills | Status: DC

## 2021-04-16 ENCOUNTER — Encounter: Payer: Self-pay | Admitting: Internal Medicine

## 2021-04-16 NOTE — Telephone Encounter (Signed)
RX updated.

## 2021-04-17 ENCOUNTER — Other Ambulatory Visit: Payer: Self-pay

## 2021-04-17 MED ORDER — POTASSIUM CHLORIDE CRYS ER 20 MEQ PO TBCR: 20.0000 meq | EXTENDED_RELEASE_TABLET | Freq: Every day | ORAL | 3 refills | Status: DC

## 2021-04-17 NOTE — Addendum Note (Signed)
Addended by: Geradine Girt D on: 04/17/2021 03:48 PM ? ? Modules accepted: Orders ? ?

## 2021-04-17 NOTE — Progress Notes (Signed)
Patient agrees to taking potassium supplement  ?

## 2021-04-30 ENCOUNTER — Ambulatory Visit (INDEPENDENT_AMBULATORY_CARE_PROVIDER_SITE_OTHER): Payer: PPO | Admitting: Family Medicine

## 2021-04-30 ENCOUNTER — Other Ambulatory Visit (HOSPITAL_COMMUNITY)
Admission: RE | Admit: 2021-04-30 | Discharge: 2021-04-30 | Disposition: A | Discharge status: Home / Self Care | Payer: HMO | Source: Ambulatory Visit | Attending: Family Medicine | Admitting: Family Medicine

## 2021-04-30 ENCOUNTER — Encounter: Payer: Self-pay | Admitting: Family Medicine

## 2021-04-30 ENCOUNTER — Other Ambulatory Visit (HOSPITAL_COMMUNITY)
Admission: RE | Admit: 2021-04-30 | Discharge: 2021-04-30 | Disposition: A | Discharge status: Home / Self Care | Payer: PPO | Source: Ambulatory Visit | Attending: Family Medicine | Admitting: Family Medicine

## 2021-04-30 VITALS — BP 122/70 | HR 70 | Resp 16 | Ht 63.0 in | Wt 140.0 lb

## 2021-04-30 DIAGNOSIS — Z1151 Encounter for screening for human papillomavirus (HPV): Secondary | ICD-10-CM | POA: Diagnosis not present

## 2021-04-30 DIAGNOSIS — N938 Other specified abnormal uterine and vaginal bleeding: Secondary | ICD-10-CM | POA: Insufficient documentation

## 2021-04-30 DIAGNOSIS — Z01419 Encounter for gynecological examination (general) (routine) without abnormal findings: Secondary | ICD-10-CM | POA: Diagnosis not present

## 2021-04-30 DIAGNOSIS — Z9189 Other specified personal risk factors, not elsewhere classified: Secondary | ICD-10-CM | POA: Diagnosis not present

## 2021-04-30 NOTE — Patient Instructions (Addendum)
A few things to remember from today's visit: ? ?DUB (dysfunctional uterine bleeding) - Plan: PAP [[place]], Ambulatory referral to Gynecology ? ?Do not use My Chart to request refills or for acute issues that need immediate attention. ?  ?Please be sure medication list is accurate. ?If a new problem present, please set up appointment sooner than planned today. ?Abnormal Uterine Bleeding ?Abnormal uterine bleeding means bleeding more than normal from your womb (uterus). It can include: ?Bleeding after sex. ?Bleeding between monthly (menstrual) periods. ?Bleeding that is heavier than normal. ?Monthly periods that last longer than normal. ?Bleeding after you have stopped having your monthly period (menopause). ?You should see a doctor for any kind of bleeding that is not normal. Treatment depends on the cause of your bleeding and how much you bleed. ?Follow these instructions at home: ?Medicines ?Take over-the-counter and prescription medicines only as told by your doctor. ?Ask your doctor about: ?Taking medicines such as aspirin and ibuprofen. Do not take these medicines unless your doctor tells you to take them. ?Taking over-the-counter medicines, vitamins, herbs, and supplements. ?You may be given iron pills. Take them as told by your doctor. ?Managing constipation ?If you take iron pills, you may need to take these actions to prevent or treat trouble pooping (constipation): ?Drink enough fluid to keep your pee (urine) pale yellow. ?Take over-the-counter or prescription medicines. ?Eat foods that are high in fiber. These include beans, whole grains, and fresh fruits and vegetables. ?Limit foods that are high in fat and sugar. These include fried or sweet foods. ?Activity ?Change your activity to decrease bleeding if you need to change your sanitary pad more than one time every 2 hours: ?Lie in bed with your feet raised (elevated). ?Place a cold pack on your lower belly. ?Rest as much as you are able until the  bleeding stops or slows down. ?General instructions ?Do not use tampons, douche, or have sex until your doctor says these things are okay. ?Change your pads often. ?Get regular exams. These include: ?Pelvic exams. ?Screenings for cancer of the cervix. ?It is up to you to get the results of any tests that are done. Ask how to get your results when they are ready. ?Watch for any changes in your bleeding. For 2 months, write down: ?When your monthly period starts. ?When your monthly period ends. ?When you get any abnormal bleeding from your vagina. ?What problems you notice. ?Keep all follow-up visits. ?Contact a doctor if: ?The bleeding lasts more than one week. ?You feel dizzy at times. ?You feel like you may vomit (nausea). ?You vomit. ?You feel light-headed or weak. ?Your symptoms get worse. ?Get help right away if: ?You faint. ?You have to change pads every hour. ?You have pain in your belly. ?You have a fever or chills. ?You get sweaty or weak. ?You pass large blood clots from your vagina. ?These symptoms may be an emergency. Get help right away. Call your local emergency services (911 in the [place] ?Do not wait to see if the symptoms will go away. ?Do not drive yourself to the hospital. ?Summary ?Abnormal uterine bleeding means bleeding more than normal from your womb (uterus). ?Any kind of bleeding that is not normal should be checked by a doctor. ?Treatment depends on the cause of your bleeding and how much you bleed. ?Get help right away if you faint, you have to change pads every hour, or you pass large blood clots from your vagina. ?This information is not intended to replace advice given  to you by your health care provider. Make sure you discuss any questions you have with your health care provider. ?Document Revised: 05/09/2020 Document Reviewed: 05/09/2020 ?Elsevier Patient Education ? 2022 [place]. ? ? ? ? ? ? ? ?

## 2021-04-30 NOTE — Progress Notes (Signed)
?ACUTE VISIT ?Chief Complaint  ?Patient presents with  ? Vaginal Bleeding  ?  Happened about 10 days ago; not on underwear, only when she wiped. No pain, no uti symptoms, no pain.  ? ?HPI: ?Ms.Donna Warren is a 71 y.o. female with hx of HTN, OA,and hypoK+ here today complaining of one episode of vaginal bleeding 8-9 days ago as described above. ?She noted problem when she wiped after using the bathroom, she wiped a couple times and noted red bright blood on tissue. ?Negative for gross hematuria, dysuria,or frequency. ?Negative for rectal pain. ?No prior hx. ? 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 ?Reports colpos x 2 in her 20's, no hx of abnormal pap smear. ? ?Vaginal Bleeding ?The patient's pertinent negatives include no genital itching, genital lesions, genital odor, genital rash or pelvic pain. This is a new problem. The current episode started 1 to 4 weeks ago. The problem has been resolved. The patient is experiencing no pain. Pertinent negatives include no abdominal pain, back pain, chills, constipation, diarrhea, fever, flank pain, headaches, joint swelling, nausea, painful intercourse, rash, sore throat, urgency or vomiting. Nothing aggravates the symptoms. She has tried nothing for the symptoms. She is sexually active. No, her partner does not have an STD. There is no history of perineal abscess.  ? ?Negative for abnormal wt loss or night sweats. ?No hx of STD's. ? ?Review of Systems  ?Constitutional:  Negative for chills and fever.  ?HENT:  Negative for sore throat.   ?Gastrointestinal:  Negative for abdominal pain, constipation, diarrhea, nausea and vomiting.  ?Genitourinary:  Positive for vaginal bleeding. Negative for flank pain, pelvic pain and urgency.  ?Musculoskeletal:  Negative for back pain.  ?Skin:  Negative for rash.  ?Neurological:  Negative for headaches.  ?Rest see pertinent positives and negatives per HPI. ? ?Current Outpatient Medications on File Prior to Visit  ?Medication Sig  Dispense Refill  ? aspirin EC 81 MG tablet Take 81 mg by mouth daily.    ? Calcium Carb-Cholecalciferol 600-800 MG-UNIT TABS Take 1 tablet by mouth in the morning and at bedtime.     ? chlorthalidone (HYGROTON) 50 MG tablet Take 50 mg by mouth daily.    ? diazepam (VALIUM) 2 MG tablet Take 2 mg by mouth as needed (for Meniere's flare up).     ? divalproex (DEPAKOTE ER) 500 MG 24 hr tablet Take 1 tablet (500 mg total) by mouth at bedtime. 90 tablet 3  ? ondansetron (ZOFRAN-ODT) 4 MG disintegrating tablet Take 4 mg by mouth every 8 (eight) hours as needed.   1  ? potassium chloride SA (KLOR-CON M) 20 MEQ tablet Take 1 tablet (20 mEq total) by mouth daily. 30 tablet 3  ? Turmeric 500 MG CAPS Take 1 capsule by mouth daily.     ? ?Current Facility-Administered Medications on File Prior to Visit  ?Medication Dose Route Frequency Provider Last Rate Last Admin  ? 0.9 %  sodium chloride infusion  500 mL Intravenous Once Gatha Mayer, MD      ? ?Past Medical History:  ?Diagnosis Date  ? Abnormal laboratory test result elevated alpha feto protein  ? Adjustment reaction with anxiety and depression 06/23/2011  ? Arthritis   ? Cataract   ? developing  ? Femur fracture, right ([place]) 2003  ? Fibroids   ? Gilbert's syndrome   ? History of liver biopsy nl   ? records of fatty liver 2008 not confirmed   ? Hx of adenomatous colonic  polyps   ? Meniere disease   ? Right ear  ? Osteoporosis   ? Osteoporosis   ? Other hemochromatosis   ? heterozygosity for h63d gene  ? Spontaneous abortion   ? ?Allergies  ?Allergen Reactions  ? Penicillins   ?  Did it involve swelling of the face/tongue/throat, SOB, or low BP? No ?Did it involve sudden or severe rash/hives, skin peeling, or any reaction on the inside of your mouth or nose? No ?Did you need to seek medical attention at a hospital or doctor's office? No ?When did it last happen?       ?If all above answers are ?NO?, may proceed with cephalosporin use.  ? ?Social History  ? ?Socioeconomic  History  ? Marital status: Married  ?  Spouse name: Sandria Bales  ? Number of children: 2  ? Years of education: Not on file  ? Highest education level: Not on file  ?Occupational History  ? Not on file  ?Tobacco Use  ? Smoking status: Never  ? Smokeless tobacco: Never  ?Vaping Use  ? Vaping Use: Never used  ?Substance and Sexual Activity  ? Alcohol use: Yes  ?  Alcohol/week: 1.0 - 2.0 standard drink  ?  Types: 1 - 2 Glasses of wine per week  ?  Comment: glass of wine a week  ? Drug use: No  ? Sexual activity: Not on file  ?Other Topics Concern  ? Not on file  ?Social History Narrative  ? Living alone til 2023, husband works out of town  ? 2 daughters  ? Husband a Rabbi   ? 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 Drinks 1 cup of caffeine daily  ? Sleep  Often disrupted.   ? Alcohol about once a week.  ? [place] 2 pet dogs  ? Right Handed  ? ?Social Determinants of Health  ? ?Financial Resource Strain: Not on file  ?Food Insecurity: Not on file  ?Transportation Needs: Not on file  ?Physical Activity: Not on file  ?Stress: Not on file  ?Social Connections: Not on file  ? ?Vitals:  ? 04/30/21 1428  ?BP: 122/70  ?Pulse: 70  ?Resp: 16  ?SpO2: 99%  ? ?Body mass index is 24.8 kg/m?. ? ?Physical Exam ?Vitals and nursing note reviewed. Exam conducted with a chaperone present.  ?Constitutional:   ?   General: She is not in acute distress. ?   Appearance: She is well-developed.  ?HENT:  ?   Head: Normocephalic and atraumatic.  ?Eyes:  ?   Conjunctiva/sclera: Conjunctivae normal.  ?Cardiovascular:  ?   Rate and Rhythm: Normal rate and regular rhythm.  ?Pulmonary:  ?   Effort: Pulmonary effort is normal. No respiratory distress.  ?   Breath sounds: Normal breath sounds.  ?Abdominal:  ?   Palpations: Abdomen is soft. There is no mass.  ?   Tenderness: There is no abdominal tenderness.  ?Genitourinary: ?   General: Normal vulva.  ?   Exam position: Lithotomy position.  ?   Labia:     ?   Right: No rash,  tenderness or lesion.     ?   Left: No rash, tenderness or lesion.   ?   Vagina: No erythema or bleeding.  ?   Cervix: Erythema present. No cervical motion tenderness, discharge, friability, lesion or cervical bleeding.  ?   Comments: Small Nabothian cyst at 9 O'clock.  ?Cervical os with purplish area,  ecchymosis vs telangiectasias. ?Vaginal atrophy. ?Skin: ?   General: Skin is warm.  ?   Findings: No erythema.  ?Neurological:  ?   Mental Status: She is alert and oriented to person, place, and time.  ?Psychiatric:     ?   Mood and Affect: Mood and affect normal.  ? ?ASSESSMENT AND PLAN: ? ?Ms. Ryane was seen today for vaginal bleeding. ? ?Diagnoses and all orders for this visit: ?Orders Placed This Encounter  ?Procedures  ? Ambulatory referral to Gynecology  ? ?DUB (dysfunctional uterine bleeding) ?One episode, we discussed possible etiologies. ?She may need pelvic/transvaginal US and endometrial Bx, both can be discussed at the gyn's office. ?Clearly instructed about warning signs. ?I do not think blood work is needed today, may need to be considered if problem is recurrent. ? ?DES exposure in utero ?Cervical pap smear done today. ?Gyn referral placed. ? ?Return if symptoms worsen or fail to improve. ? ?Cyntia Staley G. [place], MD ? ?[place]. ?[facility]. ? ? ?

## 2021-05-04 ENCOUNTER — Encounter: Payer: Self-pay | Admitting: Internal Medicine

## 2021-05-07 ENCOUNTER — Ambulatory Visit: Payer: PPO | Admitting: Obstetrics and Gynecology

## 2021-05-07 ENCOUNTER — Encounter: Payer: Self-pay | Admitting: Obstetrics and Gynecology

## 2021-05-07 VITALS — BP 110/66 | HR 69 | Ht 63.5 in | Wt 140.0 lb

## 2021-05-07 DIAGNOSIS — N950 Postmenopausal bleeding: Secondary | ICD-10-CM

## 2021-05-07 DIAGNOSIS — Z9189 Other specified personal risk factors, not elsewhere classified: Secondary | ICD-10-CM

## 2021-05-07 NOTE — Patient Instructions (Signed)
Postmenopausal Bleeding Postmenopausal bleeding is any bleeding that a woman has after she has entered menopause. Menopause is the end of a woman's fertile years. After menopause, a woman no longer ovulates and does not have menstrual periods. Therefore, she should no longer have bleeding from her vagina. Postmenopausal bleeding may have various causes, including: Menopausal hormone therapy (MHT). Endometrial atrophy. After menopause, low estrogen hormone levels cause the membrane that lines the uterus (endometrium) to become thin. You may have bleeding as the endometrium thins. Endometrial hyperplasia. This condition is caused by excess estrogen hormones and low levels of progesterone hormones. The excess estrogen causes the endometrium to thicken, which can lead to bleeding. In some cases, this can lead to cancer of the uterus. Endometrial cancer. Noncancerous growths (polyps) on the endometrium, the lining of the uterus, or the cervix. Uterine fibroids. These are noncancerous growths in or around the uterus muscle tissue that can cause heavy bleeding. Any type of postmenopausal bleeding, even if it appears to be a typical menstrual period, should be checked by your health care provider. Treatment will depend on the cause of the bleeding. Follow these instructions at home:  Pay attention to any changes in your symptoms. Let your health care provider know about them. Avoid using tampons and douches as told by your health care provider. Change your pads regularly. Get regular pelvic exams, including Pap tests, as told by your health care provider. Take iron supplements as told by your health care provider. Take over-the-counter and prescription medicines only as told by your health care provider. Keep all follow-up visits. This is important. Contact a health care provider if: You have new bleeding from the vagina after menopause. You have pain in your abdomen. Get help right away if: You have  a fever or chills. You have severe pain with bleeding. You are passing blood clots. You have heavy bleeding, need more than 1 pad an hour, and have never experienced this before. You have headaches or feel faint or dizzy. Summary Postmenopausal bleeding is any bleeding that a woman has after she has entered into menopause. Postmenopausal bleeding may have various causes. Treatment will depend on the cause of the bleeding. Any type of postmenopausal bleeding, even if it appears to be a typical menstrual period, should be checked by your health care provider. Be sure to pay attention to any changes in your symptoms and keep all follow-up visits. This information is not intended to replace advice given to you by your health care provider. Make sure you discuss any questions you have with your health care provider. Document Revised: 06/24/2019 Document Reviewed: 06/24/2019 Elsevier Patient Education  2023 Elsevier Inc.  

## 2021-05-07 NOTE — Addendum Note (Signed)
Addended by: Yisroel Ramming, Dietrich Pates E on: 05/07/2021 12:16 PM ? ? Modules accepted: Orders ? ?

## 2021-05-07 NOTE — Progress Notes (Signed)
GYNECOLOGY  VISIT ?  ?HPI: ?71 y.o.   Married  Caucasian  female   ?[id number] with No LMP recorded. Patient is postmenopausal.   ?here for postmenopausal bleeding. Patient states she had one episode of vaginal bleeding 2 weeks ago. She denies any cramping or heavy bleeding. She only noticed blood with wiping. No bleeding since that time.  ? ?Not sexually active very often.  ? ?States DES exposure.  ?States she had a growth removed many years ago and she stayed in the hospital overnight.  ?She noted that her chart states ovarian cyst removal, but she is not certain if this is accurate.  ? ?No hemorrhoids, constipation of straining with BMs.  ?No rectal bleeding.  ? ?No dysuria.  ?No renal stones.  ? ?Not using any hormonal therapy.  ? ?Traveling a lot in May and June.  ?Husband moving to [place].  ?Patient just retired.  ? ?GYNECOLOGIC HISTORY: ?No LMP recorded. Patient is postmenopausal. ?Contraception:  PMP ?Menopausal hormone therapy:  none ?Last mammogram:  03-16-21 Neg/[facility] ?Last pap smear:   04-30-21 pending.  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 ?       ?OB History   ? ? Gravida  ?3  ? Para  ?2  ? Term  ?   ? Preterm  ?   ? AB  ?1  ? Living  ?2  ?  ? ? SAB  ?1  ? IAB  ?   ? Ectopic  ?   ? Multiple  ?   ? Live Births  ?   ?   ?  ? Obstetric Comments  ?Spontaneously abortion on 3rd pg triploid dna on path  ?  ? ?  ?    ? ?Patient Active Problem List  ? Diagnosis Date Noted  ? Right lateral epicondylitis 04/27/2020  ? Paresthesias 01/12/2019  ? AKI (acute kidney injury) ([place]) 01/12/2019  ? Essential hypertension 01/12/2019  ? Hypercalcemia 01/12/2019  ? Hypokalemia 01/12/2019  ? Bradycardia 01/12/2019  ? Radial head fracture 10/23/2017  ? 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 Primary osteoarthritis of both hands 06/05/2016  ? DJD (degenerative joint disease), cervical 05/08/2016  ? Female genital lesion 08/12/2014  ? Osteoporosis 07/20/2014  ? Elevated  LFTs 05/21/2013  ? Hemochromatosis carrier 02/23/2013  ? 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 Meniere's disease 10/14/2011  ? Skin lesion 08/14/2011  ? Right shoulder strain 08/13/2011  ? Phlebitis, superficial 08/16/2010  ? DECREASED HEARING, RIGHT EAR 11/08/2009  ? PROBLEMS WITH HEARING 11/08/2009  ? DYSLIPIDEMIA 10/07/2008  ? 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 FIBROIDS, UTERUS 05/29/2007  ? LIVER HEMANGIOMA 05/29/2007  ? COLONIC POLYPS, HX OF 05/29/2007  ? ? ?Past Medical History:  ?Diagnosis Date  ? Abnormal laboratory test result elevated alpha feto protein  ? Adjustment reaction with anxiety and depression 06/23/2011  ? Arthritis   ? Cataract   ? developing  ? Femur fracture, right ([place]) 2003  ? Fibroids   ? Gilbert's syndrome   ? 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 History of liver biopsy nl   ? records of fatty liver 2008 not confirmed   ? Hx of adenomatous colonic polyps   ? Meniere disease   ? Right ear  ? Osteoporosis   ? Osteoporosis   ? Other hemochromatosis   ? heterozygosity for h63d gene  ? Seizure disorder ([place])   ?  Spontaneous abortion   ? ? ?Past Surgical History:  ?Procedure Laterality Date  ? BREAST CYST EXCISION Right   ? [place]  ? COLONOSCOPY  220, 2004, 2007, 08/13/2010  ? 2002: 8 mm polyp 2004: no polyps 2007: no polyps 2012: hemorrhoids  ? hosp r/o mi cv right chest and jaw pain  2010  ? LIVER BIOPSY    ? 2015 negative   no fatty liver  records  2008 said fatty liver    ? OVARIAN CYST REMOVAL    ? POLYPECTOMY    ? spontaneously abortion on 3rd preg    ? triploid dna on path  ? ? ?Current Outpatient Medications  ?Medication Sig Dispense Refill  ? aspirin EC 81 MG tablet Take 81 mg by mouth daily.    ? Calcium Carb-Cholecalciferol 600-800 MG-UNIT TABS Take 1 tablet by mouth in the morning and at bedtime.     ? chlorthalidone (HYGROTON) 50 MG tablet Take 50 mg by mouth daily.    ? diazepam (VALIUM) 2 MG tablet Take 2 mg by  mouth as needed (for Meniere's flare up).     ? divalproex (DEPAKOTE ER) 500 MG 24 hr tablet Take 1 tablet (500 mg total) by mouth at bedtime. 90 tablet 3  ? ondansetron (ZOFRAN-ODT) 4 MG disintegrating tablet Take 4 mg by mouth every 8 (eight) hours as needed.   1  ? potassium chloride SA (KLOR-CON M) 20 MEQ tablet Take 1 tablet (20 mEq total) by mouth daily. 30 tablet 3  ? Turmeric 500 MG CAPS Take 1 capsule by mouth daily.     ? ?Current Facility-Administered Medications  ?Medication Dose Route Frequency Provider Last Rate Last Admin  ? 0.9 %  sodium chloride infusion  500 mL Intravenous Once Gatha Mayer, MD      ?  ? ?ALLERGIES: Penicillins ? ?Family History  ?Problem Relation Age of Onset  ? Colon cancer Father   ?     dx'd late to mid 64's  ? Other Father   ?     tinnitus  ? Heart attack Father   ? Depression Mother   ? Anxiety disorder Mother   ? Dementia Mother   ? Other Mother   ?     high calcium  ? Hearing loss Mother   ?     death  ? Pulmonary disease Mother   ? Stroke Other   ?     silent  ? Other Brother   ?     elevated LFTs  ? Healthy Daughter   ? Healthy Daughter   ? Colon polyps Neg Hx   ? Esophageal cancer Neg Hx   ? Rectal cancer Neg Hx   ? Stomach cancer Neg Hx   ? Breast cancer Neg Hx   ? ? ?Social History  ? ?Socioeconomic History  ? Marital status: Married  ?  Spouse name: Sandria Bales  ? Number of children: 2  ? Years of education: Not on file  ? Highest education level: Not on file  ?Occupational History  ? Not on file  ?Tobacco Use  ? Smoking status: Never  ? Smokeless tobacco: Never  ?Vaping Use  ? Vaping Use: Never used  ?Substance and Sexual Activity  ? Alcohol use: Yes  ?  Alcohol/week: 1.0 - 2.0 standard drink  ?  Types: 1 - 2 Glasses of wine per week  ?  Comment: glass of wine a week  ? Drug use: No  ?  Sexual activity: Yes  ?Other Topics Concern  ? Not on file  ?Social History Narrative  ? Living alone til 2023, husband works out of town  ? 2 daughters  ? Husband a Rabbi   ?  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 Drinks 1 cup of caffeine daily  ? Sleep  Often disrupted.   ? Alcohol about once a week.  ? [place] 2 pet dogs  ? Right Handed  ? ?Social Determinants of Health  ? ?Financial Resource Strain: Not on file  ?Food Insecurity: Not on file  ?Transportation Needs: Not on file  ?Physical Activity: Not on file  ?Stress: Not on file  ?Social Connections: Not on file  ?Intimate Partner Violence: Not on file  ? ? ?Review of Systems  ?Genitourinary:  Positive for vaginal bleeding.  ?All other systems reviewed and are negative. ? ?PHYSICAL EXAMINATION:   ? ?BP 110/66   Pulse 69   Ht 5' 3.5" (1.613 m)   Wt 140 lb (63.5 kg)   SpO2 98%   BMI 24.41 kg/m?     ?General appearance: alert, cooperative and appears stated age ?Head: Normocephalic, without obvious abnormality, atraumatic ?Neck: no adenopathy, supple, symmetrical, trachea midline and thyroid normal to inspection and palpation ?Lungs: clear to auscultation bilaterally ?Heart: regular rate and rhythm ?Abdomen: soft, non-tender, no masses,  no organomegaly ?Extremities: extremities normal, atraumatic, no cyanosis or edema ?Skin: Skin color, texture, turgor normal. No rashes or lesions ?Lymph nodes: Cervical, supraclavicular, and axillary nodes normal. ?No abnormal inguinal nodes palpated ?Neurologic: Grossly normal ? ?Pelvic: External genitalia:  no lesions ?             Urethra:  normal appearing urethra with no masses, tenderness or lesions ?             Bartholins and Skenes: normal    ?             Vagina: normal appearing vagina with normal color and discharge, no lesions ?             Cervix: flush with vaginal cuff.  Difficult to see os.  ?               ?Bimanual Exam:  Uterus:  normal size, contour, position, consistency, mobility, non-tender ?             Adnexa: no mass, fullness, tenderness ?             Rectal exam: Yes.  .  Confirms. ?             Anus:  normal sphincter tone, no  lesions ? ?Chaperone was present for exam:  Estill Bamberg, CMA ? ?ASSESSMENT ? ?Postmenopausal bleeding.  ?Hx DES exposure.  ?Hx fibroids.  ?? Cervical conization ? ?PLAN ? ?We discussed potential causes of postmenopausal bleeding: at

## 2021-05-07 NOTE — Telephone Encounter (Signed)
Noted  

## 2021-05-08 ENCOUNTER — Encounter: Payer: Self-pay | Admitting: Internal Medicine

## 2021-05-08 ENCOUNTER — Other Ambulatory Visit (INDEPENDENT_AMBULATORY_CARE_PROVIDER_SITE_OTHER): Payer: HMO

## 2021-05-08 DIAGNOSIS — E876 Hypokalemia: Secondary | ICD-10-CM | POA: Diagnosis not present

## 2021-05-08 LAB — BASIC METABOLIC PANEL
BUN: 24 mg/dL — ABNORMAL HIGH (ref 6–23)
CO2: 33 mEq/L — ABNORMAL HIGH (ref 19–32)
Calcium: 9.5 mg/dL (ref 8.4–10.5)
Chloride: 99 mEq/L (ref 96–112)
Creatinine, Ser: 0.95 mg/dL (ref 0.40–1.20)
GFR: 60.73 mL/min (ref >60.00)
Glucose, Bld: 132 mg/dL — ABNORMAL HIGH (ref 70–99)
Potassium: 3.6 mEq/L (ref 3.5–5.1)
Sodium: 138 mEq/L (ref 135–145)

## 2021-05-08 LAB — MAGNESIUM: Magnesium: 1.8 mg/dL (ref 1.5–2.5)

## 2021-05-08 NOTE — Progress Notes (Signed)
GYNECOLOGY  VISIT ?  ?HPI: ?71 y.o.   Married  Caucasian  female   ?[id number] with No LMP recorded. Patient is postmenopausal.   ?here for pelvic ultrasound and possible EMB.  ? ?Had postmenopausal bleeding about 2 weeks ago.  ?Unable to do office endometrial biopsy on 05/07/21 due to a cervix flush with the vagina.  ? ?Had a prior D & C due to miscarriage.  ? ?Glucose 135 this week.  ? ?Traveling 5/13 - 6/5.  ? ?GYNECOLOGIC HISTORY: ?No LMP recorded. Patient is postmenopausal. ?Contraception:  PMP ?Menopausal hormone therapy:  none ?Last mammogram:  03-16-21 Neg/[facility]1 ?Last pap smear:    04-30-21 pending.  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 ?       ?OB History   ? ? Gravida  ?3  ? Para  ?2  ? Term  ?   ? Preterm  ?   ? AB  ?1  ? Living  ?2  ?  ? ? SAB  ?1  ? IAB  ?   ? Ectopic  ?   ? Multiple  ?   ? Live Births  ?   ?   ?  ? Obstetric Comments  ?Spontaneously abortion on 3rd pg triploid dna on path  ?  ? ?  ?    ? ?Patient Active Problem List  ? Diagnosis Date Noted  ? Right lateral epicondylitis 04/27/2020  ? Paresthesias 01/12/2019  ? AKI (acute kidney injury) ([place]) 01/12/2019  ? Essential hypertension 01/12/2019  ? Hypercalcemia 01/12/2019  ? Hypokalemia 01/12/2019  ? Bradycardia 01/12/2019  ? Radial head fracture 10/23/2017  ? 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 Primary osteoarthritis of both hands 06/05/2016  ? DJD (degenerative joint disease), cervical 05/08/2016  ? Female genital lesion 08/12/2014  ? Osteoporosis 07/20/2014  ? Elevated LFTs 05/21/2013  ? Hemochromatosis carrier 02/23/2013  ? 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 Meniere's disease 10/14/2011  ? Skin lesion 08/14/2011  ? Right shoulder strain 08/13/2011  ? Phlebitis, superficial 08/16/2010  ? DECREASED HEARING, RIGHT EAR 11/08/2009  ? PROBLEMS WITH HEARING 11/08/2009  ? DYSLIPIDEMIA 10/07/2008  ? 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  FIBROIDS, UTERUS 05/29/2007  ? LIVER HEMANGIOMA 05/29/2007  ? COLONIC POLYPS, HX OF 05/29/2007  ? ? ?Past Medical History:  ?Diagnosis Date  ? Abnormal laboratory test result elevated alpha feto protein  ? Adjustment reaction with anxiety and depression 06/23/2011  ? Arthritis   ? Cataract   ? developing  ? Femur fracture, right ([place]) 2003  ? Fibroids   ? Gilbert's syndrome   ? 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 History of liver biopsy nl   ? records of fatty liver 2008 not confirmed   ? Hx of adenomatous colonic polyps   ? Meniere disease   ? Right ear  ? Osteoporosis   ? Osteoporosis   ? Other hemochromatosis   ? heterozygosity for h63d gene  ? Seizure disorder ([place])   ? Spontaneous abortion   ? ? ?Past Surgical History:  ?Procedure Laterality Date  ? BREAST CYST EXCISION Right   ? [place]  ? COLONOSCOPY  220, 2004, 2007, 08/13/2010  ? 2002: 8 mm polyp 2004: no polyps 2007: no polyps 2012: hemorrhoids  ? hosp r/o mi cv right chest and jaw pain  2010  ? LIVER BIOPSY    ?  2015 negative   no fatty liver  records  2008 said fatty liver    ? OVARIAN CYST REMOVAL    ? POLYPECTOMY    ? spontaneously abortion on 3rd preg    ? triploid dna on path  ? ? ?Current Outpatient Medications  ?Medication Sig Dispense Refill  ? aspirin EC 81 MG tablet Take 81 mg by mouth daily.    ? Calcium Carb-Cholecalciferol 600-800 MG-UNIT TABS Take 1 tablet by mouth in the morning and at bedtime.     ? chlorthalidone (HYGROTON) 50 MG tablet Take 50 mg by mouth daily.    ? diazepam (VALIUM) 2 MG tablet Take 2 mg by mouth as needed (for Meniere's flare up).     ? divalproex (DEPAKOTE ER) 500 MG 24 hr tablet Take 1 tablet (500 mg total) by mouth at bedtime. 90 tablet 3  ? ondansetron (ZOFRAN-ODT) 4 MG disintegrating tablet Take 4 mg by mouth every 8 (eight) hours as needed.   1  ? potassium chloride SA (KLOR-CON M) 20 MEQ tablet Take 1 tablet (20 mEq total) by mouth daily. 30 tablet 3  ? Turmeric 500 MG CAPS Take 1 capsule by  mouth daily.     ? ?Current Facility-Administered Medications  ?Medication Dose Route Frequency Provider Last Rate Last Admin  ? 0.9 %  sodium chloride infusion  500 mL Intravenous Once Gatha Mayer, MD      ?  ? ?ALLERGIES: Penicillins ? ?Family History  ?Problem Relation Age of Onset  ? Colon cancer Father   ?     dx'd late to mid 44's  ? Other Father   ?     tinnitus  ? Heart attack Father   ? Depression Mother   ? Anxiety disorder Mother   ? Dementia Mother   ? Other Mother   ?     high calcium  ? Hearing loss Mother   ?     death  ? Pulmonary disease Mother   ? Stroke Other   ?     silent  ? Other Brother   ?     elevated LFTs  ? Healthy Daughter   ? Healthy Daughter   ? Colon polyps Neg Hx   ? Esophageal cancer Neg Hx   ? Rectal cancer Neg Hx   ? Stomach cancer Neg Hx   ? Breast cancer Neg Hx   ? ? ?Social History  ? ?Socioeconomic History  ? Marital status: Married  ?  Spouse name: Sandria Bales  ? Number of children: 2  ? Years of education: Not on file  ? Highest education level: Not on file  ?Occupational History  ? Not on file  ?Tobacco Use  ? Smoking status: Never  ? Smokeless tobacco: Never  ?Vaping Use  ? Vaping Use: Never used  ?Substance and Sexual Activity  ? Alcohol use: Yes  ?  Alcohol/week: 1.0 - 2.0 standard drink  ?  Types: 1 - 2 Glasses of wine per week  ?  Comment: glass of wine a week  ? Drug use: No  ? Sexual activity: Yes  ?Other Topics Concern  ? Not on file  ?Social History Narrative  ? Living alone til 2023, husband works out of town  ? 2 daughters  ? Husband a Rabbi   ? 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 Drinks 1 cup of caffeine daily  ? Sleep  Often disrupted.   ?  Alcohol about once a week.  ? [place] 2 pet dogs  ? Right Handed  ? ?Social Determinants of Health  ? ?Financial Resource Strain: Not on file  ?Food Insecurity: Not on file  ?Transportation Needs: Not on file  ?Physical Activity: Not on file  ?Stress: Not on file  ?Social Connections: Not on  file  ?Intimate Partner Violence: Not on file  ? ? ?Review of Systems  ?All other systems reviewed and are negative. ? ?PHYSICAL EXAMINATION:   ? ?BP 122/80   Ht 5' 3.5" (1.613 m)   Wt 140 lb (63.5 kg)   BMI 24.41 kg/m?     ?General appearance: alert, cooperative and appears stated age ?  ?Pelvic US ?Uterus 5.58 x 2.75 x 2.11 cm. ?EMS 4.71 mm.  Fluid with debris in the endometrial canal.  0.4 x 0.2 cm echogenic focus.  Possible polyp versus calcification.  ?Left ovary 1.57 x 1.67 x 0.98 cm.  ?Right ovary 2.22 x 0.86 x 1.36 cm.  ?Right para ovarian cyst 1.0 - 0.8 cm.  ?No free fluid.  ? ?ASSESSMENT ? ?Postmenopausal bleeding.  ?Fluid in endometrial canal.  ?EMS over 3 mm.  Possible mass.  ?Cervical stenosis.  ?Right para-ovarian cyst.  ?Elevated blood sugar. ? ?PLAN ? ?We reviewed US findings and report.  ?CA125.  ?A1C.  ?I recommended hysteroscopy with possible Myosure resection of endometrial mass, and dilation and curettage under ultrasound guidance.  ?Risks, benefits, and alternatives reviewed. ?Risks include but are not limited to bleeding, infection, damage to surrounding organs including uterine perforation requiring hospitalization and laparoscopy, pulmonary edema, reaction to anesthesia, DVT, PE, death, and need for further treatment. ?Surgical expectations and recovery discussed.  ?Patient wishes to proceed. ?  ?An After Visit Summary was printed and given to the patient. ? ?33 min  total time was spent for this patient encounter, including preparation, face-to-face counseling with the patient, coordination of care, and documentation of the encounter. ? ? ?

## 2021-05-09 ENCOUNTER — Encounter: Payer: Self-pay | Admitting: Internal Medicine

## 2021-05-09 ENCOUNTER — Encounter: Payer: Self-pay | Admitting: Family Medicine

## 2021-05-09 NOTE — Progress Notes (Signed)
Magnesium and potassium both in range

## 2021-05-09 NOTE — Telephone Encounter (Signed)
potassium was done in the BMP  it was  3.6  ?Sorry for the confusion . ? magnesium can be low with low potassium that is why both tests were done.

## 2021-05-10 ENCOUNTER — Ambulatory Visit: Payer: PPO | Admitting: Obstetrics and Gynecology

## 2021-05-10 ENCOUNTER — Encounter: Payer: Self-pay | Admitting: Obstetrics and Gynecology

## 2021-05-10 ENCOUNTER — Ambulatory Visit (INDEPENDENT_AMBULATORY_CARE_PROVIDER_SITE_OTHER): Payer: PPO

## 2021-05-10 ENCOUNTER — Other Ambulatory Visit: Payer: Self-pay | Admitting: Obstetrics and Gynecology

## 2021-05-10 VITALS — BP 122/80 | Ht 63.5 in | Wt 140.0 lb

## 2021-05-10 DIAGNOSIS — N950 Postmenopausal bleeding: Secondary | ICD-10-CM

## 2021-05-10 DIAGNOSIS — N8500 Endometrial hyperplasia, unspecified: Secondary | ICD-10-CM | POA: Diagnosis not present

## 2021-05-10 DIAGNOSIS — M859 Disorder of bone density and structure, unspecified: Secondary | ICD-10-CM | POA: Diagnosis not present

## 2021-05-10 DIAGNOSIS — R7309 Other abnormal glucose: Secondary | ICD-10-CM | POA: Diagnosis not present

## 2021-05-10 DIAGNOSIS — N859 Noninflammatory disorder of uterus, unspecified: Secondary | ICD-10-CM

## 2021-05-10 DIAGNOSIS — N882 Stricture and stenosis of cervix uteri: Secondary | ICD-10-CM | POA: Diagnosis not present

## 2021-05-10 DIAGNOSIS — N83201 Unspecified ovarian cyst, right side: Secondary | ICD-10-CM | POA: Diagnosis not present

## 2021-05-10 DIAGNOSIS — D259 Leiomyoma of uterus, unspecified: Secondary | ICD-10-CM | POA: Diagnosis not present

## 2021-05-10 LAB — CYTOLOGY - PAP
Chlamydia: NEGATIVE
Comment: NEGATIVE
Comment: NEGATIVE
Comment: NEGATIVE
Comment: NORMAL
Diagnosis: NEGATIVE
High risk HPV: NEGATIVE
Neisseria Gonorrhea: NEGATIVE
Trichomonas: NEGATIVE

## 2021-05-11 ENCOUNTER — Telehealth: Payer: Self-pay | Admitting: Obstetrics and Gynecology

## 2021-05-11 LAB — CA 125: CA 125: 4 U/mL (ref <35)

## 2021-05-11 LAB — HEMOGLOBIN A1C
Hgb A1c MFr Bld: 5.2 % of total Hgb (ref <5.7)
Mean Plasma Glucose: 103 mg/dL
eAG (mmol/L): 5.7 mmol/L

## 2021-05-11 NOTE — Telephone Encounter (Signed)
Spoke with patient. Reviewed surgery dates. Patient request to proceed with surgery on 05/22/21.  Advised patient I will forward to business office for return call. I will return call once surgery date and time confirmed. Patient verbalizes understanding and is agreeable.  ? ?Surgery request sent.  ? ?

## 2021-05-11 NOTE — Telephone Encounter (Signed)
Please precert and schedule hysteroscopy, possible Myosure resection of endometrial mass, dilation and curettage under ultrasound guidance at [facility].  ? ?Diagnosis:  postmenopausal bleeding, cervical stenosis, fluid in endometrial canal, possible endometrial mass.  ? ?(She does have a CA125 pending to evaluate a tiny adnexal mass.) ? ?Patient is traveling from 5/13 - 6/5.  ? ?I would recommend waiting about one week of time from surgery before traveling.  ?

## 2021-05-11 NOTE — Telephone Encounter (Signed)
Spoke with patient. Surgery date request confirmed.  ?Advised surgery is scheduled for 05/22/21, [facility] at 1130.  ?Surgery instruction sheet and hospital brochure reviewed, printed copy will be mailed.  ?Patient verbalizes understanding and is agreeable.  ?Routing to [facility] for benefits.  ?Encounter can be closed after benefits reviewed.  ? ?

## 2021-05-12 ENCOUNTER — Encounter: Payer: Self-pay | Admitting: Obstetrics and Gynecology

## 2021-05-13 NOTE — Telephone Encounter (Signed)
It appears that the message on 4 21 answered  was viewed by her but if you are not sure   ask the patient if she has any more  questions.  ?

## 2021-05-14 NOTE — Telephone Encounter (Signed)
Spoke with patient regarding surgery benefits. Patient acknowledges understanding of information presented. Patient is aware that benefits presented are professional benefits only. Patient is aware the hospital will call with facility benefits. See account note. ? ?Encounter closed. ?

## 2021-05-15 ENCOUNTER — Other Ambulatory Visit (HOSPITAL_COMMUNITY): Payer: Self-pay | Admitting: Obstetrics and Gynecology

## 2021-05-15 ENCOUNTER — Encounter: Payer: Self-pay | Admitting: Internal Medicine

## 2021-05-15 ENCOUNTER — Ambulatory Visit (INDEPENDENT_AMBULATORY_CARE_PROVIDER_SITE_OTHER): Payer: HMO | Admitting: Internal Medicine

## 2021-05-15 ENCOUNTER — Ambulatory Visit (INDEPENDENT_AMBULATORY_CARE_PROVIDER_SITE_OTHER): Payer: HMO

## 2021-05-15 ENCOUNTER — Encounter: Payer: Self-pay | Admitting: Obstetrics and Gynecology

## 2021-05-15 VITALS — BP 102/50 | HR 74 | Temp 98.5°F | Ht 63.5 in | Wt 138.6 lb

## 2021-05-15 DIAGNOSIS — M19072 Primary osteoarthritis, left ankle and foot: Secondary | ICD-10-CM | POA: Diagnosis not present

## 2021-05-15 DIAGNOSIS — M79672 Pain in left foot: Secondary | ICD-10-CM

## 2021-05-15 DIAGNOSIS — N950 Postmenopausal bleeding: Secondary | ICD-10-CM

## 2021-05-15 NOTE — Progress Notes (Signed)
? ?Chief Complaint  ?Patient presents with  ? Toe Pain  ?  Left foot pain x 8 days shooting sharp pain   ? ? ?HPI: ?Donna Warren 71 y.o. come in for new problem. ?Usually walks  2 miles per day and one day  middle toes began to hurt hurt   middle and ache  and tip of toe  hurt  second third ?worse in sneaker s   sharp pain at times radiating  not at rest associated with weight bearing . ?No injury  ? ?To have Gyne procedure next week for post menopausal bleeding  and  later to go with family  trip to [place].  ? ?ROS: See pertinent positives and negatives per HPI. ? ?Past Medical History:  ?Diagnosis Date  ? Abnormal laboratory test result elevated alpha feto protein  ? Adjustment reaction with anxiety and depression 06/23/2011  ? Arthritis   ? Cataract   ? developing  ? Femur fracture, right ([place]) 2003  ? Fibroids   ? Gilbert's syndrome   ? 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 History of liver biopsy nl   ? records of fatty liver 2008 not confirmed   ? Hx of adenomatous colonic polyps   ? Meniere disease   ? Right ear  ? Osteoporosis   ? Osteoporosis   ? Other hemochromatosis   ? heterozygosity for h63d gene  ? Seizure disorder ([place])   ? Spontaneous abortion   ? ? ?Family History  ?Problem Relation Age of Onset  ? Colon cancer Father   ?     dx'd late to mid 48's  ? Other Father   ?     tinnitus  ? Heart attack Father   ? Depression Mother   ? Anxiety disorder Mother   ? Dementia Mother   ? Other Mother   ?     high calcium  ? Hearing loss Mother   ?     death  ? Pulmonary disease Mother   ? Stroke Other   ?     silent  ? Other Brother   ?     elevated LFTs  ? Healthy Daughter   ? Healthy Daughter   ? Colon polyps Neg Hx   ? Esophageal cancer Neg Hx   ? Rectal cancer Neg Hx   ? Stomach cancer Neg Hx   ? Breast cancer Neg Hx   ? ? ?Social History  ? ?Socioeconomic History  ? Marital status: Married  ?  Spouse name: Sandria Bales  ? Number of children: 2  ? Years of education: Not on file  ? Highest education level: Not  on file  ?Occupational History  ? Not on file  ?Tobacco Use  ? Smoking status: Never  ? Smokeless tobacco: Never  ?Vaping Use  ? Vaping Use: Never used  ?Substance and Sexual Activity  ? Alcohol use: Yes  ?  Alcohol/week: 1.0 - 2.0 standard drink  ?  Types: 1 - 2 Glasses of wine per week  ?  Comment: glass of wine a week  ? Drug use: No  ? Sexual activity: Yes  ?Other Topics Concern  ? Not on file  ?Social History Narrative  ? Living alone til 2023, husband works out of town  ? 2 daughters  ? Husband a Rabbi   ? 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 Drinks 1 cup of caffeine daily  ? Sleep  Often disrupted.   ? Alcohol about once a week.  ? [place] 2 pet dogs  ? Right Handed  ? ?Social Determinants of Health  ? ?Financial Resource Strain: Not on file  ?Food Insecurity: Not on file  ?Transportation Needs: Not on file  ?Physical Activity: Not on file  ?Stress: Not on file  ?Social Connections: Not on file  ? ? ?Outpatient Medications Prior to Visit  ?Medication Sig Dispense Refill  ? aspirin EC 81 MG tablet Take 81 mg by mouth daily.    ? Calcium Carb-Cholecalciferol 600-800 MG-UNIT TABS Take 1 tablet by mouth in the morning and at bedtime.     ? chlorthalidone (HYGROTON) 50 MG tablet Take 50 mg by mouth daily.    ? diazepam (VALIUM) 2 MG tablet Take 2 mg by mouth as needed (for Meniere's flare up).     ? divalproex (DEPAKOTE ER) 500 MG 24 hr tablet Take 1 tablet (500 mg total) by mouth at bedtime. 90 tablet 3  ? ondansetron (ZOFRAN-ODT) 4 MG disintegrating tablet Take 4 mg by mouth every 8 (eight) hours as needed.   1  ? potassium chloride SA (KLOR-CON M) 20 MEQ tablet Take 1 tablet (20 mEq total) by mouth daily. 30 tablet 3  ? Turmeric 500 MG CAPS Take 1 capsule by mouth daily.     ? ?Facility-Administered Medications Prior to Visit  ?Medication Dose Route Frequency Provider Last Rate Last Admin  ? 0.9 %  sodium chloride infusion  500 mL Intravenous Once Gatha Mayer, MD       ? ? ? ?EXAM: ? ?BP (!) 102/50 (BP Location: Left Arm, Patient Position: Sitting, Cuff Size: Normal)   Pulse 74   Temp 98.5 ?F (36.9 ?C) (Oral)   Ht 5' 3.5" (1.613 m)   Wt 138 lb 9.6 oz (62.9 kg)   SpO2 95%   BMI 24.17 kg/m?  ? ?Body mass index is 24.17 kg/m?. ? ?GENERAL: vitals reviewed and listed above, alert, oriented, appears well hydrated and in no acute distress ?HEENT: atraumatic, conjunctiva  clear, no obvious abnormalities on inspection of external nose and ears MS: moves all extremities without noticeable focal  abnormality left foot nl by inspection NV intact  no bony tenderness and neg squeeze test.  But area of concern seem to be distal inter digital metatarsal 2 and 3  skin is clear.  ?Gait  nl  ?PSYCH: pleasant and cooperative, no obvious depression or anxiety ? ?BP Readings from Last 3 Encounters:  ?05/15/21 (!) 102/50  ?05/10/21 122/80  ?05/07/21 110/66  ? ? ?ASSESSMENT AND PLAN: ? ?Discussed the following assessment and plan: ? ?Foot pain, left - Plan: DG Foot Complete Left ?Consider atypical morton's  metatarsalgia radiating to toes?  Exam reassuring  . Hx of fractures  no bony tenderness like stress fx . ?To have gyne procedure    ? dec walking ;  good shoe support ;   get appt with Dr Tamala Julian for opinion  if   persistent or progressive  (podiatry  alternative ) ?-Patient advised to return or notify health care team  if  new concerns arise. ? ?Patient Instructions  ? ?X ray foot today  consider "mortons neuroma"  causing toe pain .  ? ?Consider  inserts  support .  Could help maybe  ? ? ?Standley Brooking. Lynda Capistran M.D. ?

## 2021-05-15 NOTE — Progress Notes (Signed)
? ?I, Donna Warren, LAT, ATC, am serving as scribe for Dr. [name]. ? ?Donna Warren is a 71 y.o. female who presents to [place] at [facility] today for L foot/toe pain.  She was last seen by Dr. Tamala Julian on 12/22/20 for R elbow pain.  Today, pt reports pain in L toes/foot x approximately one week-10 days that began after walking for exercise.  She locates her pain to her her L plantar 2nd and 3rd toes and the 2nd and 3rd MTPJ. ? ?Swelling: no ?Paresthesias: no ?Aggravating factors: prolonged walking; L toe F/E ?Treatments tried: nothing ? ?She is also reporting R thumb pain at the base of her R thumb.  She did a lot of reorganization with a lot of heavy lifting with the right hand.  She notes this caused a lot of thumb soreness which is now improving without any specific treatment. ? ?Diagnostic testing L foot XR- 05/15/21 ? ?Pertinent review of systems: No fevers or chills ? ?Relevant historical information: Hypertension ? ? ?Exam:  ?BP 96/60 (BP Location: Right Arm, Patient Position: Sitting, Cuff Size: Normal)   Pulse 76   Ht 5' 3.5" (1.613 m)   Wt 139 lb 9.6 oz (63.3 kg)   SpO2 96%   BMI 24.34 kg/m?  ?General: Well Developed, well nourished, and in no acute distress.  ? ?MSK: Left foot: Normal. ?Tender palpation plantar third metatarsal head.  Negative metatarsal squeeze test.  Normal foot and ankle motion. ?Pulses capillary fill and sensation are intact distally. ?Intact strength. ? ?Right thumb: Nontender normal motion normal strength. ? ? ? ?Lab and Radiology Results ?No results found for this or any previous visit (from the past 72 hour(s)). ?DG Foot Complete Left ? ?Result Date: 05/16/2021 ?CLINICAL DATA:  Left foot metatarsal pain, 3rd digit pain. EXAM: LEFT FOOT - COMPLETE 3+ VIEW COMPARISON:  X-ray 11/29/2016. FINDINGS: Minimal great toe metatarsophalangeal joint space narrowing and dorsal metatarsal head degenerative spurring. Mild joint space narrowing of the interphalangeal  joints diffusely. Likely mild third tarsometatarsal joint space narrowing. No acute fracture or dislocation. IMPRESSION: Mild forefoot osteoarthritis. Electronically Signed   By: Yvonne Kendall M.D.   On: 05/16/2021 08:45   ?I, [name], personally (independently) visualized and performed the interpretation of the images attached in this note. ? ? ? ? ?Assessment and Plan: ?71 y.o. female with left foot pain thought to be due to either metatarsalgia or Morton's neuroma or possibly both.  Plan for metatarsal pads at [facility]. ? ?Additionally she has some right thumb soreness I think due to overuse.  She may have degenerative changes in the right hand which could contribute to this but her symptoms are improving enough at this point that I do not think further work-up or specific treatment is indicated.  Recommend Voltaren gel and if needed hand therapy. ? ?Recheck in 1 month. ? ? ?PDMP not reviewed this encounter. ?Orders Placed This Encounter  ?Procedures  ? [place] LIMITED JOINT SPACE STRUCTURES LOW LEFT(NO LINKED CHARGES)  ?  Order Specific Question:   Reason for Exam (SYMPTOM  OR DIAGNOSIS REQUIRED)  ?  Answer:   left foot pain  ?  Order Specific Question:   Preferred imaging location?  ?  Answer:   [place]  ? ?No orders of the defined types were placed in this encounter. ? ? ? ?Discussed warning signs or symptoms. Please see discharge instructions. Patient expresses understanding. ? ? ?The above documentation has been reviewed and  is accurate and complete [name], M.D. ? ? ?

## 2021-05-15 NOTE — Telephone Encounter (Signed)
Patient has an appt 05/15/21 ?

## 2021-05-15 NOTE — Patient Instructions (Addendum)
?  X ray foot today  consider "mortons neuroma"  causing toe pain .  ? ?Consider  inserts  support .  Could help maybe  ?

## 2021-05-16 NOTE — Progress Notes (Signed)
Xray shows  no fracture  some  mild arthritis

## 2021-05-17 ENCOUNTER — Encounter: Payer: Self-pay | Admitting: Family Medicine

## 2021-05-17 ENCOUNTER — Telehealth: Payer: Self-pay

## 2021-05-17 ENCOUNTER — Ambulatory Visit: Payer: Self-pay

## 2021-05-17 ENCOUNTER — Ambulatory Visit (INDEPENDENT_AMBULATORY_CARE_PROVIDER_SITE_OTHER): Payer: HMO | Admitting: Family Medicine

## 2021-05-17 VITALS — BP 96/60 | HR 76 | Ht 63.5 in | Wt 139.6 lb

## 2021-05-17 DIAGNOSIS — M79672 Pain in left foot: Secondary | ICD-10-CM

## 2021-05-17 DIAGNOSIS — M79644 Pain in right finger(s): Secondary | ICD-10-CM

## 2021-05-17 MED ORDER — CHLORTHALIDONE 50 MG PO TABS: 50.0000 mg | ORAL_TABLET | Freq: Every day | ORAL | 0 refills | Status: DC

## 2021-05-17 NOTE — Patient Instructions (Addendum)
Good to see you today. ? ?Go to [employer]. ? ?Metatarsal pads through [email or web address] ? ?Please use Voltaren gel (Generic Diclofenac Gel) up to 4x daily for pain as needed.  This is available over-the-counter as both the name brand Voltaren gel and the generic diclofenac gel.  (For your hand) ? ?Follow-up: one month ?

## 2021-05-17 NOTE — Telephone Encounter (Signed)
Patient called because she has not received call the the [facility] nurse to review her medications and she was concerned about the prescription Vitamin K and the Chlorthalidone that she takes. ?I called [facility] and spoke with Langley Gauss, Therapist, sports. She said she will  pull her case and try to call her this afternoon. They are currently working on Monday's cases but she will try to reach her today. ?I spoke with patient and advise and reassure her the nurse will be calling soon. ?

## 2021-05-21 ENCOUNTER — Telehealth: Payer: Self-pay | Admitting: *Deleted

## 2021-05-21 ENCOUNTER — Encounter (HOSPITAL_BASED_OUTPATIENT_CLINIC_OR_DEPARTMENT_OTHER): Payer: Self-pay | Admitting: Obstetrics and Gynecology

## 2021-05-21 ENCOUNTER — Other Ambulatory Visit: Payer: Self-pay

## 2021-05-21 NOTE — Progress Notes (Signed)
Spoke w/ via phone for pre-op interview--- pt ?Lab needs dos----   cbc, bmp            ?Lab results------ no ?COVID test -----patient states asymptomatic no test needed ?Arrive at ------- 0930 on 05-22-2021 ?NPO after MN NO Solid Food.  Clear liquids from MN until--- 0830 ?Med rec completed ?Medications to take morning of surgery ----- none ?Diabetic medication ----- n/a ?Patient instructed no nail polish to be worn day of surgery ?Patient instructed to bring photo id and insurance card day of surgery ?Patient aware to have Driver (ride ) / caregiver for 24 hours after surgery ---friend, louise ?Patient Special Instructions ----- n/a ?Pre-Op special Istructions ----- n/a ?Patient verbalized understanding of instructions that were given at this phone interview. ?Patient denies shortness of breath, chest pain, fever, cough at this phone interview.  ?

## 2021-05-21 NOTE — Telephone Encounter (Signed)
Spoke with patient. Patient states she has not received call from [facility] Pre-op, wants to discuss RX medications. Number provided to patient. Patient will contact [facility] directly. Advised if no return call by 3pm today, return call to me directly to notify. Patient verbalizes understanding and is agreeable.  ? ?Encounter closed.  ?

## 2021-05-21 NOTE — Telephone Encounter (Addendum)
-----   Message from Leron Croak, [place] sent at 05/18/2021  3:44 PM EDT ----- ?[facility]  ?This patient called asking for you. She has some questions regarding her upcoming surgery on 05/22/21. Can you please give her a call. Thank you ? ? ?

## 2021-05-22 ENCOUNTER — Ambulatory Visit (HOSPITAL_COMMUNITY): Admission: RE | Admit: 2021-05-22 | Payer: PPO | Source: Ambulatory Visit | Admitting: Obstetrics and Gynecology

## 2021-05-22 ENCOUNTER — Other Ambulatory Visit: Payer: Self-pay

## 2021-05-22 ENCOUNTER — Ambulatory Visit (HOSPITAL_BASED_OUTPATIENT_CLINIC_OR_DEPARTMENT_OTHER): Payer: PPO | Admitting: Anesthesiology

## 2021-05-22 ENCOUNTER — Encounter (HOSPITAL_BASED_OUTPATIENT_CLINIC_OR_DEPARTMENT_OTHER): Payer: Self-pay | Admitting: Obstetrics and Gynecology

## 2021-05-22 ENCOUNTER — Encounter (HOSPITAL_BASED_OUTPATIENT_CLINIC_OR_DEPARTMENT_OTHER)
Admission: RE | Disposition: A | Discharge status: Home / Self Care | Payer: Self-pay | Source: Home / Self Care | Attending: Obstetrics and Gynecology

## 2021-05-22 ENCOUNTER — Ambulatory Visit (HOSPITAL_BASED_OUTPATIENT_CLINIC_OR_DEPARTMENT_OTHER)
Admission: RE | Admit: 2021-05-22 | Discharge: 2021-05-22 | Disposition: A | Discharge status: Home / Self Care | Payer: PPO | Attending: Obstetrics and Gynecology | Admitting: Obstetrics and Gynecology

## 2021-05-22 DIAGNOSIS — N840 Polyp of corpus uteri: Secondary | ICD-10-CM | POA: Diagnosis not present

## 2021-05-22 DIAGNOSIS — N858 Other specified noninflammatory disorders of uterus: Secondary | ICD-10-CM | POA: Diagnosis not present

## 2021-05-22 DIAGNOSIS — N950 Postmenopausal bleeding: Secondary | ICD-10-CM | POA: Diagnosis not present

## 2021-05-22 DIAGNOSIS — Z79899 Other long term (current) drug therapy: Secondary | ICD-10-CM | POA: Diagnosis not present

## 2021-05-22 DIAGNOSIS — I10 Essential (primary) hypertension: Secondary | ICD-10-CM | POA: Diagnosis not present

## 2021-05-22 DIAGNOSIS — Z9189 Other specified personal risk factors, not elsewhere classified: Secondary | ICD-10-CM | POA: Diagnosis not present

## 2021-05-22 DIAGNOSIS — R569 Unspecified convulsions: Secondary | ICD-10-CM | POA: Diagnosis not present

## 2021-05-22 DIAGNOSIS — N882 Stricture and stenosis of cervix uteri: Secondary | ICD-10-CM | POA: Diagnosis not present

## 2021-05-22 HISTORY — DX: Postmenopausal bleeding: N95.0

## 2021-05-22 HISTORY — DX: Other specified conditions associated with female genital organs and menstrual cycle: N94.89

## 2021-05-22 HISTORY — PX: DILATATION & CURETTAGE/HYSTEROSCOPY WITH MYOSURE: SHX6511

## 2021-05-22 HISTORY — DX: Meniere's disease, right ear: H81.01

## 2021-05-22 LAB — BASIC METABOLIC PANEL
Anion gap: 9 (ref 5–15)
BUN: 22 mg/dL (ref 8–23)
CO2: 30 mmol/L (ref 22–32)
Calcium: 9.7 mg/dL (ref 8.9–10.3)
Chloride: 101 mmol/L (ref 98–111)
Creatinine, Ser: 0.8 mg/dL (ref 0.44–1.00)
GFR, Estimated: >60 mL/min (ref >60)
Glucose, Bld: 91 mg/dL (ref 70–99)
Potassium: 3.3 mmol/L — ABNORMAL LOW (ref 3.5–5.1)
Sodium: 140 mmol/L (ref 135–145)

## 2021-05-22 LAB — CBC
HCT: 38.8 % (ref 36.0–46.0)
Hemoglobin: 13.6 g/dL (ref 12.0–15.0)
MCH: 32.8 pg (ref 26.0–34.0)
MCHC: 35.1 g/dL (ref 30.0–36.0)
MCV: 93.5 fL (ref 80.0–100.0)
Platelets: 179 10*3/uL (ref 150–400)
RBC: 4.15 MIL/uL (ref 3.87–5.11)
RDW: 12.2 % (ref 11.5–15.5)
WBC: 3.6 10*3/uL — ABNORMAL LOW (ref 4.0–10.5)
nRBC: 0 % (ref 0.0–0.2)

## 2021-05-22 SURGERY — DILATATION & CURETTAGE/HYSTEROSCOPY WITH MYOSURE
Anesthesia: General | Site: Uterus

## 2021-05-22 MED ORDER — LIDOCAINE HCL 1 % IJ SOLN
INTRAMUSCULAR | Status: DC | PRN
  Administered 2021-05-22: 10 mL

## 2021-05-22 MED ORDER — POVIDONE-IODINE 10 % EX SWAB: 2.0000 "application " | Freq: Once | CUTANEOUS | Status: DC

## 2021-05-22 MED ORDER — MIDAZOLAM HCL 5 MG/5ML IJ SOLN
INTRAMUSCULAR | Status: DC | PRN
  Administered 2021-05-22: 1 mg via INTRAVENOUS

## 2021-05-22 MED ORDER — FENTANYL CITRATE (PF) 100 MCG/2ML IJ SOLN: 25.0000 ug | INTRAMUSCULAR | Status: DC | PRN

## 2021-05-22 MED ORDER — SODIUM CHLORIDE 0.9 % IR SOLN
Status: DC | PRN
  Administered 2021-05-22: 3000 mL

## 2021-05-22 MED ORDER — GLYCOPYRROLATE PF 0.2 MG/ML IJ SOSY
PREFILLED_SYRINGE | INTRAMUSCULAR | Status: AC
  Filled 2021-05-22: qty 1

## 2021-05-22 MED ORDER — ONDANSETRON HCL 4 MG/2ML IJ SOLN
INTRAMUSCULAR | Status: DC | PRN
  Administered 2021-05-22: 4 mg via INTRAVENOUS

## 2021-05-22 MED ORDER — FENTANYL CITRATE (PF) 100 MCG/2ML IJ SOLN
INTRAMUSCULAR | Status: DC | PRN
  Administered 2021-05-22: 25 ug via INTRAVENOUS

## 2021-05-22 MED ORDER — DEXAMETHASONE SODIUM PHOSPHATE 10 MG/ML IJ SOLN
INTRAMUSCULAR | Status: AC
  Filled 2021-05-22: qty 1

## 2021-05-22 MED ORDER — DEXAMETHASONE SODIUM PHOSPHATE 10 MG/ML IJ SOLN
INTRAMUSCULAR | Status: DC | PRN
  Administered 2021-05-22: 10 mg via INTRAVENOUS

## 2021-05-22 MED ORDER — PROPOFOL 10 MG/ML IV BOLUS
INTRAVENOUS | Status: AC
  Filled 2021-05-22: qty 20

## 2021-05-22 MED ORDER — MIDAZOLAM HCL 2 MG/2ML IJ SOLN
INTRAMUSCULAR | Status: AC
  Filled 2021-05-22: qty 2

## 2021-05-22 MED ORDER — PROPOFOL 10 MG/ML IV BOLUS
INTRAVENOUS | Status: DC | PRN
  Administered 2021-05-22: 150 mg via INTRAVENOUS
  Administered 2021-05-22: 50 mg via INTRAVENOUS

## 2021-05-22 MED ORDER — LIDOCAINE 2% (20 MG/ML) 5 ML SYRINGE
INTRAMUSCULAR | Status: DC | PRN
Start: 2021-05-22 — End: 2021-05-22
  Administered 2021-05-22: 100 mg via INTRAVENOUS

## 2021-05-22 MED ORDER — PHENYLEPHRINE 80 MCG/ML (10ML) SYRINGE FOR IV PUSH (FOR BLOOD PRESSURE SUPPORT)
PREFILLED_SYRINGE | INTRAVENOUS | Status: AC
  Filled 2021-05-22: qty 10

## 2021-05-22 MED ORDER — ACETAMINOPHEN 500 MG PO TABS
ORAL_TABLET | ORAL | Status: AC
  Filled 2021-05-22: qty 2

## 2021-05-22 MED ORDER — ACETAMINOPHEN 10 MG/ML IV SOLN: 1000.0000 mg | Freq: Once | INTRAVENOUS | Status: DC | PRN

## 2021-05-22 MED ORDER — ONDANSETRON HCL 4 MG/2ML IJ SOLN
INTRAMUSCULAR | Status: AC
  Filled 2021-05-22: qty 2

## 2021-05-22 MED ORDER — PHENYLEPHRINE 80 MCG/ML (10ML) SYRINGE FOR IV PUSH (FOR BLOOD PRESSURE SUPPORT)
PREFILLED_SYRINGE | INTRAVENOUS | Status: DC | PRN
  Administered 2021-05-22 (×3): 160 ug via INTRAVENOUS
  Administered 2021-05-22: 240 ug via INTRAVENOUS

## 2021-05-22 MED ORDER — FENTANYL CITRATE (PF) 100 MCG/2ML IJ SOLN
INTRAMUSCULAR | Status: AC
  Filled 2021-05-22: qty 2

## 2021-05-22 MED ORDER — LACTATED RINGERS IV SOLN: INTRAVENOUS | Status: DC

## 2021-05-22 MED ORDER — OXYCODONE HCL 5 MG/5ML PO SOLN: 5.0000 mg | Freq: Once | ORAL | Status: DC | PRN

## 2021-05-22 MED ORDER — IBUPROFEN 600 MG PO TABS: 600.0000 mg | ORAL_TABLET | Freq: Four times a day (QID) | ORAL | 0 refills | Status: AC | PRN

## 2021-05-22 MED ORDER — GLYCOPYRROLATE PF 0.2 MG/ML IJ SOSY
PREFILLED_SYRINGE | INTRAMUSCULAR | Status: DC | PRN
  Administered 2021-05-22: .2 mg via INTRAVENOUS

## 2021-05-22 MED ORDER — OXYCODONE HCL 5 MG PO TABS: 5.0000 mg | ORAL_TABLET | Freq: Once | ORAL | Status: DC | PRN

## 2021-05-22 MED ORDER — ONDANSETRON HCL 4 MG/2ML IJ SOLN: 4.0000 mg | Freq: Once | INTRAMUSCULAR | Status: DC | PRN

## 2021-05-22 SURGICAL SUPPLY — 22 items
BLADE SURG 11 STRL SS (BLADE) ×2 IMPLANT
CATH ROBINSON RED A/P 16FR (CATHETERS) ×5 IMPLANT
DEVICE MYOSURE LITE (MISCELLANEOUS) IMPLANT
DEVICE MYOSURE REACH (MISCELLANEOUS) ×2 IMPLANT
DILATOR CANAL MILEX (MISCELLANEOUS) ×2 IMPLANT
DRSG TELFA 3X8 NADH (GAUZE/BANDAGES/DRESSINGS) ×3 IMPLANT
GAUZE 4X4 16PLY ~~LOC~~+RFID DBL (SPONGE) ×6 IMPLANT
GLOVE BIO SURGEON STRL SZ 6.5 (GLOVE) ×3 IMPLANT
GOWN STRL REUS W/TWL LRG LVL3 (GOWN DISPOSABLE) ×3 IMPLANT
IV NS IRRIG 3000ML ARTHROMATIC (IV SOLUTION) ×3 IMPLANT
KIT PROCEDURE FLUENT (KITS) ×3 IMPLANT
KIT TURNOVER CYSTO (KITS) ×3 IMPLANT
MYOSURE XL FIBROID (MISCELLANEOUS)
PACK VAGINAL MINOR WOMEN LF (CUSTOM PROCEDURE TRAY) ×3 IMPLANT
PAD DRESSING TELFA 3X8 NADH (GAUZE/BANDAGES/DRESSINGS) ×1 IMPLANT
PAD OB MATERNITY 4.3X12.25 (PERSONAL CARE ITEMS) ×3 IMPLANT
SEAL CERVICAL OMNI LOK (ABLATOR) IMPLANT
SEAL ROD LENS SCOPE MYOSURE (ABLATOR) ×3 IMPLANT
SYR TOOMEY IRRIG 70ML (MISCELLANEOUS) ×3
SYRINGE TOOMEY IRRIG 70ML (MISCELLANEOUS) ×1 IMPLANT
SYSTEM TISS REMOVAL MYOSURE XL (MISCELLANEOUS) IMPLANT
TOWEL OR 17X26 10 PK STRL BLUE (TOWEL DISPOSABLE) ×3 IMPLANT

## 2021-05-22 NOTE — Anesthesia Procedure Notes (Signed)
Procedure Name: LMA Insertion ?Date/Time: 05/22/2021 11:38 AM ?Performed by: Rogers Blocker, CRNA ?Pre-anesthesia Checklist: Patient identified, Emergency Drugs available, Suction available and Patient being monitored ?Patient Re-evaluated:Patient Re-evaluated prior to induction ?Oxygen Delivery Method: Circle System Utilized ?Preoxygenation: Pre-oxygenation with 100% oxygen ?Induction Type: IV induction ?Ventilation: Mask ventilation without difficulty ?LMA: LMA inserted ?LMA Size: 4.0 ?Number of attempts: 1 ?Placement Confirmation: positive ETCO2 ?Tube secured with: Tape ?Dental Injury: Teeth and Oropharynx as per pre-operative assessment  ? ? ? ? ?

## 2021-05-22 NOTE — Anesthesia Preprocedure Evaluation (Signed)
Anesthesia Evaluation  ?Patient identified by MRN, date of birth, ID band ?Patient awake ? ? ? ?Reviewed: ?Allergy & Precautions, NPO status , Patient's Chart, lab work & pertinent test results ? ?Airway ?Mallampati: II ? ?TM Distance: >3 FB ?Neck ROM: Full ? ? ? Dental ?no notable dental hx. ? ?  ?Pulmonary ?neg pulmonary ROS,  ?  ?Pulmonary exam normal ?breath sounds clear to auscultation ? ? ? ? ? ? Cardiovascular ?hypertension, Pt. on medications ?Normal cardiovascular exam ?Rhythm:Regular Rate:Normal ? ? ?  ?Neuro/Psych ?Seizures -, Well Controlled,  negative psych ROS  ? GI/Hepatic ?negative GI ROS, Gilbert's syndrome ?  ?Endo/Other  ?negative endocrine ROS ? Renal/GU ?negative Renal ROS  ?negative genitourinary ?  ?Musculoskeletal ?negative musculoskeletal ROS ?(+)  ? Abdominal ?  ?Peds ?negative pediatric ROS ?(+)  Hematology ?negative hematology ROS ?(+)   ?Anesthesia Other Findings ? ? Reproductive/Obstetrics ?negative OB ROS ? ?  ? ? ? ? ? ? ? ? ? ? ? ? ? ?  ?  ? ? ? ? ? ? ? ? ?Anesthesia Physical ?Anesthesia Plan ? ?ASA: 3 ? ?Anesthesia Plan: General  ? ?Post-op Pain Management: Minimal or no pain anticipated  ? ?Induction: Intravenous ? ?PONV Risk Score and Plan: 3 and Ondansetron, Dexamethasone, Droperidol and Treatment may vary due to age or medical condition ? ?Airway Management Planned: LMA ? ?Additional Equipment:  ? ?Intra-op Plan:  ? ?Post-operative Plan: Extubation in OR ? ?Informed Consent: I have reviewed the patients History and Physical, chart, labs and discussed the procedure including the risks, benefits and alternatives for the proposed anesthesia with the patient or authorized representative who has indicated his/her understanding and acceptance.  ? ? ? ?Dental advisory given ? ?Plan Discussed with: CRNA and Surgeon ? ?Anesthesia Plan Comments:   ? ? ? ? ? ? ?Anesthesia Quick Evaluation ? ?

## 2021-05-22 NOTE — Progress Notes (Signed)
Update to History and Physical ? ?No marked change in status since office preop visit.  ?No further vaginal bleeding.  ? ?Vitals:  ? 05/22/21 1010  ?BP: 123/79  ?Pulse: 66  ?Resp: 15  ?Temp: 97.8 ?F (36.6 ?C)  ?SpO2: 99%  ? ? ?Patient examined.  ? ?OK to proceed with surgery.  ? ?

## 2021-05-22 NOTE — Discharge Instructions (Addendum)
Ms. Wiens,  ? ?Surgery went well today! ?I was able to get into the uterine cavity without the ultrasound guidance.  ? ?I found and removed a polyp from the uterine cavity.  ?I also did the curettage of the uterus.  ? ?The polyp and the curettage specimen will be sent for biopsy.  ?I hope to have the results before the end of the week.  ? ?Please know that I am out of the office on Friday and will return on Monday next week.  ? ?Josefa Half, MD ? ? ? ?Post Anesthesia Home Care Instructions ? ?Activity: ?Get plenty of rest for the remainder of the day. A responsible individual must stay with you for 24 hours following the procedure.  ?For the next 24 hours, DO NOT: ?-Drive a car ?-Paediatric nurse ?-Drink alcoholic beverages ?-Take any medication unless instructed by your physician ?-Make any legal decisions or sign important papers. ? ?Meals: ?Start with liquid foods such as gelatin or soup. Progress to regular foods as tolerated. Avoid greasy, spicy, heavy foods. If nausea and/or vomiting occur, drink only clear liquids until the nausea and/or vomiting subsides. Call your physician if vomiting continues. ? ?Special Instructions/Symptoms: ?Your throat may feel dry or sore from the anesthesia or the breathing tube placed in your throat during surgery. If this causes discomfort, gargle with warm salt water. The discomfort should disappear within 24 hours. ? ?If you had a scopolamine patch placed behind your ear for the management of post- operative nausea and/or vomiting: ? ?1. The medication in the patch is effective for 72 hours, after which it should be removed.  Wrap patch in a tissue and discard in the trash. Wash hands thoroughly with soap and water. ?2. You may remove the patch earlier than 72 hours if you experience unpleasant side effects which may include dry mouth, dizziness or visual disturbances. ?3. Avoid touching the patch. Wash your hands with soap and water after contact with the patch. ?    ? ? No  Tylenol/acetaminophen products until 5:20pm ? ?

## 2021-05-22 NOTE — H&P (Signed)
Office Visit ?05/10/2021 ?[facility] ?Nunzio Cobbs, MD ?Obstetrics and Gynecology Postmenopausal bleeding +4 more ?Dx Procedure ; Referred by Panosh, Standley Brooking, MD ?Reason for Visit  ? ?Additional Documentation ? ?Vitals:  BP 122/80 ?Ht 5' 3.5" (1.613 m) ?Wt 63.5 kg ?BMI 24.41 kg/m? ?BSA 1.69 m? ? ?More Vitals  ?Flowsheets:  Anthropometrics, ?MEWS Score, ?NEWS ?  ?Encounter Info:  Billing Info, ?History, ?Allergies, ?Detailed Report ?  ? ?All Notes ? ? ? Progress Notes by Nunzio Cobbs, MD at 05/10/2021 2:15 PM ? ?Author: Nunzio Cobbs, MD Author Type: Physician Filed: 05/11/2021  8:04 AM  ?Note Status: Signed Cosign: Cosign Not Required Encounter Date: 05/10/2021  ?Editor: Nunzio Cobbs, MD (Physician)      ?Prior Versions: 1. Nunzio Cobbs, MD (Physician) at 05/10/2021  2:41 PM - Sign when Signing Visit  ? 2. Lowella Fairy, CMA (Certified Medical Assistant) at 05/10/2021  2:07 PM - Sign when Signing Visit  ?  ?GYNECOLOGY  VISIT ?  ?HPI: ?71 y.o.   Married  Caucasian  female   ?[id number] with No LMP recorded. Patient is postmenopausal.   ?here for pelvic ultrasound and possible EMB.  ?  ?Had postmenopausal bleeding about 2 weeks ago.  ?Unable to do office endometrial biopsy on 05/07/21 due to a cervix flush with the vagina.  ?  ?Had a prior D & C due to miscarriage.  ?  ?Glucose 135 this week.  ?  ?Traveling 5/13 - 6/5.  ?  ?GYNECOLOGIC HISTORY: ?No LMP recorded. Patient is postmenopausal. ?Contraception:  PMP ?Menopausal hormone therapy:  none ?Last mammogram:  03-16-21 Neg/[facility]Rads1 ?Last pap smear:    04-30-21 pending.  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 ?       ?OB History   ?  ?  Gravida  ?3  ? Para  ?2  ? Term  ?   ? Preterm  ?   ? AB  ?1  ? Living  ?2  ?  ?  ?  SAB  ?1  ? IAB  ?   ? Ectopic  ?   ? Multiple  ?   ? Live Births  ?   ?    ?  ?  Obstetric Comments  ?Spontaneously abortion on 3rd  pg triploid dna on path  ?  ?  ?   ?    ?  ?    ?Patient Active Problem List  ?  Diagnosis Date Noted  ? Right lateral epicondylitis 04/27/2020  ? Paresthesias 01/12/2019  ? AKI (acute kidney injury) ([place]) 01/12/2019  ? Essential hypertension 01/12/2019  ? Hypercalcemia 01/12/2019  ? Hypokalemia 01/12/2019  ? Bradycardia 01/12/2019  ? Radial head fracture 10/23/2017  ? 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 Primary osteoarthritis of both hands 06/05/2016  ? DJD (degenerative joint disease), cervical 05/08/2016  ? Female genital lesion 08/12/2014  ? Osteoporosis 07/20/2014  ? Elevated LFTs 05/21/2013  ? Hemochromatosis carrier 02/23/2013  ? 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 Meniere's disease 10/14/2011  ? Skin lesion 08/14/2011  ? Right shoulder strain 08/13/2011  ? Phlebitis, superficial 08/16/2010  ? DECREASED HEARING, RIGHT EAR 11/08/2009  ? PROBLEMS WITH HEARING 11/08/2009  ? DYSLIPIDEMIA 10/07/2008  ? 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 FIBROIDS, UTERUS 05/29/2007  ? LIVER HEMANGIOMA 05/29/2007  ?  COLONIC POLYPS, HX OF 05/29/2007  ?  ?  ?    ?Past Medical History:  ?Diagnosis Date  ? Abnormal laboratory test result elevated alpha feto protein  ? Adjustment reaction with anxiety and depression 06/23/2011  ? Arthritis    ? Cataract    ?  developing  ? Femur fracture, right ([place]) 2003  ? Fibroids    ? Gilbert's syndrome    ? 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 History of liver biopsy nl    ?  records of fatty liver 2008 not confirmed   ? Hx of adenomatous colonic polyps    ? Meniere disease    ?  Right ear  ? Osteoporosis    ? Osteoporosis    ? Other hemochromatosis    ?  heterozygosity for h63d gene  ? Seizure disorder ([place])    ? Spontaneous abortion    ?  ?  ?     ?Past Surgical History:  ?Procedure Laterality Date  ? BREAST CYST EXCISION Right    ? [place]  ? COLONOSCOPY   220, 2004, 2007, 08/13/2010  ?  2002: 8 mm polyp 2004: no  polyps 2007: no polyps 2012: hemorrhoids  ? hosp r/o mi cv right chest and jaw pain   2010  ? LIVER BIOPSY      ?  2015 negative   no fatty liver  records  2008 said fatty liver    ? OVARIAN CYST REMOVAL      ? POLYPECTOMY      ? spontaneously abortion on 3rd preg      ?  triploid dna on path  ?  ?  ?      ?Current Outpatient Medications  ?Medication Sig Dispense Refill  ? aspirin EC 81 MG tablet Take 81 mg by mouth daily.      ? Calcium Carb-Cholecalciferol 600-800 MG-UNIT TABS Take 1 tablet by mouth in the morning and at bedtime.       ? chlorthalidone (HYGROTON) 50 MG tablet Take 50 mg by mouth daily.      ? diazepam (VALIUM) 2 MG tablet Take 2 mg by mouth as needed (for Meniere's flare up).       ? divalproex (DEPAKOTE ER) 500 MG 24 hr tablet Take 1 tablet (500 mg total) by mouth at bedtime. 90 tablet 3  ? ondansetron (ZOFRAN-ODT) 4 MG disintegrating tablet Take 4 mg by mouth every 8 (eight) hours as needed.    1  ? potassium chloride SA (KLOR-CON M) 20 MEQ tablet Take 1 tablet (20 mEq total) by mouth daily. 30 tablet 3  ? Turmeric 500 MG CAPS Take 1 capsule by mouth daily.       ?  ?         ?Current Facility-Administered Medications  ?Medication Dose Route Frequency Provider Last Rate Last Admin  ? 0.9 %  sodium chloride infusion  500 mL Intravenous Once Gatha Mayer, MD      ?  ?  ?ALLERGIES: Penicillins ?  ?     ?Family History  ?Problem Relation Age of Onset  ? Colon cancer Father    ?      dx'd late to mid 40's  ? Other Father    ?      tinnitus  ? Heart attack Father    ? Depression Mother    ? Anxiety disorder Mother    ? Dementia Mother    ?  Other Mother    ?      high calcium  ? Hearing loss Mother    ?      death  ? Pulmonary disease Mother    ? Stroke Other    ?      silent  ? Other Brother    ?      elevated LFTs  ? Healthy Daughter    ? Healthy Daughter    ? Colon polyps Neg Hx    ? Esophageal cancer Neg Hx    ? Rectal cancer Neg Hx    ? Stomach cancer Neg Hx    ? Breast cancer Neg Hx    ?  ?   ?Social History  ?  ?     ?Socioeconomic History  ? Marital status: Married  ?    Spouse name: Sandria Bales  ? Number of children: 2  ? Years of education: Not on file  ? Highest education level: Not on file  ?Occupational History  ? Not on file  ?Tobacco Use  ? Smoking status: Never  ? Smokeless tobacco: Never  ?Vaping Use  ? Vaping Use: Never used  ?Substance and Sexual Activity  ? Alcohol use: Yes  ?    Alcohol/week: 1.0 - 2.0 standard drink  ?    Types: 1 - 2 Glasses of wine per week  ?    Comment: glass of wine a week  ? Drug use: No  ? Sexual activity: Yes  ?Other Topics Concern  ? Not on file  ?Social History Narrative  ?  Living alone til 2023, husband works out of town  ?  2 daughters  ?  Husband a Rabbi   ?  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  Travels   ?  Drinks 1 cup of caffeine daily  ?  Sleep  Often disrupted.   ?  Alcohol about once a week.  ?  [place] 2 pet dogs  ?  Right Handed  ?  ?Social Determinants of Health  ?  ?Financial Resource Strain: Not on file  ?Food Insecurity: Not on file  ?Transportation Needs: Not on file  ?Physical Activity: Not on file  ?Stress: Not on file  ?Social Connections: Not on file  ?Intimate Partner Violence: Not on file  ?  ?  ?Review of Systems  ?All other systems reviewed and are negative. ?  ?PHYSICAL EXAMINATION:   ?  ?BP 122/80   Ht 5' 3.5" (1.613 m)   Wt 140 lb (63.5 kg)   BMI 24.41 kg/m?     ?General appearance: alert, cooperative and appears stated age ?  ?Pelvic US ?Uterus 5.58 x 2.75 x 2.11 cm. ?EMS 4.71 mm.  Fluid with debris in the endometrial canal.  0.4 x 0.2 cm echogenic focus.  Possible polyp versus calcification.  ?Left ovary 1.57 x 1.67 x 0.98 cm.  ?Right ovary 2.22 x 0.86 x 1.36 cm.  ?Right para ovarian cyst 1.0 - 0.8 cm.  ?No free fluid.  ?  ?ASSESSMENT ?  ?Postmenopausal bleeding.  ?Fluid in endometrial canal.  ?EMS over 3 mm.  Possible mass.  ?Cervical stenosis.  ?Right para-ovarian cyst.  ?Elevated blood sugar. ?  ?PLAN ?  ?We  reviewed US findings and report.  ?CA125.  ?A1C.  ?I recommended hysteroscopy with possible Myosure resection of endometrial mass, and dilation and curettage under ultrasound guidance.  ?Risks, benefits, and

## 2021-05-22 NOTE — Op Note (Signed)
OPERATIVE REPORT ? ? ?PREOPERATIVE DIAGNOSES:   Postmenopausal bleeding, endometrial mass, cervical stenosis, DES exposure. ? ?POSTOPERATIVE DIAGNOSES:   Postmenopausal bleeding, endometrial polyp, DES exposure. ? ?PROCEDURE:  Hysteroscopy with dilation and curettage and Myosure resection of endometrial polyp ? ?SURGEON:  Lenard Galloway, MD ? ?ANESTHESIA:  LMA, paracervical block with 10 mL of 1% lidocaine. ? ?IV FLUIDS:  700 cc LR ? ?EBL:   5 cc ? ?URINE OUTPUT:   10 cc at the beginning of the surgery and 2 cc at the end of the surgery.  Urine was clear and yellow.  ? ?NORMAL SALINE DEFICIT:   70 cc LR ? ?COMPLICATIONS:  None. ? ?INDICATIONS FOR THE PROCEDURE:    ? ?The patient is a 71 year old [id number] Caucasian female with a history of DES exposure who presents with postmenopausal bleeding.  The patient's pap was normal and her high risk HPV testing was negative.  Pelvic ultrasound showed an endometrial stripe of 4.71 mm and a 4 x 2 mm echogenic focus.  There was fluid inside the endometrial canal.  An office endometrial biopsy was not successful as the cervix appeared to be stenotic and flush with the vagina ?  ?A plan is now made to proceed with an ultrasound guided hysteroscopy with dilation and curettage and Myosurgical resection of endometrial mass; after risks, benefits and alternatives were reviewed. ? ?FINDINGS:  Exam under anesthesia revealed a small anteverted, mobile uterus.  No adnexal masses were noted.  ?The uterus was sounded to 6.5 cm.  ?Hysteroscopy showed a 1 cm polyp attached to the left posterior cervico-uterine junction.  ?The tubal ostia regions were normal.   ?The uterus was narrow and somewhat T shaped. ?Endometrial currettings were scant. ? ?SPECIMENS:  The endometrial polyp and endometrial curettings were sent to Pathology separately.  ? ?PROCEDURE IN DETAIL:  The patient was reidentified in the preoperative ?hold area.  She received TED hose and PAS stockings for DVT prophylaxis. ? ?In  the operating room, the patient was placed in the dorsal lithotomy ?position and then an LMA anesthetic was introduced.  The patient's lower ?abdomen, vagina and perineum were sterilely prepped with Betadine and the  ?patient's bladder was catheterized of urine.  She was sterilly draped. ? ?An exam under anesthesia was performed. ? ?A weighted speculum and a deaver retractor were placed inside the vagina and a single-tooth tenaculum was placed on the anterior cervical lip.  The cervical os could be identified.  An os finder was used and this passed easily into the uterine canal.  The uterus was sounded. ? ?The cervix was dilated to a #21 Pratt dilator.  The MyoSure hysteroscope ?was then inserted inside the uterine cavity under the continuous infusion of normal saline solution.   ?Findings are as noted above.  The Myosure Reach device was used to resect the endometrial polyp without difficulty.  The specimen was sent to Pathology.  ?The MyoSure hysteroscope was removed. ? ?The serrated and then sharp curettes were introduced into the uterine cavity and the endometrium was curetted in all 4 quadrants.   ?A minimal amount of endometrial curettings was obtained.  This specimen was sent to Pathology. ? ?A paracervical block was performed with a total of 10 mL of 1% lidocaine plain. ? ?The single-tooth tenaculum which had been placed on the anterior cervical lip was removed.   There appeared to be an abrasion of the left vaginal sidewall from the speculum.  No suturing was necessary.  ? ?Hemostasis  was good, and all of the vaginal instruments were removed. ? ?The patient was awakened and escorted to the recovery room in stable ?condition after she was cleansed of Betadine.  There were no complications to the procedure.   ? ?All needle, instrument and sponge counts were correct. ? ?Lenard Galloway, MD ? ?

## 2021-05-22 NOTE — Transfer of Care (Signed)
Immediate Anesthesia Transfer of Care Note ? ?Patient: Donna Warren ? ?Procedure(s) Performed: DILATATION & CURETTAGE/HYSTEROSCOPY WITH MYOSURE (Uterus) ? ?Patient Location: PACU ? ?Anesthesia Type:General ? ?Level of Consciousness: drowsy and responds to stimulation ? ?Airway & Oxygen Therapy: Patient Spontanous Breathing ? ?Post-op Assessment: Report given to RN and Post -op Vital signs reviewed and stable ? ?Post vital signs: Reviewed and stable ? ?Last Vitals:  ?Vitals Value Taken Time  ?BP 100/59 05/22/21 1227  ?Temp 36.3 ?C 05/22/21 1226  ?Pulse 64 05/22/21 1229  ?Resp 11 05/22/21 1229  ?SpO2 97 % 05/22/21 1229  ?Vitals shown include unvalidated device data. ? ?Last Pain:  ?Vitals:  ? 05/22/21 1010  ?TempSrc: Oral  ?PainSc: 0-No pain  ?   ? ?Patients Stated Pain Goal: 6 (05/22/21 1010) ? ?Complications: No notable events documented. ?

## 2021-05-23 ENCOUNTER — Encounter (HOSPITAL_BASED_OUTPATIENT_CLINIC_OR_DEPARTMENT_OTHER): Payer: Self-pay | Admitting: Obstetrics and Gynecology

## 2021-05-23 LAB — SURGICAL PATHOLOGY

## 2021-05-23 NOTE — Anesthesia Postprocedure Evaluation (Signed)
Anesthesia Post Note ? ?Patient: Donna Warren ? ?Procedure(s) Performed: DILATATION & CURETTAGE/HYSTEROSCOPY WITH MYOSURE (Uterus) ? ?  ? ?Patient location during evaluation: PACU ?Anesthesia Type: General ?Level of consciousness: awake ?Pain management: pain level controlled ?Vital Signs Assessment: post-procedure vital signs reviewed and stable ?Respiratory status: spontaneous breathing and respiratory function stable ?Cardiovascular status: stable ?Postop Assessment: no apparent nausea or vomiting ?Anesthetic complications: no ? ? ?No notable events documented. ? ?Last Vitals:  ?Vitals:  ? 05/22/21 1245 05/22/21 1400  ?BP: (!) 111/41 129/65  ?Pulse: 66 (!) 54  ?Resp: 16 16  ?Temp:    ?SpO2: 98% 100%  ?  ?Last Pain:  ?Vitals:  ? 05/22/21 1400  ?TempSrc:   ?PainSc: 0-No pain  ? ? ?  ?  ?  ?  ?  ?  ? ?Merlinda Frederick ? ? ? ? ?

## 2021-05-31 ENCOUNTER — Encounter: Payer: Self-pay | Admitting: Obstetrics and Gynecology

## 2021-05-31 ENCOUNTER — Ambulatory Visit (INDEPENDENT_AMBULATORY_CARE_PROVIDER_SITE_OTHER): Payer: PPO | Admitting: Obstetrics and Gynecology

## 2021-05-31 VITALS — BP 110/60 | Ht 63.5 in | Wt 140.0 lb

## 2021-05-31 DIAGNOSIS — Z9889 Other specified postprocedural states: Secondary | ICD-10-CM

## 2021-05-31 NOTE — Progress Notes (Signed)
GYNECOLOGY  VISIT ?  ?HPI: ?71 y.o.   Married  Caucasian  female   ?[id number] with No LMP recorded. Patient is postmenopausal.   ?here for 1 week status post ?[place] (Uterus)Procedure. ? ?Final pathology report showed benign endometrial polyp and atrophic endometrium.  ? ?Post op course went OK. ?No cramping.  ?Minimal bleeding. ?Did not take pain medication.  ? ?Moving away for a year.  ? ?GYNECOLOGIC HISTORY: ?No LMP recorded. Patient is postmenopausal. ?Contraception:  PMP ?Menopausal hormone therapy:  none ?Last mammogram:  03-16-21 Neg/BiRads1 ?Last pap smear: 04-30-21 Neg:Neg HR HPV.  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 ?       ?OB History   ? ? Gravida  ?3  ? Para  ?2  ? Term  ?   ? Preterm  ?   ? AB  ?1  ? Living  ?2  ?  ? ? SAB  ?1  ? IAB  ?   ? Ectopic  ?   ? Multiple  ?   ? Live Births  ?   ?   ?  ? Obstetric Comments  ?Spontaneously abortion on 3rd pg triploid dna on path  ?  ? ?  ?    ? ?Patient Active Problem List  ? Diagnosis Date Noted  ? Right lateral epicondylitis 04/27/2020  ? Paresthesias 01/12/2019  ? AKI (acute kidney injury) ([place]) 01/12/2019  ? Essential hypertension 01/12/2019  ? Hypercalcemia 01/12/2019  ? Hypokalemia 01/12/2019  ? Bradycardia 01/12/2019  ? 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 Primary osteoarthritis of both hands 06/05/2016  ? DJD (degenerative joint disease), cervical 05/08/2016  ? Female genital lesion 08/12/2014  ? Osteoporosis 07/20/2014  ? Elevated LFTs 05/21/2013  ? Hemochromatosis carrier 02/23/2013  ? 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 Meniere's disease 10/14/2011  ? Skin lesion 08/14/2011  ? Right shoulder strain 08/13/2011  ? Phlebitis, superficial 08/16/2010  ? DECREASED HEARING, RIGHT EAR 11/08/2009  ? PROBLEMS WITH HEARING 11/08/2009  ? DYSLIPIDEMIA 10/07/2008  ? 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 FIBROIDS, UTERUS  05/29/2007  ? LIVER HEMANGIOMA 05/29/2007  ? COLONIC POLYPS, HX OF 05/29/2007  ? ? ?Past Medical History:  ?Diagnosis Date  ? Arthritis   ? right elbow  ? Endometrial mass   ? Endometrial mass   ? Gilbert's syndrome   ? followed by pcp---  pt stated bilirubin as been normal since approx 2015,  hx liver bx negative in 2015  ? 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 Hx of adenomatous colonic polyps   ? Meniere disease, right   ? followed by dr Thornell Mule  ? Osteoporosis   ? Other hemochromatosis   ? 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 PMB (postmenopausal bleeding)   ? Seizure disorder ([place])   ? neurologist--- dr Leonie Man---  dx 12/ 2020 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;  (05-21-2021 pt stated last seizure 2020)  ? ? ?Past Surgical History:  ?Procedure Laterality Date  ? BREAST CYST EXCISION Right 1970  ? [place]  ? COLONOSCOPY  03/20/2018  ? DILATATION & CURETTAGE/HYSTEROSCOPY WITH MYOSURE N/A 05/22/2021  ? Procedure: DILATATION & CURETTAGE/HYSTEROSCOPY WITH MYOSURE;  Surgeon: Nunzio Cobbs, MD;  [facility];  Service: Gynecology;  Laterality: N/A;  ? EXCISION VAGINAL CYST  1970  ? ? ?Current Outpatient  Medications  ?Medication Sig Dispense Refill  ? aspirin EC 81 MG tablet Take 81 mg by mouth daily.    ? Calcium Carb-Cholecalciferol 600-800 MG-UNIT TABS Take 1 tablet by mouth in the morning and at bedtime.     ? chlorthalidone (HYGROTON) 50 MG tablet Take 1 tablet (50 mg total) by mouth daily. (Patient taking differently: Take 50 mg by mouth daily.) 90 tablet 0  ? diazepam (VALIUM) 2 MG tablet Take 2 mg by mouth as needed (for Meniere's flare up).    ? divalproex (DEPAKOTE ER) 500 MG 24 hr tablet Take 1 tablet (500 mg total) by mouth at bedtime. (Patient taking differently: Take 500 mg by mouth at bedtime.) 90 tablet 3  ? ibuprofen (ADVIL) 600 MG tablet Take 1 tablet (600 mg total) by mouth every 6 (six) hours as needed. 30 tablet 0  ?  Multiple Vitamin (MULTIVITAMIN) tablet Take 1 tablet by mouth daily.    ? ondansetron (ZOFRAN-ODT) 4 MG disintegrating tablet Take 4 mg by mouth every 8 (eight) hours as needed.   1  ? potassium chloride SA (KLOR-CON M) 20 MEQ tablet Take 1 tablet (20 mEq total) by mouth daily. (Patient taking differently: Take 20 mEq by mouth at bedtime.) 30 tablet 3  ? Turmeric 500 MG CAPS Take 1 capsule by mouth daily.     ? ?No current facility-administered medications for this visit.  ?  ? ?ALLERGIES: Penicillins ? ?Family History  ?Problem Relation Age of Onset  ? Colon cancer Father   ?     dx'd late to mid 27's  ? Other Father   ?     tinnitus  ? Heart attack Father   ? Depression Mother   ? Anxiety disorder Mother   ? Dementia Mother   ? Other Mother   ?     high calcium  ? Hearing loss Mother   ?     death  ? Pulmonary disease Mother   ? Stroke Other   ?     silent  ? Other Brother   ?     elevated LFTs  ? Healthy Daughter   ? Healthy Daughter   ? Colon polyps Neg Hx   ? Esophageal cancer Neg Hx   ? Rectal cancer Neg Hx   ? Stomach cancer Neg Hx   ? Breast cancer Neg Hx   ? ? ?Social History  ? ?Socioeconomic History  ? Marital status: Married  ?  Spouse name: Sandria Bales  ? Number of children: 2  ? Years of education: Not on file  ? Highest education level: Not on file  ?Occupational History  ? Not on file  ?Tobacco Use  ? Smoking status: Never  ? Smokeless tobacco: Never  ?Vaping Use  ? Vaping Use: Never used  ?Substance and Sexual Activity  ? Alcohol use: Yes  ?  Alcohol/week: 1.0 - 2.0 standard drink  ?  Types: 1 - 2 Glasses of wine per week  ? Drug use: Never  ? Sexual activity: Yes  ?  Birth control/protection: Post-menopausal  ?Other Topics Concern  ? Not on file  ?Social History Narrative  ? Living alone til 2023, husband works out of town  ? 2 daughters  ? Husband a Rabbi   ? 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  Travels   ? Drinks 1 cup of caffeine daily  ? Sleep  Often disrupted.   ? Alcohol  about once a week.  ?  [place] 2 pet dogs  ? Right Handed  ? ?Social Determinants of Health  ? ?Financial Resource Strain: Not on file  ?Food Insecurity: Not on file  ?Transportation Needs: Not on file  ?Physical Activity: Not on file  ?Stress: Not on file  ?Social Connections: Not on file  ?Intimate Partner Violence: Not on file  ? ? ?Review of Systems  ?All other systems reviewed and are negative. ? ?PHYSICAL EXAMINATION:   ? ?BP 110/60   Ht 5' 3.5" (1.613 m)   Wt 140 lb (63.5 kg)   BMI 24.41 kg/m?     ?General appearance: alert, cooperative and appears stated age ? ?Pelvic: External genitalia:  no lesions ?             Urethra:  normal appearing urethra with no masses, tenderness or lesions ?             Bartholins and Skenes: normal    ?             Vagina: normal appearing vagina with normal color and discharge, no lesions ?             Cervix: no lesions.  Cervix is somewhat flush with vaginal cuff.  ?               ?Bimanual Exam:  Uterus:  normal size, contour, position, consistency, mobility, non-tender ?             Adnexa: no mass, fullness, tenderness ? ? ?Chaperone was present for exam:  yes. ? ?ASSESSMENT ? ?Status post hysteroscopic polypectomy, dilation and curettage.  ?Hx DES exposure.   ? ?PLAN ? ?Surgical findings, procedure, and pathology report reviewed with patient.  ?I recommend yearly GYN visits and paps.  ?Fu prn.  ?  ?An After Visit Summary was printed and given to the patient. ? ? ? ? ? ?

## 2021-06-27 ENCOUNTER — Ambulatory Visit (INDEPENDENT_AMBULATORY_CARE_PROVIDER_SITE_OTHER): Payer: HMO | Admitting: Family Medicine

## 2021-06-27 DIAGNOSIS — M7741 Metatarsalgia, right foot: Secondary | ICD-10-CM

## 2021-06-27 DIAGNOSIS — M7742 Metatarsalgia, left foot: Secondary | ICD-10-CM

## 2021-06-27 NOTE — Patient Instructions (Signed)
Thank you for coming in today.   Check back as needed 

## 2021-06-27 NOTE — Progress Notes (Signed)
   I, Peterson Lombard, LAT, ATC acting as a scribe for [name], MD.  Donna Warren is a 71 y.o. female who presents to [place] at [facility] today for f/u L foot and R thumb pain. Pt was last seen by Dr. Georgina Snell on 05/17/21 and was advised to visit [facility] to get metatarsal pads and to use Voltaren gel for her thumb. Today, pt reports L foot is much improved. She has been able to do a lot of walking while traveling over the last 3 weeks, without issues. R thumb pain has resolved.  Dx imaging: 05/15/21 L foot XR  Pertinent review of systems: No fevers or chills  Relevant historical information: History of kidney disease.   Exam:  BP 116/78   Ht 5' 3.5" (1.613 m)   Wt 141 lb 12.8 oz (64.3 kg)   BMI 24.72 kg/m  General: Well Developed, well nourished, and in no acute distress.   MSK: Feet bilaterally normal. Small plantar nodule palpated right foot otherwise however no tenderness to palpation.  Normal foot and ankle motion. Thumbs nontender bilaterally..      Assessment and Plan: 71 y.o. female with metatarsalgia significantly improved with metatarsal pads.  Thumb pain has resolved spontaneously.  Watchful waiting and continued use of metatarsal pads indefinitely.  Recheck back with me as needed.  It seems that this problem is managed quite well with a very pragmatic solution of metatarsal pads.  Check back with me if needed.  Precautions and indications for return reviewed. Total encounter time 20 minutes including face-to-face time with the patient and, reviewing past medical record, and charting on the date of service.     Discussed warning signs or symptoms. Please see discharge instructions. Patient expresses understanding.   The above documentation has been reviewed and is accurate and complete [name], M.D.

## 2021-06-28 DIAGNOSIS — H9313 Tinnitus, bilateral: Secondary | ICD-10-CM | POA: Diagnosis not present

## 2021-06-28 DIAGNOSIS — H9311 Tinnitus, right ear: Secondary | ICD-10-CM | POA: Diagnosis not present

## 2021-06-28 DIAGNOSIS — H903 Sensorineural hearing loss, bilateral: Secondary | ICD-10-CM | POA: Diagnosis not present

## 2021-06-28 DIAGNOSIS — H9201 Otalgia, right ear: Secondary | ICD-10-CM | POA: Diagnosis not present

## 2021-06-28 DIAGNOSIS — H9041 Sensorineural hearing loss, unilateral, right ear, with unrestricted hearing on the contralateral side: Secondary | ICD-10-CM | POA: Diagnosis not present

## 2021-06-28 DIAGNOSIS — H8101 Meniere's disease, right ear: Secondary | ICD-10-CM | POA: Diagnosis not present

## 2021-07-03 NOTE — Progress Notes (Signed)
Chief Complaint  Patient presents with   Annual Exam    Not fasting     HPI: Patient  Donna Warren  71 y.o. comes in today for Preventive Health Care visit  and cdm update  Bp has been well   on med for menieres . Menieres: has had recent check per Dr Thornell Mule:  hearing   right  slightly better   increased:  on CTD 50  and potassium 20 per day  no se reported  Hx of DES exposures    had  gyne evaluation for bleeding  /benign polyps  utd exam  Foot pain :   Inserts help.  Metatarsalgia  On depakote  per neuro for idiopathic  seizure disorder  and no sx   Health Maintenance  Topic Date Due   COVID-19 Vaccine (6 - Booster for Pfizer series) 11/14/2021 (Originally 12/02/2020)   INFLUENZA VACCINE  08/21/2021   MAMMOGRAM  03/17/2023   COLONOSCOPY (Pts 45-70yr Insurance coverage will need to be confirmed)  03/21/2023   TETANUS/TDAP  12/06/2027   Pneumonia Vaccine 71 Years old  Completed   DEXA SCAN  Completed   Hepatitis C Screening  Completed   Zoster Vaccines- Shingrix  Completed   HPV VACCINES  Aged Out   Health Maintenance Review LIFESTYLE:  Exercise:  better  2 miles per day .  Tobacco/ETS: no Alcohol:   1 per week . 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)   PMB (postmenopausal bleeding)    Seizure disorder ([facility]    neurologist--- dr sLeonie Man--  dx 12/ 2020  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;  (05-21-2021 pt stated last seizure 2020)    Past Surgical History:  Procedure Laterality Date   BREAST CYST EXCISION Right [place]  COLONOSCOPY  03/20/2018   DILATATION & CURETTAGE/HYSTEROSCOPY WITH MYOSURE N/A 05/22/2021   Procedure: [place]  Surgeon: ANunzio Cobbs MD;  [place]  Service: Gynecology;  Laterality: N/A;   EXCISION VAGINAL CYST  1970    Family History  Problem Relation Age of Onset   Colon cancer Father        dx'd late to mid 760's  Other Father        tinnitus   Heart attack Father    Depression Mother    Anxiety disorder Mother    Dementia Mother    Other Mother        high calcium   Hearing loss Mother        death   Pulmonary disease Mother    Stroke Other  silent   Other Brother        elevated LFTs   Healthy Daughter    Healthy Daughter    Colon polyps Neg Hx    Esophageal cancer Neg Hx    Rectal cancer Neg Hx    Stomach cancer Neg Hx    Breast cancer Neg Hx     Social History   Socioeconomic History   Marital status: Married    Spouse name: Sandria Bales   Number of children: 2   Years of education: Not on file   Highest education level: Not on file  Occupational History   Not on file  Tobacco Use   Smoking status: Never   Smokeless tobacco: Never  Vaping Use   Vaping Use: Never used  Substance and Sexual Activity   Alcohol use: Yes    Alcohol/week: 1.0 - 2.0 standard drink of alcohol    Types: 1 - 2 Glasses of wine per week   Drug use: Never   Sexual activity: Yes    Birth control/protection: Post-menopausal  Other Topics Concern   Not on file  Social History Narrative   Living alone til 2023, husband works out of town   2 daughters   Husband a Location manager    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  Travels    Drinks 1 cup of caffeine  daily   Sleep  Often disrupted.    Alcohol about once a week.   [place] 2 pet dogs   Right Handed   Social Determinants of Health   Financial Resource Strain: Low Risk  (10/18/2019)   Overall Financial Resource Strain (CARDIA)    Difficulty of Paying Living Expenses: Not hard at all  Food Insecurity: Not on file  Transportation Needs: No Transportation Needs (10/18/2019)   PRAPARE - Hydrologist (Medical): No    Lack of Transportation (Non-Medical): No  Physical Activity: Not on file  Stress: Not on file  Social Connections: Not on file    Outpatient Medications Prior to Visit  Medication Sig Dispense Refill   aspirin EC 81 MG tablet Take 81 mg by mouth daily.     Calcium Carb-Cholecalciferol 600-800 MG-UNIT TABS Take 1 tablet by mouth in the morning and at bedtime.      diazepam (VALIUM) 2 MG tablet Take 2 mg by mouth as needed (for Meniere's flare up).     divalproex (DEPAKOTE ER) 500 MG 24 hr tablet Take 1 tablet (500 mg total) by mouth at bedtime. (Patient taking differently: Take 500 mg by mouth at bedtime.) 90 tablet 3   Multiple Vitamin (MULTIVITAMIN) tablet Take 1 tablet by mouth daily.     ondansetron (ZOFRAN-ODT) 4 MG disintegrating tablet Take 4 mg by mouth every 8 (eight) hours as needed.   1   Turmeric 500 MG CAPS Take 1 capsule by mouth daily.      chlorthalidone (HYGROTON) 50 MG tablet Take 1 tablet (50 mg total) by mouth daily. (Patient taking differently: Take 50 mg by mouth daily.) 90 tablet 0   potassium chloride SA (KLOR-CON M) 20 MEQ tablet Take 1 tablet (20 mEq total) by mouth daily. (Patient taking differently: Take 20 mEq by mouth at bedtime.) 30 tablet 3   ibuprofen (ADVIL) 600 MG tablet Take 1 tablet (600 mg total) by mouth every 6 (six) hours as needed. (Patient not taking: Reported on 07/04/2021) 30 tablet 0   No facility-administered medications prior to visit.  EXAM:  BP 120/70 (BP Location: Left Arm, Patient Position:  Sitting, Cuff Size: Normal)   Pulse 62   Temp 98.5 F (36.9 C) (Oral)   Ht 5' 3.5" (1.613 m)   Wt 139 lb 9.6 oz (63.3 kg)   SpO2 99%   BMI 24.34 kg/m   Body mass index is 24.34 kg/m. Wt Readings from Last 3 Encounters:  07/04/21 139 lb 9.6 oz (63.3 kg)  06/27/21 141 lb 12.8 oz (64.3 kg)  05/31/21 140 lb (63.5 kg)    Physical Exam: Vital signs reviewed PNT:IRWE is a well-developed well-nourished alert cooperative    who appearsr stated age in no acute distress.  HEENT: normocephalic atraumatic , Eyes: PERRL EOM's full, conjunctiva clear, Nares: paten,t no deformity discharge or tenderness., Ears: no deformity deferrred  done per entMouth: clear NECK: supple without masses, thyromegaly or bruits. CHEST/PULM:  Clear to auscultation and percussion breath sounds equal no wheeze , rales or rhonchi. No chest wall deformities or tenderness. Breast: normal by inspection . No dimpling, discharge, masses, tenderness or discharge . CV: PMI is nondisplaced, S1 S2 no gallops, murmurs, rubs. Peripheral pulses are full without delay.No JVD .  ABDOMEN: Bowel sounds normal nontender  No guard or rebound, no hepato splenomegal no CVA tenderness.  Extremtities:  No clubbing cyanosis or edema, no acute joint swelling or redness no focal atrophy NEURO:  Oriented x3, cranial nerves 3-12 appear to be intact, no obvious focal weakness,gait within normal limits no abnormal reflexes or asymmetrical SKIN: No acute rashes normal turgor, color, no bruising or petechiae. PSYCH: Oriented, good eye contact, no obvious depression anxiety, cognition and judgment appear normal. LN: no cervical axillary inguinal adenopathy  Lab Results  Component Value Date   WBC 3.6 (L) 05/22/2021   HGB 13.6 05/22/2021   HCT 38.8 05/22/2021   PLT 179 05/22/2021   GLUCOSE 85 07/04/2021   CHOL 233 (H) 03/23/2021   TRIG 89.0 03/23/2021   HDL 79.20 03/23/2021   LDLDIRECT 129.1 02/16/2013   LDLCALC 136 (H) 03/23/2021   ALT 24  03/23/2021   AST 25 03/23/2021   NA 138 07/04/2021   K 3.5 07/04/2021   CL 99 07/04/2021   CREATININE 0.85 07/04/2021   BUN 19 07/04/2021   CO2 31 07/04/2021   TSH 1.84 05/23/2020   INR 1.0 01/12/2019   HGBA1C 5.2 05/10/2021    BP Readings from Last 3 Encounters:  07/04/21 120/70  06/27/21 116/78  05/31/21 110/60    Lab plan  reviewed with patient  needs updated potassium level   ASSESSMENT AND PLAN:  Discussed the following assessment and plan:    ICD-10-CM   1. Visit for preventive health examination  Z00.00     2. Essential hypertension  R15 Basic metabolic panel    Magnesium    Magnesium    Basic metabolic panel    3. Meniere's disease of right ear  Q00.86 Basic metabolic panel    Magnesium    Magnesium    Basic metabolic panel    4. Hemochromatosis carrier  P61.9 Basic metabolic panel    Magnesium    Iron, TIBC and Ferritin Panel    Iron, TIBC and Ferritin Panel    Magnesium    Basic metabolic panel    5. Hypokalemia  J09.3 Basic metabolic panel    Magnesium    Magnesium    Basic metabolic panel    6. Medication management  O67.124 Basic metabolic panel    Magnesium  Iron, TIBC and Ferritin Panel    Iron, TIBC and Ferritin Panel    Magnesium    Basic metabolic panel    7. DES exposure in utero  Z91.89     Ok to contact us for refills if not with primary care in [place]  or as indicated  See if can see neuro before  moving to plan medication follow up.  Yearly gyne exam as planned  Cont healthy LS Return in about 1 year (around 07/05/2022) for depending on results.  Patient Care Team: Kalianne Fetting, Standley Brooking, MD as PCP - General Carlean Purl Ofilia Neas, MD (Gastroenterology) Jari Pigg, MD as Consulting Physician (Dermatology) Vicie Mutters, MD as Consulting Physician (Otolaryngology) Viona Gilmore, [facility] as Pharmacist (Pharmacist) Garvin Fila, MD as Consulting Physician (Neurology) Patient Instructions  Good to see you Lab to check potassium level  and  iron level, If all ok then yearly check or as indicated  Glad  you are doing well.   Standley Brooking. Ceira Hoeschen M.D.

## 2021-07-04 ENCOUNTER — Ambulatory Visit (INDEPENDENT_AMBULATORY_CARE_PROVIDER_SITE_OTHER): Payer: HMO | Admitting: Internal Medicine

## 2021-07-04 ENCOUNTER — Encounter: Payer: Self-pay | Admitting: Internal Medicine

## 2021-07-04 ENCOUNTER — Other Ambulatory Visit: Payer: Self-pay | Admitting: Internal Medicine

## 2021-07-04 VITALS — BP 120/70 | HR 62 | Temp 98.5°F | Ht 63.5 in | Wt 139.6 lb

## 2021-07-04 DIAGNOSIS — Z9189 Other specified personal risk factors, not elsewhere classified: Secondary | ICD-10-CM | POA: Diagnosis not present

## 2021-07-04 DIAGNOSIS — Z79899 Other long term (current) drug therapy: Secondary | ICD-10-CM

## 2021-07-04 DIAGNOSIS — H8101 Meniere's disease, right ear: Secondary | ICD-10-CM

## 2021-07-04 DIAGNOSIS — I10 Essential (primary) hypertension: Secondary | ICD-10-CM | POA: Diagnosis not present

## 2021-07-04 DIAGNOSIS — Z148 Genetic carrier of other disease: Secondary | ICD-10-CM | POA: Diagnosis not present

## 2021-07-04 DIAGNOSIS — Z0000 Encounter for general adult medical examination without abnormal findings: Secondary | ICD-10-CM

## 2021-07-04 DIAGNOSIS — E876 Hypokalemia: Secondary | ICD-10-CM

## 2021-07-04 LAB — BASIC METABOLIC PANEL
BUN: 19 mg/dL (ref 6–23)
CO2: 31 mEq/L (ref 19–32)
Calcium: 10.1 mg/dL (ref 8.4–10.5)
Chloride: 99 mEq/L (ref 96–112)
Creatinine, Ser: 0.85 mg/dL (ref 0.40–1.20)
GFR: 69.32 mL/min (ref >60.00)
Glucose, Bld: 85 mg/dL (ref 70–99)
Potassium: 3.5 mEq/L (ref 3.5–5.1)
Sodium: 138 mEq/L (ref 135–145)

## 2021-07-04 LAB — MAGNESIUM: Magnesium: 1.9 mg/dL (ref 1.5–2.5)

## 2021-07-04 NOTE — Patient Instructions (Signed)
Good to see you Lab to check potassium level  and iron level, If all ok then yearly check or as indicated  Glad  you are doing well.

## 2021-07-05 LAB — IRON,TIBC AND FERRITIN PANEL
%SAT: 31 % (calc) (ref 16–45)
Ferritin: 25 ng/mL (ref 16–288)
Iron: 124 ug/dL (ref 45–160)
TIBC: 403 mcg/dL (calc) (ref 250–450)

## 2021-07-05 MED ORDER — POTASSIUM CHLORIDE CRYS ER 20 MEQ PO TBCR: 20.0000 meq | EXTENDED_RELEASE_TABLET | Freq: Every day | ORAL | 3 refills | Status: DC

## 2021-07-05 NOTE — Telephone Encounter (Signed)
Noted  

## 2021-07-05 NOTE — Telephone Encounter (Signed)
Rx sent to the pharmacy.

## 2021-07-05 NOTE — Progress Notes (Signed)
Results normal ; potassium low normal  continue same medications.

## 2021-07-16 NOTE — Telephone Encounter (Signed)
Left detailed message for patient and to call the office if she has any further questions

## 2021-07-17 NOTE — Progress Notes (Signed)
I, Donna Warren, LAT, ATC, am serving as scribe for Dr. [name].  Donna Warren is a 71 y.o. female who presents to [place] at [facility] today for R post knee pain.  She was last seen by Dr. Georgina Snell on 06/27/21 for f/u of L foot and R thumb pain.  Today, pt reports she is in the process of moving. A week ago she noticed the pain in her R knee w/ no MOI. Pt locates pain to the posterior aspect of the her knee, feels like she "pulled" something and an overall pain within the R knee joint, and pain ito the proximal aspect of the R calf.  Pt c/o increased stiffness in the morning, w/ difficulty flexing her R knee  R knee swelling: no Mechanical symptoms: no Aggravating factors: up stairs, Treatments tried: ice, knee compression sleeve, IBU, Tylenol   Pertinent review of systems: No fevers or chills  Relevant historical information: Hypertension.  Mnire's disease.   Exam:  BP (!) 110/58   Pulse 85   Ht 5' 3.5" (1.613 m)   Wt 140 lb 12.8 oz (63.9 kg)   SpO2 98%   BMI 24.55 kg/m  General: Well Developed, well nourished, and in no acute distress.   MSK: Right knee mild effusion.  Normal-appearing otherwise. Range of motion 0-120 degrees. Tender palpation lateral joint line and posterior lateral joint line. Stable ligamentous exam. Intact strength.    Lab and Radiology Results  Procedure: Real-time Ultrasound Guided Injection of the right knee superior lateral patellar space Device: Philips Affiniti 50G Images permanently stored and available for review in PACS Ultrasound evaluation prior to injection reveals mild joint effusion.  Degenerative appearing lateral and medial meniscus.  No significant Baker's cyst visible. Verbal informed consent obtained.  Discussed risks and benefits of procedure. Warned about infection, bleeding, hyperglycemia damage to structures among others. Patient expresses understanding and agreement Time-out conducted.   Noted no  overlying erythema, induration, or other signs of local infection.   Skin prepped in a sterile fashion.   Local anesthesia: Topical Ethyl chloride.   With sterile technique and under real time ultrasound guidance: 40 mg of Kenalog and 2 mL of Marcaine injected into knee joint. Fluid seen entering the joint capsule.   Completed without difficulty   Pain immediately resolved suggesting accurate placement of the medication.   Advised to call if fevers/chills, erythema, induration, drainage, or persistent bleeding.   Images permanently stored and available for review in the ultrasound unit.  Impression: Technically successful ultrasound guided injection.    X-ray images right knee obtained today personally and independently interpreted Mild medial patellofemoral DJD.  No acute fractures are present. Await formal radiology review     Assessment and Plan: 71 y.o. female with right knee pain and discomfort thought to be due to exacerbation of DJD.  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  We discussed options.  Plan for steroid injection today along with Voltaren gel and compressive knee sleeve.  We also discussed medication safety with prescription strength doses of oral ibuprofen.  Of note she is moving to [place] for 1 year starting tomorrow which will obviously limit our ability to provide good follow-up.  Happy to coordinate care and communicate through phone call or MyChart however if she does need further care it may be beneficial for her to establish care with an orthopedic surgeon or primary care sports medicine doctor in [facility] where  she will be living for the next year.  PDMP not reviewed this encounter. Orders Placed This Encounter  Procedures   [place] LIMITED JOINT SPACE STRUCTURES LOW LEFT(NO LINKED CHARGES)    Order Specific Question:   Reason for Exam (SYMPTOM  OR DIAGNOSIS REQUIRED)    Answer:   L knee pain    Order Specific  Question:   Preferred imaging location?    Answer:   [facility]   DG Knee AP/LAT W/Sun[place] Right    Standing Status:   Future    Number of Occurrences:   1    Standing Expiration Date:   08/17/2021    Order Specific Question:   Reason for Exam (SYMPTOM  OR DIAGNOSIS REQUIRED)    Answer:   right knee pain    Order Specific Question:   Preferred imaging location?    Answer:   [facility]   No orders of the defined types were placed in this encounter.    Discussed warning signs or symptoms. Please see discharge instructions. Patient expresses understanding.   The above documentation has been reviewed and is accurate and complete [name], M.D.

## 2021-07-18 ENCOUNTER — Ambulatory Visit: Payer: Self-pay

## 2021-07-18 ENCOUNTER — Ambulatory Visit (INDEPENDENT_AMBULATORY_CARE_PROVIDER_SITE_OTHER): Payer: PPO

## 2021-07-18 ENCOUNTER — Ambulatory Visit (INDEPENDENT_AMBULATORY_CARE_PROVIDER_SITE_OTHER): Payer: PPO | Admitting: Family Medicine

## 2021-07-18 VITALS — BP 110/58 | HR 85 | Ht 63.5 in | Wt 140.8 lb

## 2021-07-18 DIAGNOSIS — M25562 Pain in left knee: Secondary | ICD-10-CM

## 2021-07-18 DIAGNOSIS — M25561 Pain in right knee: Secondary | ICD-10-CM | POA: Diagnosis not present

## 2021-07-18 DIAGNOSIS — M1711 Unilateral primary osteoarthritis, right knee: Secondary | ICD-10-CM | POA: Diagnosis not present

## 2021-07-18 NOTE — Patient Instructions (Addendum)
Thank you for coming in today.   Please get an Xray today before you leave   You received a steroid injection in your right knee today. Seek immediate medical attention if the joint becomes red, extremely painful, or is oozing fluid.   Please use Voltaren gel (Generic Diclofenac Gel) up to 4x daily for pain as needed.  This is available over-the-counter as both the name brand Voltaren gel and the generic diclofenac gel.   I recommend you obtained a compression sleeve to help with your joint problems. There are many options on the market however I recommend obtaining a Full Knee Body Helix compression sleeve.  You can find information (including how to appropriate measure yourself for sizing) can be found at www.Body http://www.lambert.com/.  Many of these products are health savings account (HSA) eligible.  You can use the compression sleeve at any time throughout the day but is most important to use while being active as well as for 2 hours post-activity.   It is appropriate to ice following activity with the compression sleeve in place.   Check back as needed  Safe travels!

## 2021-07-19 NOTE — Progress Notes (Signed)
Right knee x-ray shows some mild arthritis changes.

## 2021-07-26 ENCOUNTER — Encounter: Payer: Self-pay | Admitting: Family Medicine

## 2021-07-31 DIAGNOSIS — M25561 Pain in right knee: Secondary | ICD-10-CM | POA: Diagnosis not present

## 2021-10-23 ENCOUNTER — Other Ambulatory Visit: Payer: Self-pay | Admitting: Internal Medicine

## 2021-11-08 ENCOUNTER — Ambulatory Visit: Payer: Medicare Other | Admitting: Neurology

## 2021-11-08 VITALS — BP 106/65 | HR 66 | Ht 64.0 in | Wt 141.0 lb

## 2021-11-08 DIAGNOSIS — G40109 Localization-related (focal) (partial) symptomatic epilepsy and epileptic syndromes with simple partial seizures, not intractable, without status epilepticus: Secondary | ICD-10-CM

## 2021-11-08 DIAGNOSIS — R9401 Abnormal electroencephalogram [EEG]: Secondary | ICD-10-CM | POA: Diagnosis not present

## 2021-11-08 MED ORDER — DIVALPROEX SODIUM ER 500 MG PO TB24: 500.0000 mg | ORAL_TABLET | Freq: Every day | ORAL | 3 refills | Status: AC

## 2021-11-08 NOTE — Patient Instructions (Signed)
I had a long discussion with the patient regarding her episode of transient speech difficulties and right upper extremity paresthesias suggestive of possible partial seizures versus complicated migraine.  She seems to be doing well on Depakote ER 500 mg daily.  If she continues to do well next follow-up visit in a year  may consider tapering and stopping.

## 2021-11-08 NOTE — Progress Notes (Signed)
[facility] [address] [place]. [place] [address] [phone]       OFFICE FOLLOW UP NOTE  Donna Warren Date of Birth:  05/15/1950 Medical Record Number:  [id number]   Reason for Referral:  hospital stroke follow up    CHIEF COMPLAINT:  Chief Complaint  Patient presents with   Follow-up    Rm 17, alone     HPI: Initial visit 02/24/2019 Donna Macho, NP ) Donna Warren being seen today for in office hospital follow-up regarding likely focal seizures versus complicated migraine on 21/30/8657.  History obtained from patient and chart review. Reviewed all radiology images and labs personally.  Donna Warren is a 71 y.o. female 71 y.o. female who presented on 01/12/2019 with transient speech difficulties and right hand paresthesias.  Evaluated by stroke team and Dr. Leonie Warren with completion of stroke work-up and recurrent episodes possibly focal seizures versus complicated migraine less likely stroke or TIA.  CT head and MRI brain negative.  CTA head/neck mild arthrosclerotic changes in cavernous internal carotids bilaterally.  2D echo unremarkable.  COVID-19 negative. EEG findings are c/w left temporal region dysfunction. She was started on Depakote '500mg'$  ER daily and recommended repeat EEG at follow-up visit.  Recommend initiating aspirin 81 mg daily for stroke prevention.  HTN stable and recommended continuation of home dose chlorthalidone.  LDL 185 and recommended follow-up with PCP for possibly initiating statin therapy -was not done during admission due to no evidence of stroke and low suspicion for TIA.  No evidence or history of DM with A1c 5.2.  No other stroke risk factors.  No prior history of stroke.  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  Discharged home in stable condition without therapy needs.  Donna Warren is a 71 year old female who is being seen today for hospital follow-up.   She has been doing well since discharge without reoccurring transient speech difficulties or right hand paresthesias.  She has continued on Depakote ER 500 mg daily but does have concerns regarding possible side effects including dizziness and bowel movement changes.  She does have underlying history of Mnire's disease but feels as though dizziness since starting Depakote different from Mnire's dizziness.  She has not had follow-up with otolaryngology provider Dr. Thornell Warren since discharge.  She continues on aspirin 81 mg daily without bleeding or bruising.  Blood pressure today 118/60.  She does question past year short-term memory loss with concerns of possibility of dementia or Alzheimer's and questions possible need of further testing.  She is currently working as a Education officer, museum running a Public librarian.  She does endorse increased stressors especially since COVID-19 pandemic and is a "type A" person placing increased pressure and stress on herself.  She was recently started on citalopram by PCP with some benefit.  Examples of short-term memory loss includes being told something and at times will have difficulty remembering.  She is able to maintain ADLs and all IADLs without difficulty.  No further concerns at this time.  Update 04/27/2019 : Donna Warren is seen upon her request today.  She states her main concern today is she is having dizziness but she states she has Mnire's disease which is episodic and has had dizziness in the past.  She read some of the side effects of Depakote which she is taking and wonders if it may be causing dizziness.  She describes this as feeling of slight imbalance which  is intermittent.  She denies true vertigo or significant gait or balance problems.  She is currently on Depakote ER 500 mg daily and states it is working well since she has had no further episodes of recurrent complicated migraine or seizure-like episode.  She was seen recently by my nurse practitioner  but feels she needs to see a physician.  She has no new complaints.  She remains on aspirin which is tolerating well.  Blood pressures well controlled today it is 108/72.  She had a repeat EEG on 03/04/2019 which was normal. Update 10/27/2019 : She returns for follow-up after last visit 6 months ago.  She states she is doing well.  She has had no further episodes of dizziness, numbness, seizure-like activity her typical migraines.  She is tolerating Depakote ER 500 mg daily without significant side effects.  Valproic acid level on 09/03/2019 was 36.1.  She does have Mnire's disease in the head recently undergone vestibular study.  She is still has some occasional mild dizziness but it is not as bothersome and she is fully functional.  She has had no new health issues.  She has no complaints today. Update 11/08/2020: She returns for follow-up after last visit a year ago.  She continues to do well and has not had any further recurrent episodes of speech difficulties, right arm numbness or seizure-like activity or any migraine headache like episodes.  Main complaints continues to be intermittent episodes of dizziness and vertigo and she has been diagnosed with Mnire's disease and has also noticed decreased hearing on the right.  She is seeing Dr. Hermina Warren, ENT at [facility] who plans to do cochlear implant and also surgery for Mnire's patient is not made up her mind yet and is thinking about it.  She is tolerating Depakote ER 500 mg daily quite well without any side effects.  She has no complaints today. Update 11/08/2021 : She returns for follow-up after last visit 1 year ago.  She continues to do well and has not had any further episodes of recurrent speech difficulties or right seizure-like activity of migraine headaches.  He remains on Depakote ER 500 mg daily which she is tolerating well without any side effects.  She plans to move to [place] for a year soon.  She states that her Meneire`s is also  doing well since Dr. Thornell Warren increased the dose of chlorthalidone to 50 mg which is helping a lot.  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)   PMB (postmenopausal bleeding)    Seizure disorder [facility])    neurologist--- dr Donna Warren---  dx 12/ 2020 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;  (05-21-2021 pt stated last seizure 2020)    PSH:  Past Surgical History:  Procedure Laterality Date  BREAST CYST EXCISION Right Ryan   COLONOSCOPY  03/20/2018   DILATATION & CURETTAGE/HYSTEROSCOPY WITH MYOSURE N/A 05/22/2021   Procedure: DILATATION & CURETTAGE/HYSTEROSCOPY WITH MYOSURE;  Surgeon: Nunzio Cobbs, MD;  Location: [facility];  Service: Gynecology;  Laterality: N/A;   EXCISION VAGINAL CYST  1970    Social History:  Social History   Socioeconomic History   Marital status: Married    Spouse name: Sandria Bales   Number of children: 2   Years of education: Not on file   Highest education level: Not on file  Occupational History   Not on file  Tobacco Use   Smoking status: Never   Smokeless tobacco: Never  Vaping Use   Vaping Use: Never used  Substance and Sexual Activity   Alcohol use: Yes    Alcohol/week: 1.0 - 2.0 standard drink of alcohol    Types: 1 - 2 Glasses of wine per week   Drug use: Never   Sexual  activity: Yes    Birth control/protection: Post-menopausal  Other Topics Concern   Not on file  Social History Narrative   Living alone til 2023, husband works out of town   2 daughters   Husband a Location manager    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  Travels    Drinks 1 cup of caffeine daily   Sleep  Often disrupted.    Alcohol about once a week.   Hof 2 pet dogs   Right Handed   Social Determinants of Health   Financial Resource Strain: Low Risk  (10/18/2019)   Overall Financial Resource Strain (CARDIA)    Difficulty of Paying Living Expenses: Not hard at all  Food Insecurity: Not on file  Transportation Needs: No Transportation Needs (10/18/2019)   PRAPARE - Hydrologist (Medical): No    Lack of Transportation (Non-Medical): No  Physical Activity: Not on file  Stress: Not on file  Social Connections: Not on file  Intimate Partner Violence: Not on file    Family History:  Family History  Problem Relation Age of Onset   Colon cancer Father        dx'd late to mid 58's   Other Father        tinnitus   Heart attack Father    Depression Mother    Anxiety disorder Mother    Dementia Mother    Other Mother        high calcium   Hearing loss Mother        death   Pulmonary disease Mother    Stroke Other        silent   Other Brother        elevated LFTs   Healthy Daughter    Healthy Daughter    Colon polyps Neg Hx    Esophageal cancer Neg Hx    Rectal cancer Neg Hx    Stomach cancer Neg Hx    Breast cancer Neg Hx     Medications:   Current Outpatient Medications on File Prior to Visit  Medication Sig Dispense Refill   aspirin EC 81 MG tablet Take 81 mg by mouth daily.     Calcium Carb-Cholecalciferol 600-800 MG-UNIT TABS Take 1 tablet by mouth in the morning and at bedtime.      chlorthalidone (HYGROTON) 50 MG tablet TAKE 1 TABLET BY MOUTH EVERY DAY 90 tablet 0  diazepam (VALIUM) 2 MG tablet Take 2 mg by mouth as  needed (for Meniere's flare up).     ibuprofen (ADVIL) 600 MG tablet Take 1 tablet (600 mg total) by mouth every 6 (six) hours as needed. 30 tablet 0   Multiple Vitamin (MULTIVITAMIN) tablet Take 1 tablet by mouth daily.     ondansetron (ZOFRAN-ODT) 4 MG disintegrating tablet Take 4 mg by mouth every 8 (eight) hours as needed.   1   potassium chloride SA (KLOR-CON M) 20 MEQ tablet Take 1 tablet (20 mEq total) by mouth daily. 30 tablet 3   Turmeric 500 MG CAPS Take 1 capsule by mouth daily.      No current facility-administered medications on file prior to visit.    Allergies:   Allergies  Allergen Reactions   Penicillins Rash                Physical Exam  Vitals:   11/08/21 1254  BP: 106/65  Pulse: 66  Weight: 141 lb (64 kg)  Height: '5\' 4"'$  (1.626 m)   Body mass index is 24.2 kg/m. No results found.   General: Frail pleasant middle-age Caucasian female, seated, in no evident distress Head: head normocephalic and atraumatic.   Neck: supple with no carotid or supraclavicular bruits Cardiovascular: regular rate and rhythm, no murmurs Musculoskeletal: no deformity Skin:  no rash/petichiae Vascular:  Normal pulses all extremities   Neurologic Exam Mental Status: Awake and fully alert.   Normal speech and language oriented to place and time. Recent and remote memory intact. Attention span, concentration and fund of knowledge appropriate. Mood and affect appropriate.  Cranial Nerves: Fundoscopic exam not done.  Pupils equal, briskly reactive to light. Extraocular movements full without nystagmus. Visual fields full to confrontation. Hearing intact. Facial sensation intact. Face, tongue, palate moves normally and symmetrically.  Motor: Normal bulk and tone. Normal strength in all tested extremity muscles. Sensory.: intact to touch , pinprick , position and vibratory sensation.  Coordination: Rapid alternating movements normal in all extremities. Finger-to-nose and heel-to-shin  performed accurately bilaterally. Gait and Station: Arises from chair without difficulty. Stance is normal. Gait demonstrates normal stride length and balance Reflexes: 1+ and symmetric. Toes downgoing.       Diagnostic Data (Labs, Imaging, Testing)  CT HEAD WO CONTRAST 01/12/2019 IMPRESSION: Normal head CT.  CT ANGIO HEAD W OR WO CONTRAST CT ANGIO NECK W OR WO CONTRAST 01/12/2019 IMPRESSION: 1. Normal variant CTA Circle of Willis without significant proximal stenosis, aneurysm, or branch vessel occlusion. 2. Mild atherosclerotic changes within the cavernous internal carotid arteries bilaterally without significant stenosis.  MR BRAIN WO CONTRAST 01/12/2019 IMPRESSION: Negative brain MRI  ECHOCARDIOGRAM 01/12/2019 IMPRESSIONS   1. Left ventricular ejection fraction, by visual estimation, is 55 to  60%. The left ventricle has normal function. There is no left ventricular  hypertrophy.   2. The left ventricle has no regional wall motion abnormalities.   3. Global right ventricle has normal systolic function.The right  ventricular size is normal. No increase in right ventricular wall  thickness.   4. Left atrial size was normal.   5. Right atrial size was normal.   6. The mitral valve is normal in structure. No evidence of mitral valve  regurgitation.   7. The tricuspid valve is normal in structure. Tricuspid valve  regurgitation is mild.   8. The aortic valve is normal in structure. Aortic valve regurgitation is  not visualized.   9. The pulmonic valve was grossly normal.  Pulmonic valve regurgitation is  trivial.  10. Normal pulmonary artery systolic pressure.  11. The inferior vena cava is normal in size with greater than 50%  respiratory variability, suggesting right atrial pressure of 3 mmHg.   EEG adult 01/13/2019 IMPRESSION: This study is suggestive of non specific cortical dysfunction in the left temporal region. No seizures or definite epileptiform  discharges were seen throughout the recording. However, only wakefulness and drowsiness were recorded. If suspicion for interictal activity remains a concern, a prolonged study including sleep can be considered.      ASSESSMENT: AVIANAH PELLMAN is a 71 y.o. year old female presented with transient speech difficulties and right hand paresthesias on 01/12/2019 likely focal seizures versus complicated migraine and initiated Depakote 500 mg ER daily.  EEG consistent with left temporal region dysfunction. Vascular risk factors include HTN and HLD.  She has been doing well since discharge in December 2020 without reoccurring transient speech or right hand symptoms and no breakthru events since starting Depakote  PLAN:  II had a long discussion with the patient regarding her episode of transient speech difficulties and right upper extremity paresthesias suggestive of possible partial seizures versus complicated migraine.  She seems to be doing well on Depakote ER 500 mg daily.  If she continues to do well next follow-up visit in a year  may consider tapering and stopping. Greater than 50% of time during this 35 minute visit was spent on counseling, explanation of diagnosis of focal seizure versus complicated migraine,  , discussion regarding possible side effects of Depakote,planning of further management along with potential future management, and discussion with patient answered all questions to [place], [facility] [address] [place], [place] [address]  Phone [phone] Fax [phone] Note: This document was prepared with digital dictation and possible smart phrase technology. Any transcriptional errors that result from this process are unintentional.

## 2021-11-26 ENCOUNTER — Encounter: Payer: Self-pay | Admitting: Internal Medicine

## 2021-12-08 ENCOUNTER — Other Ambulatory Visit: Payer: Self-pay | Admitting: Internal Medicine

## 2021-12-08 DIAGNOSIS — E876 Hypokalemia: Secondary | ICD-10-CM

## 2022-01-23 ENCOUNTER — Other Ambulatory Visit: Payer: Self-pay | Admitting: Internal Medicine

## 2022-03-06 ENCOUNTER — Other Ambulatory Visit: Payer: Self-pay | Admitting: Internal Medicine

## 2022-03-06 DIAGNOSIS — E876 Hypokalemia: Secondary | ICD-10-CM

## 2022-04-10 ENCOUNTER — Encounter: Payer: Self-pay | Admitting: Neurology

## 2022-04-10 ENCOUNTER — Telehealth: Payer: Self-pay | Admitting: Neurology

## 2022-04-10 NOTE — Telephone Encounter (Signed)
LVM, sent mychart msg and cx letter informing pt of r/s for 10/14 appt- MD vacation.

## 2022-04-21 ENCOUNTER — Other Ambulatory Visit: Payer: Self-pay | Admitting: Internal Medicine

## 2022-06-06 ENCOUNTER — Other Ambulatory Visit: Payer: Self-pay | Admitting: Internal Medicine

## 2022-06-06 DIAGNOSIS — E876 Hypokalemia: Secondary | ICD-10-CM

## 2022-07-11 ENCOUNTER — Other Ambulatory Visit: Payer: Self-pay | Admitting: Family

## 2022-07-11 DIAGNOSIS — E876 Hypokalemia: Secondary | ICD-10-CM

## 2022-11-04 ENCOUNTER — Ambulatory Visit: Payer: Medicare Other | Admitting: Neurology

## 2022-11-18 ENCOUNTER — Ambulatory Visit: Payer: Medicare (Managed Care) | Admitting: Neurology

## 2023-03-05 ENCOUNTER — Encounter: Payer: Self-pay | Admitting: Internal Medicine
